# Patient Record
Sex: Female | Born: 1955 | Race: White | Hispanic: No | Smoking: Never smoker
Health system: Southern US, Community
[De-identification: ages and names within clinical notes are randomized; demographics above are authoritative.]

## PROBLEM LIST (undated history)

## (undated) DIAGNOSIS — Z9884 Bariatric surgery status: Secondary | ICD-10-CM

## (undated) DIAGNOSIS — Z8719 Personal history of other diseases of the digestive system: Secondary | ICD-10-CM

## (undated) DIAGNOSIS — N200 Calculus of kidney: Secondary | ICD-10-CM

## (undated) DIAGNOSIS — K219 Gastro-esophageal reflux disease without esophagitis: Secondary | ICD-10-CM

## (undated) DIAGNOSIS — M1990 Unspecified osteoarthritis, unspecified site: Secondary | ICD-10-CM

## (undated) DIAGNOSIS — E039 Hypothyroidism, unspecified: Secondary | ICD-10-CM

## (undated) DIAGNOSIS — Z87442 Personal history of urinary calculi: Secondary | ICD-10-CM

## (undated) DIAGNOSIS — J309 Allergic rhinitis, unspecified: Secondary | ICD-10-CM

## (undated) DIAGNOSIS — Z973 Presence of spectacles and contact lenses: Secondary | ICD-10-CM

## (undated) DIAGNOSIS — J45909 Unspecified asthma, uncomplicated: Secondary | ICD-10-CM

## (undated) DIAGNOSIS — J069 Acute upper respiratory infection, unspecified: Secondary | ICD-10-CM

## (undated) DIAGNOSIS — G43909 Migraine, unspecified, not intractable, without status migrainosus: Secondary | ICD-10-CM

## (undated) DIAGNOSIS — Z9889 Other specified postprocedural states: Secondary | ICD-10-CM

## (undated) HISTORY — PX: TONSILLECTOMY: SUR1361

## (undated) HISTORY — PX: LAPAROSCOPIC GASTRIC RESTRICTIVE DUODENAL PROCEDURE (DUODENAL SWITCH): SHX6667

## (undated) HISTORY — PX: DILATION AND CURETTAGE OF UTERUS: SHX78

## (undated) HISTORY — PX: ANKLE ARTHROSCOPY: SUR85

## (undated) HISTORY — PX: CARPAL TUNNEL RELEASE: SHX101

## (undated) HISTORY — PX: TOTAL HIP ARTHROPLASTY: SHX124

## (undated) HISTORY — DX: Acute upper respiratory infection, unspecified: J06.9

## (undated) HISTORY — PX: BREAST SURGERY: SHX581

---

## 1994-01-21 HISTORY — PX: LAPAROSCOPIC CHOLECYSTECTOMY: SUR755

## 1997-09-05 ENCOUNTER — Other Ambulatory Visit: Admission: RE | Admit: 1997-09-05 | Discharge: 1997-09-05 | Payer: Self-pay | Admitting: *Deleted

## 1997-12-16 ENCOUNTER — Ambulatory Visit (HOSPITAL_COMMUNITY): Admission: RE | Admit: 1997-12-16 | Discharge: 1997-12-16 | Payer: Self-pay | Admitting: *Deleted

## 1997-12-16 ENCOUNTER — Encounter: Payer: Self-pay | Admitting: *Deleted

## 1998-11-23 ENCOUNTER — Encounter: Payer: Self-pay | Admitting: *Deleted

## 1998-11-23 ENCOUNTER — Ambulatory Visit (HOSPITAL_COMMUNITY): Admission: RE | Admit: 1998-11-23 | Discharge: 1998-11-23 | Payer: Self-pay | Admitting: *Deleted

## 1998-11-28 ENCOUNTER — Other Ambulatory Visit: Admission: RE | Admit: 1998-11-28 | Discharge: 1998-11-28 | Payer: Self-pay | Admitting: *Deleted

## 1998-12-18 ENCOUNTER — Encounter (INDEPENDENT_AMBULATORY_CARE_PROVIDER_SITE_OTHER): Payer: Self-pay

## 1998-12-18 ENCOUNTER — Other Ambulatory Visit: Admission: RE | Admit: 1998-12-18 | Discharge: 1998-12-18 | Payer: Self-pay | Admitting: *Deleted

## 1999-12-19 ENCOUNTER — Inpatient Hospital Stay (HOSPITAL_COMMUNITY): Admission: EM | Admit: 1999-12-19 | Discharge: 1999-12-20 | Payer: Self-pay | Admitting: Emergency Medicine

## 1999-12-19 ENCOUNTER — Encounter: Payer: Self-pay | Admitting: Emergency Medicine

## 2000-01-17 ENCOUNTER — Other Ambulatory Visit: Admission: RE | Admit: 2000-01-17 | Discharge: 2000-01-17 | Payer: Self-pay | Admitting: *Deleted

## 2000-12-04 ENCOUNTER — Encounter: Admission: RE | Admit: 2000-12-04 | Discharge: 2000-12-04 | Payer: Self-pay | Admitting: *Deleted

## 2001-01-16 ENCOUNTER — Encounter: Payer: Self-pay | Admitting: *Deleted

## 2001-01-16 ENCOUNTER — Ambulatory Visit (HOSPITAL_COMMUNITY): Admission: RE | Admit: 2001-01-16 | Discharge: 2001-01-16 | Payer: Self-pay | Admitting: *Deleted

## 2001-01-20 ENCOUNTER — Other Ambulatory Visit: Admission: RE | Admit: 2001-01-20 | Discharge: 2001-01-20 | Payer: Self-pay | Admitting: *Deleted

## 2001-01-28 ENCOUNTER — Encounter: Payer: Self-pay | Admitting: Family Medicine

## 2001-01-28 ENCOUNTER — Ambulatory Visit (HOSPITAL_COMMUNITY): Admission: RE | Admit: 2001-01-28 | Discharge: 2001-01-28 | Payer: Self-pay | Admitting: Family Medicine

## 2001-11-24 ENCOUNTER — Encounter: Admission: RE | Admit: 2001-11-24 | Discharge: 2002-02-22 | Payer: Self-pay | Admitting: Family Medicine

## 2002-01-27 ENCOUNTER — Ambulatory Visit (HOSPITAL_BASED_OUTPATIENT_CLINIC_OR_DEPARTMENT_OTHER): Admission: RE | Admit: 2002-01-27 | Discharge: 2002-01-27 | Payer: Self-pay | Admitting: Orthopedic Surgery

## 2002-04-22 ENCOUNTER — Other Ambulatory Visit: Admission: RE | Admit: 2002-04-22 | Discharge: 2002-04-22 | Payer: Self-pay | Admitting: *Deleted

## 2002-05-03 ENCOUNTER — Encounter: Admission: RE | Admit: 2002-05-03 | Discharge: 2002-05-03 | Payer: Self-pay | Admitting: Allergy and Immunology

## 2002-06-30 ENCOUNTER — Encounter: Payer: Self-pay | Admitting: *Deleted

## 2002-06-30 ENCOUNTER — Ambulatory Visit (HOSPITAL_COMMUNITY): Admission: RE | Admit: 2002-06-30 | Discharge: 2002-06-30 | Payer: Self-pay | Admitting: *Deleted

## 2002-07-04 ENCOUNTER — Emergency Department (HOSPITAL_COMMUNITY): Admission: EM | Admit: 2002-07-04 | Discharge: 2002-07-04 | Payer: Self-pay | Admitting: Emergency Medicine

## 2003-08-23 ENCOUNTER — Ambulatory Visit (HOSPITAL_BASED_OUTPATIENT_CLINIC_OR_DEPARTMENT_OTHER): Admission: RE | Admit: 2003-08-23 | Discharge: 2003-08-23 | Payer: Self-pay | Admitting: Orthopedic Surgery

## 2004-04-15 ENCOUNTER — Ambulatory Visit (HOSPITAL_COMMUNITY): Admission: RE | Admit: 2004-04-15 | Discharge: 2004-04-15 | Payer: Self-pay | Admitting: Family Medicine

## 2004-07-11 ENCOUNTER — Ambulatory Visit (HOSPITAL_COMMUNITY): Admission: RE | Admit: 2004-07-11 | Discharge: 2004-07-11 | Payer: Self-pay | Admitting: *Deleted

## 2004-07-19 ENCOUNTER — Other Ambulatory Visit: Admission: RE | Admit: 2004-07-19 | Discharge: 2004-07-19 | Payer: Self-pay | Admitting: *Deleted

## 2005-05-31 ENCOUNTER — Encounter: Admission: RE | Admit: 2005-05-31 | Discharge: 2005-05-31 | Payer: Self-pay | Admitting: Orthopedic Surgery

## 2005-07-16 ENCOUNTER — Ambulatory Visit (HOSPITAL_COMMUNITY): Admission: RE | Admit: 2005-07-16 | Discharge: 2005-07-16 | Payer: Self-pay | Admitting: *Deleted

## 2005-08-29 ENCOUNTER — Other Ambulatory Visit: Admission: RE | Admit: 2005-08-29 | Discharge: 2005-08-29 | Payer: Self-pay | Admitting: *Deleted

## 2006-04-02 ENCOUNTER — Inpatient Hospital Stay (HOSPITAL_COMMUNITY): Admission: RE | Admit: 2006-04-02 | Discharge: 2006-04-07 | Payer: Self-pay | Admitting: Orthopedic Surgery

## 2006-07-22 ENCOUNTER — Ambulatory Visit (HOSPITAL_COMMUNITY): Admission: RE | Admit: 2006-07-22 | Discharge: 2006-07-22 | Payer: Self-pay | Admitting: *Deleted

## 2006-09-04 ENCOUNTER — Other Ambulatory Visit: Admission: RE | Admit: 2006-09-04 | Discharge: 2006-09-04 | Payer: Self-pay | Admitting: *Deleted

## 2007-08-07 ENCOUNTER — Ambulatory Visit (HOSPITAL_COMMUNITY): Admission: RE | Admit: 2007-08-07 | Discharge: 2007-08-07 | Payer: Self-pay | Admitting: Gynecology

## 2007-12-02 ENCOUNTER — Other Ambulatory Visit: Admission: RE | Admit: 2007-12-02 | Discharge: 2007-12-02 | Payer: Self-pay | Admitting: Gynecology

## 2008-08-10 ENCOUNTER — Encounter: Admission: RE | Admit: 2008-08-10 | Discharge: 2008-08-10 | Payer: Self-pay | Admitting: Gynecology

## 2009-07-18 ENCOUNTER — Encounter: Admission: RE | Admit: 2009-07-18 | Discharge: 2009-07-18 | Payer: Self-pay | Admitting: Family Medicine

## 2009-08-11 ENCOUNTER — Ambulatory Visit (HOSPITAL_COMMUNITY): Admission: RE | Admit: 2009-08-11 | Discharge: 2009-08-11 | Payer: Self-pay | Admitting: Gynecology

## 2010-02-11 ENCOUNTER — Encounter: Payer: Self-pay | Admitting: Gynecology

## 2010-02-12 ENCOUNTER — Encounter: Payer: Self-pay | Admitting: Gynecology

## 2010-06-08 NOTE — Op Note (Signed)
NAME:  Rachel [name], Rachel [name]                          ACCOUNT NO.:  [id number]   MEDICAL RECORD NO.:  [id number]                   PATIENT TYPE:  AMB   LOCATION:  DSC                                  FACILITY:  [facility]   PHYSICIAN:  Katy Fitch. Naaman Plummer., M.D.          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   OPERATION:  Release of right transverse carpal ligament.   SURGEON:  Katy Fitch. Sypher, M.D.   ASSISTANT:  None.   ANESTHESIA:  0.25% Marcaine and 2% lidocaine palmar block and median nerve  block supplemented by IV sedation.   SUPERVISING ANESTHESIOLOGIST:  Guadalupe Maple, M.D.   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  She was referred for  evaluation and management of hand pain and numbness.  Clinical examination  revealed signs of carpal tunnel syndrome and electrodiagnostic studies  completed by Dr. Johna Roles confirmed significant median neuropathy.   Due to a failed response with nonoperative measures, she was brought to the  operating room at this time for release of her right transverse carpal  ligament.   DESCRIPTION OF PROCEDURE:  Rachel [name] is brought to the operating room and  placed in the supine position upon the operating table.  Following light  sedation, the right arm is prepped with Betadine soap and solution and  sterilely draped.   Following exsanguination of the limb with an Esmarch bandage.  Arterial  tourniquet is inflated to 260 mmHg.  Marcaine 0.25% and 2% lidocaine were  infiltrated into the path of the intended incision.   When anesthesia was satisfactory, the procedure commenced with a short  incision in the line of the line of the ring finger and the palm.  Subcutaneous tissues are carefully divided on the palmar fascia.  This was  split longitudinally through the __________ branch of the median nerve.  These were followed back to transverse carpal ligament which was gently  isolated at the median nerve.  The ligament was then released along its  ulnar border directly off the hook of the hamate extending into the distal  forearm.   This widely opened the carpal canal.  No masses or other predicaments are  noted.   Bleeding points along the margin at the released ligament were  electrocauterized with bipolar current followed by repair of the skin with  intradermal 3-0 Prolene suture.   A compressive dressing applied with volar plaster splint maintaining the  wrist in 5 degrees of dorsiflexion.   For aftercare, Rachel [name] is given a prescription for Percocet 5 mg one or  two tablets p.o. q.4-6h.  p.r.n. pain 20 tablets without refill.   She will return to the office for follow-up in one week or sooner if any  problems.                                               Katy Fitch Naaman Plummer., M.D.    RVS/MEDQ  D:  08/23/2003  T:  08/23/2003  Job:  [id number]

## 2010-06-08 NOTE — Discharge Summary (Signed)
NAMETOMECA, HELM NO.:  [id number]   MEDICAL RECORD NO.:  [id number]          PATIENT TYPE:  INP   LOCATION:  1504                         FACILITY:  [facility]   PHYSICIAN:  Georges Lynch. Gioffre, M.D.DATE OF BIRTH:  1955/07/16   DATE OF ADMISSION:  04/02/2006  DATE OF DISCHARGE:  04/07/2006                               DISCHARGE SUMMARY   ADMISSION DIAGNOSIS:  1. End-stage osteoarthritis of the left hip with __________ loss of      function.  2. Morbid obesity.  3. History of asthma.  4. __________  5. History of reflux disease.  6. History of thyroid disease.   DISCHARGE DIAGNOSES:  1. Left total hip arthroplasty.  2. Postoperative hypokalemia, with p.o. supplements.  3. Postoperative blood loss anemia.  Asymptomatic, allowed to self      correct with p.o. supplements.  No transfusions.  4. Stable history of asthma.  5. Stable hiatal hernia.  6. Stable reflux disease.  7. Stable thyroid disease.  8. Morbid obesity.   HISTORY OF PRESENT ILLNESS:  The patient is a 55 year old female who has  severe end-stage osteoarthritis with near complete destruction of her  left hip joint.  The patient had significant amount of pain with  ambulation and range of motion.  She is unable to do activities of daily  living at this time due to the pain.  She would like to proceed with  total hip arthroplasty.   ALLERGIES:  PSEUDOEPHEDRINE, CODEINE causes nausea.  PERCOCET causing  itching.  She can barely tolerate VICODIN.   MEDICATIONS ON ADMISSION:  1. Nexium 40 mg a day.  2. Fexofenadine 80 mg a day.  3. Levothyroxine 100 mcg a day.  4. Diclofenac 75 mg a day.  5. Fluticasone nasal spray 50 mcg twice a day.  6. Omega 3 fish oil.  7. Super B complex.  8. Vitamin E 400 international units a day.  9. Black cohosh.  10.Calcium.   SURGICAL PROCEDURES:  On April 02, 2006 the patient was taken to the OR  by Dr. Worthy Rancher. Assisted by Dr. Lajoyce Corners and Oneida Alar,  PA-C.  Under  general anesthesia, the patient underwent a left DePuy total hip  arthroplasty.  Estimated blood loss was 600 mL.  There were no  complications.  The patient had the following components implanted:  A  size 4 standard offset femoral stem, a size 52 acetabular cup,  size 46  Uni femoral ASR implant, a size +5 tapered sleeve adapter.  The patient  tolerated procedure well and was transferred to the recovery room and  then to the orthopedic floor in good condition.   CONSULTATIONS:  The following routine consults requested:  Physical  therapy, case management, pharmacy.   HOSPITAL COURSE:  On April 02, 2006, the patient was admitted to [facility] under the care of  Dr. Worthy Rancher.  The patient was taken  to the OR where a left total hip arthroplasty was performed without any  complications.  The patient was transferred to recovery room and then to  orthopedic floor  in good condition on IV antibiotics, pain medicines,  DVT prophylaxis and total hip protocol.  The patient then incurred a  total of 4 days postoperative course on the orthopedic floor in which  the patient did develop some asymptomatic postoperative blood loss  anemia, but her vital signs remained stable.  The patient tolerated  physical therapy well and was allowed to self correct without any  transfusions with p.o. supplements.  The patient also developed some  slight postoperative hypokalemia.  This was corrected with p.o.  supplements also.  The patient's vital signs remained stable.  She had  no febrile events.  The patient's wound remained benign for any signs of  infection.  Her leg remained vascularly intact.  The patient had some  difficulty with her physical therapy due to her obesity and difficulty  getting around, but she did progress well to the point where on postop  day #4, she was felt to be orthopneically and medically stable, ready  for discharge home.  The patient was able to  transition from IV  medications well to p.o. medications, and her pain was well controlled  on [employer].  The patient was able to be transferred home with  outpatient home health physical therapy arrangements made.   The patient was discharged in good condition to home.   Routine chemistries on admission found sodium of 146, potassium 4.6,  glucose 97, BUN 15 creatinine 0.83.  Her potassium dropped to 3.3 on  March 16.  She had p.o. supplements and the following day she was 3.8.  CBC on admission found to be WBC 6.2, hemoglobin 13.7, hematocrit of  40.9, and her platelets 259.  On March 15, her  H&H was 9.2 and 27.6.  INR on that day was also 1.4.   EKG on admission was normal sinus rhythm at 67 beats per minute.  Chest  x-ray on admission found no active cardiopulmonary disease.   DISCHARGE INSTRUCTIONS:  1. Activity:  The patient is to maintain 50% weightbearing on left      lower extremity with the use of a walker.  2. Diet:  No restrictions.  3. Wound care:  The patient should change dressing daily.  4. Followup:  The patient needs a followup appointment Dr. Darrelyn Hillock 2      weeks from discharge.  The patient is to call [phone].  5. Home health physical therapy and RN is provided by [facility].   DISCHARGE MEDICATIONS:  1. Mepergan Fortis 1 tablet every 4-6 hours p.r.n. pain.  2. Coumadin 5 mg a day.  3. Robaxin 500 mg 1 tablet every 6 hours muscle spasms.  4. Fexofenadine 180 mg a day.  5. Nexium 40 mg a day.  6. Synthroid 100 mcg a day.  7. Flonase spray, once each nostril twice a day as needed.  8. Calcium with vitamin D once every other day.  9. Vitamin B complex.  10.Albuterol inhaler p.r.n.   The patient's condition upon discharge home is listed improved to good.      Jamelle Rushing, P.A.    ______________________________  Georges Lynch Darrelyn Hillock, M.D.    RWK/MEDQ  D:  05/07/2006  T:  05/07/2006  Job:  [id number]   cc:   Windy Fast A. Darrelyn Hillock, M.D.   Fax: [phone]

## 2010-06-08 NOTE — H&P (Signed)
Rachel [name], Rachel [name] NO.:  [id number]   MEDICAL RECORD NO.:  [id number]         PATIENT TYPE:  LINP   LOCATION:                               FACILITY:  [facility]   PHYSICIAN:  Georges Lynch. Gioffre, M.D.DATE OF BIRTH:  03/27/1955   DATE OF ADMISSION:  04/02/2006  DATE OF DISCHARGE:                              HISTORY & PHYSICAL   CHIEF COMPLAINT:  Painful loss range of motion left hip.   HISTORY OF PRESENT ILLNESS:  The patient is 55 year old female here  today for evaluation and preoperative evaluation for her severe left hip  pain.  It has been progressively worsening over the course of time.  She  has noted loss of range of motion.  She cannot put her socks on.  She  has pain with sitting.  She has pain with walking.  She would like to  proceed with a total hip arthroplasty.  X-rays reveal that she has got  end-stage osteoarthritis of the left hip, bone on bone, with deformity  and cystic and sclerotic changes.   ALLERGIES:  1. PSEUDOEPHEDRINE.  2. CODEINE CAUSES NAUSEA.  3. PERCOCET CAUSING ITCHING.  4. SHE CAN BARELY TOLERATE VICODIN.   CURRENT MEDICATIONS:  1. Nexium 40 mg daily.  2. Fexofenadine 180 mg daily.  3. Levothyroxine 100 mcg daily.  4. Diclofenac 75 mg daily.  5. Fluticasone 50 mcg spray twice daily.  6. Omega three fish oil.  7. Super B complex.  8. Vitamin E 400 international units.  9. Black cohosh.  10.Calcium.   PAST MEDICAL HISTORY:  1. Asthma, last severe attack was a year and half ago.  She has never      been hospitalized.  2. Hiatal hernia.  3. Reflux disease.  4. Morbid obesity.  5. Thyroid disease.  6. End-stage osteoarthritis left hip.   REVIEW OF SYSTEMS:  Positive for occasional asthma attack.  Her last  significant event was a year and half previous.  She does have problems  with reflux, but she is well controlled on Nexium.  She has had her  gallbladder removed for problems in the past.  She denies any other  urinary, cardiovascular, neurologic, hematologic, or endocrine issues.   PAST SURGICAL HISTORY:  1. Cholecystectomy.  2. Tonsillectomy.  3. Left ankle and bilateral carpal tunnel releases in the past without      any complications.   FAMILY MEDICAL HISTORY:  Mother is alive with diabetes and hypertension.  Father is alive with coronary artery disease.   SOCIAL HISTORY:  The patient is married.  She currently drives a school  bus.  She does not smoke or use alcohol.  She has 1 child.  She lives in  a [place] house, 6 steps to the main entrance.   PHYSICAL EXAM:  VITAL SIGNS:  Height is 5 feet 7 inches.  Weight is 284  pounds, blood pressure is 142/88, pulse of 84 and regular, respirations  12, patient is afebrile.  GENERAL:  This is a conscious, alert and appropriate 55 year old female.  She does have significant and lower obesity.  She  does walk with a  significant left-sided limp.  She has difficulty sitting on the exam  table and a chair due to discomfort.  HEENT:  Head was normocephalic.  Pupils equal, round and reactive.  Extraocular movements intact.  Oral buccal mucosa is pink and moist.  NECK:  Supple.  No palpable lymphadenopathy.  Thyroid region was  nontender.  She had excellent range of motion without any discomfort.  CHEST:  Lung sounds were clear and equal bilaterally.  No wheezes,  rales, rhonchi.  ABDOMEN:  Obese, soft, nontender.  Bowel sounds present.  EXTREMITIES:  Upper extremities were symmetrically sized and shape.  She  had excellent range of motion of her shoulders, elbows and wrists.  Motor strength was good at 5/5.  LOWER EXTREMITIES:  Right hip had full extension, flexion up to 100  degrees, 20 degrees internal-external rotation without any discomfort.  Left hip:  She was barely able to extend it fully, flexes it up to about  90 degrees with quite a bit of discomfort.  She has no internal  rotation; external rotation is 10 degrees due to mechanical  and  discomfort in the hip.  Both knees have extension.  She can flex them  back to 100 degrees limited by soft tissue.  She has no instability.  The calves are soft, nontender.  The ankles were symmetrical with good  dorsi plantar flexion.  PERIPHERAL VASCULAR:  Carotid pulses were 2+ with no bruits.  Radial  pulses were 2+.  Dorsalis pedis pulses were 1+.  She had no lower  extremity edema or venostasis changes.  NEURO:  The patient was conscious, alert and appropriate, easy  conversation.  Cranial nerves were grossly intact.  She had no gross  neurologic defects noted.  BREAST, RECTAL AND GU:  Deferred at this time.   IMPRESSION:  1. End-stage osteoarthritis left hip with severe pain, loss of motion.  2. Central obesity.  3. Asthma.  4. Hiatal hernia.  5. Reflux disease.  6. Thyroid disease.   PLAN:  The patient has been evaluated by her primary care physician, who  is Dr. Theresia Lo with [facility] practice.  The patient will undergo  all routine labs and tests prior to having a left total hip arthroplasty  by Dr. Darrelyn Hillock on March 12.  The patient has no other questions.  The  patient would like to proceed.      Jamelle Rushing, P.A.    ______________________________  Georges Lynch Darrelyn Hillock, M.D.    RWK/MEDQ  D:  03/17/2006  T:  03/17/2006  Job:  [id number]

## 2010-06-08 NOTE — Consult Note (Signed)
. [facility]  Patient:    Rachel [name], Rachel [name]                       MRN: [id number] Proc. Date: 12/20/99 Adm. Date:  [id number] Attending:  Lanell Persons                          Consultation Report  CHIEF COMPLAINT:  Chest pain.  HISTORY OF PRESENT ILLNESS:  This is a 55 year old morbidly obese white female with no cardiac history in the past who presents with complaints of substernal chest pain.  She noted the onset of chest pressure yesterday evening, November 28.  She thought that it was like indigestion and took a Tums and went to bed.  She had no problems sleeping during the night, but when she awoke, she developed sharp, shooting fleeting pains across her chest, which would last for seconds and resolve.  This occurred intermittently all day. Around three oclock she then developed some chest pressure which was constant for at least 5-6 hours.  She came to the emergency room for further evaluation.  On arrival EKG was within normal limits.  She was given a sublingual nitroglycerin with questionable mild improvement in her pain.  She had had an episode of chest pain seven years ago and underwent exercise treadmill testing which was negative and was told that she had a hiatal hernia.  She has also complained of some numbness in the back of her head and scalp on the left side.  Of note she fell down the stairs this past Sunday. She has no associated symptoms of nausea, vomiting, diaphoresis, shortness of breath, dizziness, or palpitations.  Cardiac risk factors include morbid obesity.  She does have a family history of coronary disease, but no history of MIs under age 69.  PAST MEDICAL HISTORY:  Significant for gastroesophageal reflux and hypothyroidism.  She is status post cholecystectomy and tonsillectomy.  ALLERGIES:  ASPIRIN.  She has a GI intolerance.  FAMILY HISTORY:  Positive for hypertension.  Her father had an MI at 2.  SOCIAL HISTORY:   She is a school bus driver.  She denies any tobacco or alcohol use.  MEDICATIONS:  Synthroid, Vioxx, Nexium, which she is now on for six months, and Zyrtec q.d.  REVIEW OF SYSTEMS:  As stated in the HPI, otherwise negative.  PHYSICAL EXAMINATION:  VITAL SIGNS:  Blood pressure is 110/60, heart rate 72.  GENERAL:  Generally this is a well-developed, well-nourished, morbidly obese white female in no acute distress.  HEENT:  Pupils are equal, round, reactive to light and accommodation. Extraocular muscles are intact bilaterally.  There is no scleral icterus.  NECK:  Supple without lymphadenopathy, no masses.  LUNGS:  Clear to auscultation bilaterally, no wheezes, rales, or rhonchi.  HEART:  Regular rate and rhythm, no murmurs, rubs, or gallops, normal S1, S2. Carotid obstruction, +2 bilateral bruits.  ABDOMEN:  Soft, nontender, nondistended with active bowel sounds, very obese. No palpable masses, although difficult exam.  No bruits.  EXTREMITIES:  No cyanosis, erythema, or edema.  Good distal pulses bilaterally.  NEUROLOGIC:  Alert and oriented x 3.  LABORATORY DATA:  CPKs 262 and 210 with MBs of 3.2 and 2.3.  Troponins are 0.01 x 2.  EKG shows normal sinus rhythm, no ST/T-wave abnormalities.  ASSESSMENT:  Atypical chest pain with  no significant cardiac risk factors except for family history after age 46 and  morbid obesity.  EKG is unremarkable.  She did fall down the steps earlier this week and she does have some chest wall tenderness to palpation, although her chest pain is somewhat different in quality.  She does have a history of a hiatal hernia with reflux. She had more than five hours of constant chest discomfort with negative enzymes and negative EKG.  Recommend proceeding with GI workup first.  If she continues to have chest pain with all other workup negative, then we will consider adenosine Cardiolite, although very unlikely for cardiac chest pain.  Will plan  on discharging her home.  I will be glad to see her back in the office as an outpatient if she needs further workup. DD:  12/20/99 TD:  12/20/99 Job: [id number]

## 2010-06-08 NOTE — Op Note (Signed)
NAMEENGLAND, GREB NO.:  [id number]   MEDICAL RECORD NO.:  [id number]          PATIENT TYPE:  INP   LOCATION:  1504                         FACILITY:  [facility]   PHYSICIAN:  Georges Lynch. Gioffre, M.D.DATE OF BIRTH:  03/13/1955   DATE OF PROCEDURE:  04/02/2006  DATE OF DISCHARGE:                               OPERATIVE REPORT   OPERATION:  Left total hip arthroplasty utilizing the DePuy system.  I  utilized a size 4 femoral component, it was a Summit femoral component.  The cup was an ASR 52 mm diameter and was a metal-on-metal. The C  tapered head was a +5. The stem was a standard stem.  The femoral stem  with a standard stem.   PROCEDURE:  Under general anesthesia, routine orthopedic prepping and  draping of the left hip was carried out.  The patient was on her right  side left side up.  Note, the patient was quite obese so we took a  painstaking amount of time to pad all areas to avoid any pressure sores.  A posterolateral approach to the hip was carried out after she had 2  grams of IV Ancef.  Bleeders were identified and cauterized.  Self-  retaining retractors were inserted.  Following that, I identified the  greater trochanter and an incision was made along the iliotibial band.  At this time, the self-retaining retractors were advanced.  I then made  an incision in the capsule and did a partial capsulectomy.  I then  dislocated the femoral head.  I utilized the appropriate femoral neck  cutting guide and cut the neck at the appropriate length.  Following  that, I then utilized my box chisel and made an initial opening in the  femoral neck.  I then utilized my appropriate reamers and reamed and  then rasped to a size 4 femoral component.  Following that, we then  reamed the acetabulum up the appropriate diameter for a 52 mm cup.  We  inserted our permanent ASR cup.  We had a nice snug fit of the cup.  Following that, we went through trials with various sizes of  C-tapered  head and finally selected a +5 C tapered head.  At that time, we reduced  the hip and had excellent function of the hip.  We measured our leg  length, once again, and had excellent leg length measurements.  We took  the hip through various ranges of motion and it had good stability.  I  thoroughly irrigated out the hip, reapproximated the soft tissue  structures in the usual fashion.  The skin was closed with metal  staples.  A sterile Neosporin dressing was applied.   SURGEON:  Georges Lynch. Darrelyn Hillock, M.D.   ASSISTANT:  Madlyn Frankel. Charlann Boxer, M.D.  Jamelle Rushing, P.A.-C.           ______________________________  Georges Lynch Darrelyn Hillock, M.D.     RAG/MEDQ  D:  04/03/2006  T:  04/04/2006  Job:  [id number]

## 2010-06-08 NOTE — Op Note (Signed)
NAME:  Rachel [name], DOUGHTY                          ACCOUNT NO.:  [id number]   MEDICAL RECORD NO.:  [id number]                   PATIENT TYPE:  AMB   LOCATION:  DSC                                  FACILITY:  [facility]   PHYSICIAN:  Feliberto Gottron. Turner Daniels, M.D.                DATE OF BIRTH:  05/18/1955   DATE OF PROCEDURE:  DATE OF DISCHARGE:                                 OPERATIVE REPORT   PREOPERATIVE DIAGNOSIS:  Left ankle pain with negative x-ray and MRI scans,  persistent.   POSTOPERATIVE DIAGNOSIS:  Left ankle partial thickness anterior talofibular  ligament tear and anteromedial fibrous bands.   PROCEDURE:  Left ankle arthroscopic removal of anteromedial fibrous band  that was slipping in and out of the joint as well as partial thickness  anterior talofibular ligament tear.   SURGEON:  Dr. Gean Birchwood.   FIRST ASSISTANT:  Berle Mull, P.A.-C.   ANESTHETIC:  General endotracheal.   ESTIMATED BLOOD LOSS:  Minimal.   FLUIDS REPLACED:  800 cc of crystalloid.   INDICATIONS FOR PROCEDURE:  55 year old woman who injured her left ankle,  has been treated conservative with anti-inflammatory medicines, physical  therapy.  X-rays were normal.  MRI scan was normal but has had persistent  pain.  Got good temporary relief from a cortisone injection and now desires  elective arthroscopic evaluation and treatment of her left ankle.   DESCRIPTION OF PROCEDURE:  Patient identified by arm band, taken to the  operating room at [facility]. Appropriate anesthetic monitors  were attached and general endotracheal anesthesia induced with the patient  in the supine position.  Tourniquet applied to the left calf but never used.  The left lower extremity was prepped and draped in the usual sterile fashion  from the toes to the tourniquet.  We began the procedure by using an  anterolateral approach and injecting the ankle joint with 20 cc of 0.5%  Marcaine and epinephrine solution.  Using a  #15 blade, standard anteromedial  and anterolateral peripatellar portals were then made allowing introduction  of the arthroscope to the anterolateral portal, and a 2.9 Gray-White sucker-  shaver to the anteromedial portal.  We immediately identified the  anteromedial fibrous band that was torn and slipping in and out of the  joint, photographed it and then removed it with a 2.9 Gray-White sucker-  shaver.  On the lateral side, we found ATFL partial thickness tear that was  likewise debrided.  We then evaluated the gutters and the posterior  compartment arthroscopically, and  found them to be in good condition along with the articular cartilage.  The  ankle was washed out with normal saline solution.  The arthroscopic  instruments removed, and dressed with Xeroform, 4 x 4 dressings, sponges,  Webril and an Ace wrap applied.  The patient was then awakened and taken to  the recovery room without difficulty,.  Feliberto Gottron. Turner Daniels, M.D.    Ovid Curd  D:  01/27/2002  T:  01/27/2002  Job:  [id number]

## 2010-10-02 ENCOUNTER — Emergency Department (HOSPITAL_COMMUNITY)
Admission: EM | Admit: 2010-10-02 | Discharge: 2010-10-03 | Disposition: A | Discharge status: Home / Self Care | Payer: BC Managed Care – PPO | Attending: Emergency Medicine | Admitting: Emergency Medicine

## 2010-10-02 DIAGNOSIS — E039 Hypothyroidism, unspecified: Secondary | ICD-10-CM | POA: Insufficient documentation

## 2010-10-02 DIAGNOSIS — E669 Obesity, unspecified: Secondary | ICD-10-CM | POA: Insufficient documentation

## 2010-10-02 DIAGNOSIS — R609 Edema, unspecified: Secondary | ICD-10-CM | POA: Insufficient documentation

## 2010-10-02 DIAGNOSIS — Z79899 Other long term (current) drug therapy: Secondary | ICD-10-CM | POA: Insufficient documentation

## 2010-10-02 DIAGNOSIS — R079 Chest pain, unspecified: Secondary | ICD-10-CM | POA: Insufficient documentation

## 2010-10-02 DIAGNOSIS — Z7982 Long term (current) use of aspirin: Secondary | ICD-10-CM | POA: Insufficient documentation

## 2010-10-02 DIAGNOSIS — M25519 Pain in unspecified shoulder: Secondary | ICD-10-CM | POA: Insufficient documentation

## 2010-10-03 ENCOUNTER — Emergency Department (HOSPITAL_COMMUNITY): Payer: BC Managed Care – PPO

## 2010-10-03 LAB — TROPONIN I: Troponin I: <0.3 ng/mL (ref <0.30)

## 2010-10-03 LAB — BASIC METABOLIC PANEL
BUN: 18 mg/dL (ref 6–23)
Calcium: 9.9 mg/dL (ref 8.4–10.5)
Creatinine, Ser: 0.7 mg/dL (ref 0.50–1.10)
GFR calc Af Amer: >60 mL/min (ref >60)
Glucose, Bld: 89 mg/dL (ref 70–99)

## 2010-10-03 LAB — CBC
MCH: 29.8 pg (ref 26.0–34.0)
MCHC: 34.2 g/dL (ref 30.0–36.0)
Platelets: 174 10*3/uL (ref 150–400)
RDW: 13.3 % (ref 11.5–15.5)
WBC: 7.9 10*3/uL (ref 4.0–10.5)

## 2010-10-03 LAB — POCT I-STAT TROPONIN I: Troponin i, poc: 0 ng/mL (ref 0.00–0.08)

## 2012-09-02 ENCOUNTER — Other Ambulatory Visit: Payer: Self-pay | Admitting: Gynecology

## 2012-09-02 DIAGNOSIS — R928 Other abnormal and inconclusive findings on diagnostic imaging of breast: Secondary | ICD-10-CM

## 2012-09-17 ENCOUNTER — Ambulatory Visit
Admission: RE | Admit: 2012-09-17 | Discharge: 2012-09-17 | Disposition: A | Discharge status: Home / Self Care | Payer: BC Managed Care – PPO | Source: Ambulatory Visit | Attending: Gynecology | Admitting: Gynecology

## 2012-09-17 DIAGNOSIS — R928 Other abnormal and inconclusive findings on diagnostic imaging of breast: Secondary | ICD-10-CM

## 2012-09-29 ENCOUNTER — Other Ambulatory Visit: Payer: Self-pay | Admitting: Occupational Medicine

## 2012-09-29 ENCOUNTER — Ambulatory Visit
Admission: RE | Admit: 2012-09-29 | Discharge: 2012-09-29 | Disposition: A | Discharge status: Home / Self Care | Payer: Self-pay | Source: Ambulatory Visit | Attending: Occupational Medicine | Admitting: Occupational Medicine

## 2012-09-29 DIAGNOSIS — T148XXA Other injury of unspecified body region, initial encounter: Secondary | ICD-10-CM

## 2012-09-29 DIAGNOSIS — W19XXXA Unspecified fall, initial encounter: Secondary | ICD-10-CM

## 2012-11-10 ENCOUNTER — Ambulatory Visit
Admission: RE | Admit: 2012-11-10 | Discharge: 2012-11-10 | Disposition: A | Discharge status: Home / Self Care | Payer: Worker's Compensation | Source: Ambulatory Visit | Attending: Orthopedic Surgery | Admitting: Orthopedic Surgery

## 2012-11-10 ENCOUNTER — Ambulatory Visit
Admission: RE | Admit: 2012-11-10 | Discharge: 2012-11-10 | Disposition: A | Discharge status: Home / Self Care | Payer: Self-pay | Source: Ambulatory Visit | Attending: Orthopedic Surgery | Admitting: Orthopedic Surgery

## 2012-11-10 ENCOUNTER — Other Ambulatory Visit: Payer: Self-pay | Admitting: Orthopedic Surgery

## 2012-11-10 DIAGNOSIS — R52 Pain, unspecified: Secondary | ICD-10-CM

## 2012-11-10 DIAGNOSIS — R200 Anesthesia of skin: Secondary | ICD-10-CM

## 2013-01-18 ENCOUNTER — Other Ambulatory Visit: Payer: Self-pay | Admitting: Gynecology

## 2013-01-18 DIAGNOSIS — N6320 Unspecified lump in the left breast, unspecified quadrant: Secondary | ICD-10-CM

## 2013-01-22 ENCOUNTER — Ambulatory Visit
Admission: RE | Admit: 2013-01-22 | Discharge: 2013-01-22 | Disposition: A | Discharge status: Home / Self Care | Payer: BC Managed Care – PPO | Source: Ambulatory Visit | Attending: Gynecology | Admitting: Gynecology

## 2013-01-22 DIAGNOSIS — N6320 Unspecified lump in the left breast, unspecified quadrant: Secondary | ICD-10-CM

## 2013-06-01 ENCOUNTER — Encounter (HOSPITAL_COMMUNITY): Payer: Self-pay | Admitting: Pharmacy Technician

## 2013-06-01 ENCOUNTER — Other Ambulatory Visit: Payer: Self-pay | Admitting: Surgical

## 2013-06-02 NOTE — Patient Instructions (Signed)
Rachel [name]  06/02/2013   Your procedure is scheduled on:  06/08/2013  1230pm-130pm  [facility] at    1030  AM.  Call this number if you have problems the morning of surgery: [phone]   Remember:   Do not eat food or drink liquids after midnight.   Take these medicines the morning of surgery with A SIP OF WATER:    Do not wear jewelry, make-up or nail polish.  Do not wear lotions, powders, or perfumes.  Do not shave 48 hours prior to surgery.   Do not bring valuables to the hospital.  Contacts, dentures or bridgework may not be worn into surgery.       Patients discharged the day of surgery will not be allowed to drive  home.  Name and phone number of your driver:      Please read over the following fact sheets that you were given: [facility] - Preparing [facility] Before surgery, you can play an important role.  Because skin is not sterile, your skin needs to be as free of germs as possible.  You can reduce the number of germs on your skin by washing with CHG (chlorahexidine gluconate) soap before surgery.  CHG is an antiseptic cleaner which kills germs and bonds with the skin to continue killing germs even after washing. Please DO NOT use if you have an allergy to CHG or antibacterial soaps.  If your skin becomes reddened/irritated stop using the CHG and inform your nurse when you arrive at Short Stay. Do not shave (including legs and underarms) for at least 48 hours prior to the first CHG shower.  You may shave your face. Please follow these instructions carefully:  1.  Shower with CHG Soap the night before surgery and the  morning of Surgery.  2.  If you choose to wash your hair, wash your hair first as usual with your  normal  shampoo.  3.  After you shampoo, rinse your hair and body thoroughly to remove the  shampoo.                           4.  Use CHG as you would any other liquid soap.  You can apply chg directly  to the skin and wash                 Gently with a scrungie or clean washcloth.  5.  Apply the CHG Soap to your body ONLY FROM THE NECK DOWN.   Do not use on open                           Wound or open sores. Avoid contact with eyes, ears mouth and genitals (private parts).                        Genitals (private parts) with your normal soap.             6.  Wash thoroughly, paying special attention to the area where your surgery  will be performed.  7.  Thoroughly rinse your body with warm water from the neck down.  8.  DO NOT shower/wash with your normal soap after using and rinsing off  the CHG Soap.                9.  Pat yourself  dry with a clean towel.            10.  Wear clean pajamas.            11.  Place clean sheets on your bed the night of your first shower and do not  sleep with pets. Day of Surgery : Do not apply any lotions/deodorants the morning of surgery.  Please wear clean clothes to the hospital/surgery center.  FAILURE TO FOLLOW THESE INSTRUCTIONS MAY RESULT IN THE CANCELLATION OF YOUR SURGERY PATIENT SIGNATURE_________________________________  NURSE SIGNATURE__________________________________  ________________________________________________________________________  coughing and deep breathing exercises, leg exercises

## 2013-06-02 NOTE — H&P (Signed)
Rachel [name] is an 58 y.o. female.   Chief Complaint: right knee pain HPI: Rachel [name] presented to the office with the chief complaint of right knee pain following an injury at work. 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 She went to the ED a couple of weeks later. MRI was ordered which showed a medial meniscus tear. She has failed conservative treatments including activity modification, cortisone injection, and PT. She continues to have pain, locking, and swelling of the right knee.    Past Surgical History Breast Mass; Local Excision. bilateral Breast Biopsy. bilateral Ankle Surgery. left Foot Surgery. left Dilation and Curettage of Uterus - Multiple Carpal Tunnel Repair. bilateral Cholecystectomy Carpal Tunnel Surgery - Both Breast Cancer Lump Removal - Both Total Hip Replacement - Left Arthroscopic Ankle Surgery - Left Total Hip Replacement. left Tonsillectomy Gallbladder Surgery. laporoscopic  Past Medical History Migraine Headache Hypothyroidism High blood pressure Unspecified Diagnosis Gastroesophageal Reflux Disease Chronic Pain Asthma   Social History Marital status. married Living situation. live with spouse Illicit drug use. no Current work status. working full time Children. 1 Alcohol use. Occasional alcohol use. current drinker; drinks wine; only occasionally per week Pain Contract. no Number of flights of stairs before winded. 1 Tobacco use. Never smoker. never smoker  Medication History CeleBREX (200MG  Capsule, 1 (one) Capsule Oral daily,  Keflex (500MG  Capsule, 1 (one) Capsule Oral four times daily, Active. EpiPen 2-Pak (0.3MG /0.3ML Soln Auto-inj, Injection) Active. Fluticasone Propionate (50MCG/ACT Suspension, Nasal) Active. ProAir HFA (108 (90 Base)MCG/ACT Aerosol Soln, Inhalation) Active. Requip ( Oral) Specific dose unknown - Active. Aspirin (81MG  Tablet,  1 (one) Oral) Active. Pramipexole Dihydrochloride (0.125MG  Tablet, Oral) Active. Vitamin D (1 (one) Oral) Specific dose unknown - Active. Allegra Allergy (180MG  Tablet, Oral) Active. Atenolol (100MG  Tablet, Oral) Active. Black Cohosh Root (540MG  Capsule, 1 Oral) Active. Levothyroxine Sodium ( Oral) Specific dose unknown - Active. Multiple Vitamins/Womens (1 Oral) Active. Omeprazole Magnesium (20.6 (20 Base)MG Capsule DR, Oral) Active.  Allergies:  Allergies  Allergen Reactions  . Guaifenesin & Derivatives Anaphylaxis  . Codeine Itching    Review of Systems  Constitutional: Negative.   HENT: Negative.   Eyes: Negative.   Respiratory: Negative.   Cardiovascular: Negative.   Gastrointestinal: Negative.   Genitourinary: Negative.   Musculoskeletal: Positive for back pain, falls, joint pain and myalgias. Negative for neck pain.       Right knee pain  Skin: Negative.   Neurological: Negative.   Endo/Heme/Allergies: Negative.   Psychiatric/Behavioral: Negative.    Vitals Weight: 330 lb Height: 67 in Body Surface Area: 2.66 m Body Mass Index: 51.68 kg/m BP: 130/86 (Sitting, Left Arm, Large)  HR: 88  Physical Exam  Constitutional: She is oriented to person, place, and time. She appears well-developed. No distress.  Morbidly obese  HENT:  Head: Normocephalic and atraumatic.  Right Ear: External ear normal.  Left Ear: External ear normal.  Nose: Nose normal.  Mouth/Throat: Oropharynx is clear and moist.  Eyes: Conjunctivae and EOM are normal.  Neck: Normal range of motion. Neck supple.  Cardiovascular: Normal rate, regular rhythm, normal heart sounds and intact distal pulses.   No murmur heard. Respiratory: Effort normal and breath sounds normal. No respiratory distress. She has no wheezes.  GI: Soft. Bowel sounds are normal. She exhibits no distension. There is no tenderness.  Musculoskeletal:       Right hip: Normal.  Left hip: Normal.       Right knee:  She exhibits decreased range of motion and swelling. She exhibits no effusion and no erythema. Tenderness found. Medial joint line and lateral joint line tenderness noted.       Left knee: Normal.       Right lower leg: She exhibits no tenderness and no swelling.       Left lower leg: She exhibits no tenderness and no swelling.  She does have pain with passive and active range of motion of the right knee. She is able to fully extend. Flexion back 115 degrees with discomfort. She is tender along the medial and lateral joint line. Mild soft tissue swelling. No effusion. No instability.  Neurological: She is alert and oriented to person, place, and time. She has normal strength and normal reflexes. No sensory deficit.  Skin: No rash noted. She is not diaphoretic. No erythema.  Psychiatric: She has a normal mood and affect. Her behavior is normal.     Assessment/Plan Right knee pain She has a medial meniscus tear in the right knee. She has failed conservative treatment including activity modification, cortisone injections, and PT. She needs a right knee arthroscopy with medial menisectomy. Risks and benefits of the procedure were discussed with the patient by Dr. Darrelyn HillockGioffre. This will be an outpatient procedure.   Rachel [name] 06/02/2013, 7:49 AM

## 2013-06-03 ENCOUNTER — Encounter (HOSPITAL_COMMUNITY)
Admission: RE | Admit: 2013-06-03 | Discharge: 2013-06-03 | Disposition: A | Discharge status: Home / Self Care | Payer: Worker's Compensation | Source: Ambulatory Visit | Attending: Orthopedic Surgery | Admitting: Orthopedic Surgery

## 2013-06-03 ENCOUNTER — Ambulatory Visit (HOSPITAL_COMMUNITY)
Admission: RE | Admit: 2013-06-03 | Discharge: 2013-06-03 | Disposition: A | Discharge status: Home / Self Care | Payer: Worker's Compensation | Source: Ambulatory Visit | Attending: Anesthesiology | Admitting: Anesthesiology

## 2013-06-03 ENCOUNTER — Encounter (HOSPITAL_COMMUNITY): Payer: Self-pay

## 2013-06-03 DIAGNOSIS — M47814 Spondylosis without myelopathy or radiculopathy, thoracic region: Secondary | ICD-10-CM | POA: Insufficient documentation

## 2013-06-03 DIAGNOSIS — Z01812 Encounter for preprocedural laboratory examination: Secondary | ICD-10-CM | POA: Diagnosis not present

## 2013-06-03 DIAGNOSIS — Z01818 Encounter for other preprocedural examination: Secondary | ICD-10-CM | POA: Diagnosis present

## 2013-06-03 DIAGNOSIS — Z01810 Encounter for preprocedural cardiovascular examination: Secondary | ICD-10-CM | POA: Insufficient documentation

## 2013-06-03 HISTORY — DX: Hypothyroidism, unspecified: E03.9

## 2013-06-03 HISTORY — DX: Gastro-esophageal reflux disease without esophagitis: K21.9

## 2013-06-03 LAB — BASIC METABOLIC PANEL
BUN: 17 mg/dL (ref 6–23)
CO2: 29 mEq/L (ref 19–32)
CREATININE: 0.73 mg/dL (ref 0.50–1.10)
Calcium: 10.1 mg/dL (ref 8.4–10.5)
Chloride: 105 mEq/L (ref 96–112)
GFR calc non Af Amer: >90 mL/min (ref >90)
Glucose, Bld: 121 mg/dL — ABNORMAL HIGH (ref 70–99)
POTASSIUM: 4.7 meq/L (ref 3.7–5.3)
Sodium: 144 mEq/L (ref 137–147)

## 2013-06-03 LAB — CBC
HEMATOCRIT: 43.7 % (ref 36.0–46.0)
Hemoglobin: 14.4 g/dL (ref 12.0–15.0)
MCH: 29.9 pg (ref 26.0–34.0)
MCHC: 33 g/dL (ref 30.0–36.0)
MCV: 90.9 fL (ref 78.0–100.0)
Platelets: 181 10*3/uL (ref 150–400)
RBC: 4.81 MIL/uL (ref 3.87–5.11)
RDW: 13.7 % (ref 11.5–15.5)
WBC: 5.7 10*3/uL (ref 4.0–10.5)

## 2013-06-07 NOTE — Progress Notes (Addendum)
Dr Council Mechanicenenny aware of ekg results on 06/04/2013.    Dr Council Mechanicenenny states anesthesia will evaluate patient day of surgery.

## 2013-06-08 ENCOUNTER — Ambulatory Visit (HOSPITAL_COMMUNITY)
Admission: RE | Admit: 2013-06-08 | Discharge: 2013-06-08 | Disposition: A | Discharge status: Home / Self Care | Payer: Worker's Compensation | Source: Ambulatory Visit | Attending: Orthopedic Surgery | Admitting: Orthopedic Surgery

## 2013-06-08 ENCOUNTER — Encounter (HOSPITAL_COMMUNITY)
Admission: RE | Disposition: A | Discharge status: Home / Self Care | Payer: Self-pay | Source: Ambulatory Visit | Attending: Orthopedic Surgery

## 2013-06-08 ENCOUNTER — Encounter (HOSPITAL_COMMUNITY): Payer: Self-pay

## 2013-06-08 ENCOUNTER — Ambulatory Visit (HOSPITAL_COMMUNITY): Payer: Worker's Compensation | Admitting: Anesthesiology

## 2013-06-08 ENCOUNTER — Encounter (HOSPITAL_COMMUNITY): Payer: Worker's Compensation | Admitting: Anesthesiology

## 2013-06-08 DIAGNOSIS — K219 Gastro-esophageal reflux disease without esophagitis: Secondary | ICD-10-CM | POA: Insufficient documentation

## 2013-06-08 DIAGNOSIS — M659 Unspecified synovitis and tenosynovitis, unspecified site: Secondary | ICD-10-CM | POA: Insufficient documentation

## 2013-06-08 DIAGNOSIS — J45909 Unspecified asthma, uncomplicated: Secondary | ICD-10-CM | POA: Insufficient documentation

## 2013-06-08 DIAGNOSIS — G43909 Migraine, unspecified, not intractable, without status migrainosus: Secondary | ICD-10-CM | POA: Insufficient documentation

## 2013-06-08 DIAGNOSIS — Z96649 Presence of unspecified artificial hip joint: Secondary | ICD-10-CM | POA: Insufficient documentation

## 2013-06-08 DIAGNOSIS — M1711 Unilateral primary osteoarthritis, right knee: Secondary | ICD-10-CM | POA: Diagnosis present

## 2013-06-08 DIAGNOSIS — S83289A Other tear of lateral meniscus, current injury, unspecified knee, initial encounter: Secondary | ICD-10-CM | POA: Diagnosis present

## 2013-06-08 DIAGNOSIS — M1710 Unilateral primary osteoarthritis, unspecified knee: Secondary | ICD-10-CM | POA: Insufficient documentation

## 2013-06-08 DIAGNOSIS — Z6841 Body Mass Index (BMI) 40.0 and over, adult: Secondary | ICD-10-CM | POA: Insufficient documentation

## 2013-06-08 DIAGNOSIS — I10 Essential (primary) hypertension: Secondary | ICD-10-CM | POA: Insufficient documentation

## 2013-06-08 DIAGNOSIS — E039 Hypothyroidism, unspecified: Secondary | ICD-10-CM | POA: Insufficient documentation

## 2013-06-08 HISTORY — PX: KNEE ARTHROSCOPY WITH MEDIAL MENISECTOMY: SHX5651

## 2013-06-08 SURGERY — ARTHROSCOPY, KNEE, WITH MEDIAL MENISCECTOMY
Anesthesia: General | Site: Knee | Laterality: Right

## 2013-06-08 MED ORDER — BUPIVACAINE-EPINEPHRINE (PF) 0.25% -1:200000 IJ SOLN
INTRAMUSCULAR | Status: AC
  Filled 2013-06-08: qty 30

## 2013-06-08 MED ORDER — HYDROMORPHONE HCL 2 MG PO TABS: 2.0000 mg | ORAL_TABLET | ORAL | Status: DC | PRN

## 2013-06-08 MED ORDER — PROPOFOL 10 MG/ML IV BOLUS
INTRAVENOUS | Status: AC
  Filled 2013-06-08: qty 20

## 2013-06-08 MED ORDER — DEXAMETHASONE SODIUM PHOSPHATE 10 MG/ML IJ SOLN
INTRAMUSCULAR | Status: DC | PRN
  Administered 2013-06-08: 10 mg via INTRAVENOUS

## 2013-06-08 MED ORDER — PROPOFOL 10 MG/ML IV EMUL
INTRAVENOUS | Status: DC | PRN
  Administered 2013-06-08: 200 mg via INTRAVENOUS

## 2013-06-08 MED ORDER — BACITRACIN ZINC 500 UNIT/GM EX OINT
TOPICAL_OINTMENT | CUTANEOUS | Status: AC
  Filled 2013-06-08: qty 28.35

## 2013-06-08 MED ORDER — FENTANYL CITRATE 0.05 MG/ML IJ SOLN
INTRAMUSCULAR | Status: DC | PRN
  Administered 2013-06-08 (×5): 25 ug via INTRAVENOUS
  Administered 2013-06-08: 50 ug via INTRAVENOUS
  Administered 2013-06-08: 25 ug via INTRAVENOUS

## 2013-06-08 MED ORDER — BACITRACIN ZINC 500 UNIT/GM EX OINT
TOPICAL_OINTMENT | CUTANEOUS | Status: DC | PRN
  Administered 2013-06-08: 1 via TOPICAL

## 2013-06-08 MED ORDER — LACTATED RINGERS IV SOLN
INTRAVENOUS | Status: DC
  Administered 2013-06-08: 1000 mL via INTRAVENOUS

## 2013-06-08 MED ORDER — SODIUM CHLORIDE 0.9 % IR SOLN
Status: DC | PRN
  Administered 2013-06-08 (×2): 3000 mL

## 2013-06-08 MED ORDER — DEXTROSE 5 % IV SOLN
3.0000 g | INTRAVENOUS | Status: DC
  Filled 2013-06-08: qty 3000

## 2013-06-08 MED ORDER — LACTATED RINGERS IV SOLN: INTRAVENOUS | Status: DC

## 2013-06-08 MED ORDER — ONDANSETRON HCL 4 MG/2ML IJ SOLN
INTRAMUSCULAR | Status: DC | PRN
  Administered 2013-06-08: 4 mg via INTRAVENOUS

## 2013-06-08 MED ORDER — HYDROMORPHONE HCL PF 1 MG/ML IJ SOLN: 0.2500 mg | INTRAMUSCULAR | Status: DC | PRN

## 2013-06-08 MED ORDER — FENTANYL CITRATE 0.05 MG/ML IJ SOLN
INTRAMUSCULAR | Status: AC
  Filled 2013-06-08: qty 2

## 2013-06-08 MED ORDER — MIDAZOLAM HCL 5 MG/5ML IJ SOLN
INTRAMUSCULAR | Status: DC | PRN
  Administered 2013-06-08: 2 mg via INTRAVENOUS

## 2013-06-08 MED ORDER — DEXAMETHASONE SODIUM PHOSPHATE 10 MG/ML IJ SOLN
INTRAMUSCULAR | Status: AC
  Filled 2013-06-08: qty 1

## 2013-06-08 MED ORDER — PROMETHAZINE HCL 25 MG/ML IJ SOLN
6.2500 mg | INTRAMUSCULAR | Status: DC | PRN
Start: 2013-06-08 — End: 2013-06-08

## 2013-06-08 MED ORDER — HYDROMORPHONE HCL 2 MG PO TABS
2.0000 mg | ORAL_TABLET | Freq: Once | ORAL | Status: AC
  Administered 2013-06-08: 2 mg via ORAL
  Filled 2013-06-08: qty 1

## 2013-06-08 MED ORDER — LACTATED RINGERS IV SOLN
INTRAVENOUS | Status: DC | PRN
  Administered 2013-06-08 (×2): via INTRAVENOUS

## 2013-06-08 MED ORDER — ONDANSETRON HCL 4 MG/2ML IJ SOLN
INTRAMUSCULAR | Status: AC
  Filled 2013-06-08: qty 2

## 2013-06-08 MED ORDER — MIDAZOLAM HCL 2 MG/2ML IJ SOLN
INTRAMUSCULAR | Status: AC
  Filled 2013-06-08: qty 2

## 2013-06-08 MED ORDER — BUPIVACAINE-EPINEPHRINE 0.25% -1:200000 IJ SOLN
INTRAMUSCULAR | Status: DC | PRN
  Administered 2013-06-08: 30 mL

## 2013-06-08 SURGICAL SUPPLY — 26 items
BANDAGE ELASTIC 4 VELCRO ST LF (GAUZE/BANDAGES/DRESSINGS) ×2 IMPLANT
BANDAGE ELASTIC 6 VELCRO ST LF (GAUZE/BANDAGES/DRESSINGS) ×2 IMPLANT
BLADE GREAT WHITE 4.2 (BLADE) ×2 IMPLANT
BNDG COHESIVE 6X5 TAN STRL LF (GAUZE/BANDAGES/DRESSINGS) ×2 IMPLANT
BNDG GAUZE ELAST 4 BULKY (GAUZE/BANDAGES/DRESSINGS) ×2 IMPLANT
DRAPE LG THREE QUARTER DISP (DRAPES) ×2 IMPLANT
DRSG ADAPTIC 3X8 NADH LF (GAUZE/BANDAGES/DRESSINGS) ×2 IMPLANT
DRSG PAD ABDOMINAL 8X10 ST (GAUZE/BANDAGES/DRESSINGS) ×4 IMPLANT
DURAPREP 26ML APPLICATOR (WOUND CARE) ×2 IMPLANT
GLOVE BIOGEL PI IND STRL 8 (GLOVE) ×1 IMPLANT
GLOVE BIOGEL PI INDICATOR 8 (GLOVE) ×1
GLOVE ECLIPSE 8.0 STRL XLNG CF (GLOVE) ×2 IMPLANT
GOWN STRL REUS W/TWL XL LVL3 (GOWN DISPOSABLE) ×2 IMPLANT
LEGGING LITHOTOMY PAIR STRL (DRAPES) ×2 IMPLANT
MANIFOLD NEPTUNE II (INSTRUMENTS) ×2 IMPLANT
PACK ARTHROSCOPY WL (CUSTOM PROCEDURE TRAY) ×2 IMPLANT
PACK ICE MAXI GEL EZY WRAP (MISCELLANEOUS) ×2 IMPLANT
PAD ABD 8X10 STRL (GAUZE/BANDAGES/DRESSINGS) ×2 IMPLANT
PAD MASON LEG HOLDER (PIN) ×2 IMPLANT
SET ARTHROSCOPY TUBING (MISCELLANEOUS) ×1
SET ARTHROSCOPY TUBING LN (MISCELLANEOUS) ×1 IMPLANT
SUT ETHILON 3 0 PS 1 (SUTURE) ×4 IMPLANT
TOWEL OR 17X26 10 PK STRL BLUE (TOWEL DISPOSABLE) ×2 IMPLANT
TUBING CONNECTING 10 (TUBING) IMPLANT
WAND 90 DEG TURBOVAC W/CORD (SURGICAL WAND) ×2 IMPLANT
WRAP KNEE MAXI GEL POST OP (GAUZE/BANDAGES/DRESSINGS) ×2 IMPLANT

## 2013-06-08 NOTE — Anesthesia Preprocedure Evaluation (Signed)
Anesthesia Evaluation  Patient identified by MRN, date of birth, ID band Patient awake    Reviewed: Allergy & Precautions, H&P , NPO status , Patient's Chart, lab work & pertinent test results  Airway Mallampati: III TM Distance: >3 FB Neck ROM: Full    Dental  (+) Chipped, Dental Advisory Given,    Pulmonary asthma ,  breath sounds clear to auscultation  Pulmonary exam normal       Cardiovascular hypertension, Pt. on medications and Pt. on home beta blockers Rhythm:Regular Rate:Normal     Neuro/Psych Anxiety Depression negative neurological ROS     GI/Hepatic Neg liver ROS, GERD-  ,  Endo/Other  Hypothyroidism Morbid obesity  Renal/GU negative Renal ROS  negative genitourinary   Musculoskeletal negative musculoskeletal ROS (+)   Abdominal   Peds  Hematology negative hematology ROS (+) anemia ,   Anesthesia Other Findings   Reproductive/Obstetrics                           Anesthesia Physical Anesthesia Plan  ASA: III  Anesthesia Plan: General   Post-op Pain Management:    Induction: Intravenous  Airway Management Planned: LMA  Additional Equipment:   Intra-op Plan:   Post-operative Plan: Extubation in OR  Informed Consent: I have reviewed the patients History and Physical, chart, labs and discussed the procedure including the risks, benefits and alternatives for the proposed anesthesia with the patient or authorized representative who has indicated his/her understanding and acceptance.   Dental advisory given  Plan Discussed with: CRNA  Anesthesia Plan Comments:         Anesthesia Quick Evaluation

## 2013-06-08 NOTE — Interval H&P Note (Signed)
History and Physical Interval Note:  06/08/2013 12:10 PM  Rachel [name]  has presented today for surgery, with the diagnosis of RIGHT KNEE MEDIAL MENISCUS TEAR  The various methods of treatment have been discussed with the patient and family. After consideration of risks, benefits and other options for treatment, the patient has consented to  Procedure(s): RIGHT KNEE ARTHROSCOPY WITH MEDIAL MENISECTOMY (Right) as a surgical intervention .  The patient's history has been reviewed, patient examined, no change in status, stable for surgery.  I have reviewed the patient's chart and labs.  Questions were answered to the patient's satisfaction.     Jacki Conesonald A Aerilynn Goin

## 2013-06-08 NOTE — Progress Notes (Signed)
Patients mother has her inhaler w her in the waiting room, if patient needs it

## 2013-06-08 NOTE — Transfer of Care (Signed)
Immediate Anesthesia Transfer of Care Note  Patient: Rachel [name]  Procedure(s) Performed: Procedure(s) (LRB): RIGHT KNEE ARTHROSCOPY WITH LATERAL MENISECTOMY with ABRASION CHRONDROPLASTY and SUPRAPATELLAR SYNOVECTOMY (Right)  Patient Location: PACU  Anesthesia Type: General  Level of Consciousness: sedated, patient cooperative and responds to stimulation  Airway & Oxygen Therapy: Patient Spontanous Breathing and Patient connected to face mask oxgen  Post-op Assessment: Report given to PACU RN and Post -op Vital signs reviewed and stable  Post vital signs: Reviewed and stable  Complications: No apparent anesthesia complications 2

## 2013-06-08 NOTE — Anesthesia Postprocedure Evaluation (Signed)
Anesthesia Post Note  Patient: Rachel [name]  Procedure(s) Performed: Procedure(s) (LRB): RIGHT KNEE ARTHROSCOPY WITH LATERAL MENISECTOMY with ABRASION CHRONDROPLASTY and SUPRAPATELLAR SYNOVECTOMY (Right)  Anesthesia type: General  Patient location: PACU  Post pain: Pain level controlled  Post assessment: Post-op Vital signs reviewed  Last Vitals:  Filed Vitals:   06/08/13 1604  BP: 143/69  Pulse: 69  Temp: 36.5 C  Resp: 16    Post vital signs: Reviewed  Level of consciousness: sedated  Complications: No apparent anesthesia complications

## 2013-06-08 NOTE — Brief Op Note (Signed)
06/08/2013  1:43 PM  PATIENT:  Leverne HumblesAngela G Rea  58 y.o. female  PRE-OPERATIVE DIAGNOSIS:  RIGHT KNEE MEDIAL MENISCUS TEAR  POST-OPERATIVE DIAGNOSIS:  RIGHT KNEE Lateral MENISCUS TEAR and Osteoarthritis  PROCEDURE:  Procedure(s): RIGHT KNEE ARTHROSCOPY WITH LATERAL MENISECTOMY with ABRASION CHRONDROPLASTY and SUPRAPATELLAR SYNOVECTOMY (Right).Abraision Chondroplasty of Medial Femoral Condyle.  SURGEON:  Surgeon(s) and Role:    * Drucilla SchmidtJames P Aplington, MD - Assisting    * Jacki Conesonald A Daymeon Fischman, MD - Primary  :   ASSISTANTS: Marlowe KaysJames Aplington MD   ANESTHESIA:   general  EBL:     BLOOD ADMINISTERED:none  DRAINS: none   LOCAL MEDICATIONS USED:  MARCAINE 30cc 5563f 0.25% with Epinephrine     SPECIMEN:  No Specimen  DISPOSITION OF SPECIMEN:  N/A  COUNTS:  YES  TOURNIQUET:  * No tourniquets in log *  DICTATION: .Other Dictation: Dictation Number [id number]  PLAN OF CARE: Discharge to home after PACU  PATIENT DISPOSITION:  stable in OR   Delay start of Pharmacological VTE agent (>24hrs) due to surgical blood loss or risk of bleeding: yes

## 2013-06-09 NOTE — Op Note (Signed)
NAMRinaldo [name]:  [name], Rachel                ACCOUNT NO.:  [id number]  MEDICAL RECORD NO.:  [id number]  LOCATION:  [facility]                         [facility]  PHYSICIAN:  Georges Lynchonald A. Zamira Hickam, M.D.DATE OF BIRTH:  1956/01/20  DATE OF PROCEDURE:  06/08/2013 DATE OF DISCHARGE:  06/08/2013                              OPERATIVE REPORT   SURGEON:  Windy Fastonald A. Darrelyn HillockGioffre, M.D.  ASSISTANT:  Marlowe KaysJames Aplington, M.D.  PREOPERATIVE DIAGNOSES: 1. Degenerative arthritis, right knee. 2. Rule out possibility of the lateral meniscus tear, right knee. 3. Rule out possibility of medial meniscus tear, right knee.  POSTOPERATIVE DIAGNOSES: 1. Degenerative arthritis, right knee. 2. There was a tear of the lateral meniscus, right knee. 3. There was no tear of the medial meniscus, right knee.  OPERATION: 1. Diagnostic arthroscopy, right knee. 2. Lateral meniscectomy, right knee. 3. Abrasion chondroplasty, medial femoral condyle, right knee. 4. Synovectomy, suprapatellar pouch, right knee.  PROCEDURE:  Under general anesthesia, the patient on a spinal frame, routine orthopedic prep and draping of the right lower extremity was carried out.  The patient had 3 g IV Ancef.  At this time, a small punctate incision made in the superior medial joint median up in the suprapatellar pouch.  Inflow cannula was inserted.  Knee was distended with saline.  Another small punctate incision made in the anterolateral joint.  The arthroscope was entered from lateral approach and complete diagnostic arthroscopy was carried out.  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  I utilized the [employer]Care and did a synovectomy.  I went down in the medial joint.  She had severe degenerative arthritic changes in the medial joint.  The medial meniscus really was intact but I had to do an abrasion chondroplasty of the medial femoral condyle and the tibial plateau.  The cruciates were intact.   I went over the lateral joint, she had peripheral tears of the lateral meniscus.  I introduced the ArthroCare and did a partial lateral meniscectomy.  I thoroughly irrigated out the knee, removed the fluid, closed all 3 punctate incisions with 3-0 nylon suture.  I injected 30 mL of 0.25% Marcaine with epinephrine in the knee joint.  1. Postop, she will be on aspirin 325 mg b.i.d. 2. Dilaudid 2 mg every 3 hours p.r.n. for pain. 3. She will be on a walker, full weightbearing as tolerated. 4. I will see her in the office in 10-12 days or prior to if there is     a problem.          ______________________________ Georges Lynchonald A. Darrelyn HillockGioffre, M.D.     RAG/MEDQ  D:  06/08/2013  T:  06/09/2013  Job:  [id number]

## 2013-06-10 ENCOUNTER — Encounter (HOSPITAL_COMMUNITY): Payer: Self-pay | Admitting: Orthopedic Surgery

## 2014-10-25 ENCOUNTER — Other Ambulatory Visit: Payer: Self-pay | Admitting: Surgical

## 2014-11-30 ENCOUNTER — Inpatient Hospital Stay: Admit: 2014-11-30 | Payer: BC Managed Care – PPO | Admitting: Orthopedic Surgery

## 2014-11-30 SURGERY — ARTHROPLASTY, KNEE, TOTAL
Anesthesia: General | Laterality: Right

## 2014-12-13 ENCOUNTER — Other Ambulatory Visit: Payer: Self-pay | Admitting: Surgical

## 2015-01-11 DIAGNOSIS — J3089 Other allergic rhinitis: Secondary | ICD-10-CM | POA: Diagnosis not present

## 2015-01-11 NOTE — Patient Instructions (Signed)
Rachel [name]  01/11/2015   Your procedure is scheduled on: 01/25/2015    [facility]  Entrance take [employer]  elevators to 3rd floor to  Short Stay Center at    [phone].  Call this number if you have problems the morning of surgery [phone]   Remember: ONLY 1 PERSON MAY GO WITH YOU TO SHORT STAY TO GET  READY MORNING OF YOUR SURGERY.  Do not eat food or drink liquids :After Midnight.     Take these medicines the morning of surgery with A SIP OF WATER:  Albuterol Inhaler if needed and bring, Atenolol ( Tenormin), allegra, Synthroid, Prilosec                                 You may not have any metal on your body including hair pins and              piercings  Do not wear jewelry, make-up, lotions, powders or perfumes, deodorant             Do not wear nail polish.  Do not shave  48 hours prior to surgery.               Do not bring valuables to the hospital. [place] IS NOT             RESPONSIBLE   FOR VALUABLES.  Contacts, dentures or bridgework may not be worn into surgery.  Leave suitcase in the car. After surgery it may be brought to your room.       Special Instructions: coughing and deep breathing exercises, leg exercises               Please read over the following fact sheets you were given: _____________________________________________________________________             [facility] - Preparing for Surgery Before surgery, you can play an important role.  Because skin is not sterile, your skin needs to be as free of germs as possible.  You can reduce the number of germs on your skin by washing with CHG (chlorahexidine gluconate) soap before surgery.  CHG is an antiseptic cleaner which kills germs and bonds with the skin to continue killing germs even after washing. Please DO NOT use if you have an allergy to CHG or antibacterial soaps.  If your skin becomes reddened/irritated stop using the CHG and inform your nurse when you  arrive at Short Stay. Do not shave (including legs and underarms) for at least 48 hours prior to the first CHG shower.  You may shave your face/neck. Please follow these instructions carefully:  1.  Shower with CHG Soap the night before surgery and the  morning of Surgery.  2.  If you choose to wash your hair, wash your hair first as usual with your  normal  shampoo.  3.  After you shampoo, rinse your hair and body thoroughly to remove the  shampoo.                           4.  Use CHG as you would any other liquid soap.  You can apply chg directly  to the skin and wash  Gently with a scrungie or clean washcloth.  5.  Apply the CHG Soap to your body ONLY FROM THE NECK DOWN.   Do not use on face/ open                           Wound or open sores. Avoid contact with eyes, ears mouth and genitals (private parts).                       Wash face,  Genitals (private parts) with your normal soap.             6.  Wash thoroughly, paying special attention to the area where your surgery  will be performed.  7.  Thoroughly rinse your body with warm water from the neck down.  8.  DO NOT shower/wash with your normal soap after using and rinsing off  the CHG Soap.                9.  Pat yourself dry with a clean towel.            10.  Wear clean pajamas.            11.  Place clean sheets on your bed the night of your first shower and do not  sleep with pets. Day of Surgery : Do not apply any lotions/deodorants the morning of surgery.  Please wear clean clothes to the hospital/surgery center.  FAILURE TO FOLLOW THESE INSTRUCTIONS MAY RESULT IN THE CANCELLATION OF YOUR SURGERY PATIENT SIGNATURE_________________________________  NURSE SIGNATURE__________________________________  ________________________________________________________________________  WHAT IS A BLOOD TRANSFUSION? Blood Transfusion Information  A transfusion is the replacement of blood or some of its parts. Blood  is made up of multiple cells which provide different functions.  Red blood cells carry oxygen and are used for blood loss replacement.  White blood cells fight against infection.  Platelets control bleeding.  Plasma helps clot blood.  Other blood products are available for specialized needs, such as hemophilia or other clotting disorders. BEFORE THE TRANSFUSION  Who gives blood for transfusions?   Healthy volunteers who are fully evaluated to make sure their blood is safe. This is blood bank blood. Transfusion therapy is the safest it has ever been in the practice of medicine. Before blood is taken from a donor, a complete history is taken to make sure that person has no history of diseases nor engages in risky social behavior (examples are intravenous drug use or sexual activity with multiple partners). The donor's travel history is screened to minimize risk of transmitting infections, such as malaria. The donated blood is tested for signs of infectious diseases, such as HIV and hepatitis. The blood is then tested to be sure it is compatible with you in order to minimize the chance of a transfusion reaction. If you or a relative donates blood, this is often done in anticipation of surgery and is not appropriate for emergency situations. It takes many days to process the donated blood. RISKS AND COMPLICATIONS Although transfusion therapy is very safe and saves many lives, the main dangers of transfusion include:  1. Getting an infectious disease. 2. Developing a transfusion reaction. This is an allergic reaction to something in the blood you were given. Every precaution is taken to prevent this. The decision to have a blood transfusion has been considered carefully by your caregiver before blood is given. Blood is not given unless the benefits outweigh  the risks. AFTER THE TRANSFUSION  Right after receiving a blood transfusion, you will usually feel much better and more energetic. This is  especially true if your red blood cells have gotten low (anemic). The transfusion raises the level of the red blood cells which carry oxygen, and this usually causes an energy increase.  The nurse administering the transfusion will monitor you carefully for complications. HOME CARE INSTRUCTIONS  No special instructions are needed after a transfusion. You may find your energy is better. Speak with your caregiver about any limitations on activity for underlying diseases you may have. SEEK MEDICAL CARE IF:   Your condition is not improving after your transfusion.  You develop redness or irritation at the intravenous (IV) site. SEEK IMMEDIATE MEDICAL CARE IF:  Any of the following symptoms occur over the next 12 hours:  Shaking chills.  You have a temperature by mouth above 102 F (38.9 C), not controlled by medicine.  Chest, back, or muscle pain.  People around you feel you are not acting correctly or are confused.  Shortness of breath or difficulty breathing.  Dizziness and fainting.  You get a rash or develop hives.  You have a decrease in urine output.  Your urine turns a dark color or changes to pink, red, or brown. Any of the following symptoms occur over the next 10 days:  You have a temperature by mouth above 102 F (38.9 C), not controlled by medicine.  Shortness of breath.  Weakness after normal activity.  The white part of the eye turns yellow (jaundice).  You have a decrease in the amount of urine or are urinating less often.  Your urine turns a dark color or changes to pink, red, or brown. Document Released: 01/05/2000 Document Revised: 04/01/2011 Document Reviewed: 08/24/2007 ExitCare Patient Information 2014 [place].  _______________________________________________________________________  Incentive Spirometer  An incentive spirometer is a tool that can help keep your lungs clear and active. This tool measures how well you are filling your lungs  with each breath. Taking long deep breaths may help reverse or decrease the chance of developing breathing (pulmonary) problems (especially infection) following:  A long period of time when you are unable to move or be active. BEFORE THE PROCEDURE   If the spirometer includes an indicator to show your best effort, your nurse or respiratory therapist will set it to a desired goal.  If possible, sit up straight or lean slightly forward. Try not to slouch.  Hold the incentive spirometer in an upright position. INSTRUCTIONS FOR USE  3. Sit on the edge of your bed if possible, or sit up as far as you can in bed or on a chair. 4. Hold the incentive spirometer in an upright position. 5. Breathe out normally. 6. Place the mouthpiece in your mouth and seal your lips tightly around it. 7. Breathe in slowly and as deeply as possible, raising the piston or the ball toward the top of the column. 8. Hold your breath for 3-5 seconds or for as long as possible. Allow the piston or ball to fall to the bottom of the column. 9. Remove the mouthpiece from your mouth and breathe out normally. 10. Rest for a few seconds and repeat Steps 1 through 7 at least 10 times every 1-2 hours when you are awake. Take your time and take a few normal breaths between deep breaths. 11. The spirometer may include an indicator to show your best effort. Use the indicator as a goal to work  toward during each repetition. 12. After each set of 10 deep breaths, practice coughing to be sure your lungs are clear. If you have an incision (the cut made at the time of surgery), support your incision when coughing by placing a pillow or rolled up towels firmly against it. Once you are able to get out of bed, walk around indoors and cough well. You may stop using the incentive spirometer when instructed by your caregiver.  RISKS AND COMPLICATIONS  Take your time so you do not get dizzy or light-headed.  If you are in pain, you may need to  take or ask for pain medication before doing incentive spirometry. It is harder to take a deep breath if you are having pain. AFTER USE  Rest and breathe slowly and easily.  It can be helpful to keep track of a log of your progress. Your caregiver can provide you with a simple table to help with this. If you are using the spirometer at home, follow these instructions: [place] IF:   You are having difficultly using the spirometer.  You have trouble using the spirometer as often as instructed.  Your pain medication is not giving enough relief while using the spirometer.  You develop fever of 100.5 F (38.1 C) or higher. SEEK IMMEDIATE MEDICAL CARE IF:   You cough up bloody sputum that had not been present before.  You develop fever of 102 F (38.9 C) or greater.  You develop worsening pain at or near the incision site. MAKE SURE YOU:   Understand these instructions.  Will watch your condition.  Will get help right away if you are not doing well or get worse. Document Released: 05/20/2006 Document Revised: 04/01/2011 Document Reviewed: 07/21/2006 [facility] Patient Information 2014 [place], [place].   ________________________________________________________________________

## 2015-01-12 ENCOUNTER — Ambulatory Visit (HOSPITAL_COMMUNITY)
Admission: RE | Admit: 2015-01-12 | Discharge: 2015-01-12 | Disposition: A | Discharge status: Home / Self Care | Payer: BC Managed Care – PPO | Source: Ambulatory Visit | Attending: Surgical | Admitting: Surgical

## 2015-01-12 ENCOUNTER — Encounter (HOSPITAL_COMMUNITY)
Admission: RE | Admit: 2015-01-12 | Discharge: 2015-01-12 | Disposition: A | Discharge status: Home / Self Care | Payer: BC Managed Care – PPO | Source: Ambulatory Visit | Attending: Orthopedic Surgery | Admitting: Orthopedic Surgery

## 2015-01-12 ENCOUNTER — Encounter (HOSPITAL_COMMUNITY): Payer: Self-pay

## 2015-01-12 DIAGNOSIS — J45909 Unspecified asthma, uncomplicated: Secondary | ICD-10-CM | POA: Insufficient documentation

## 2015-01-12 DIAGNOSIS — Z01818 Encounter for other preprocedural examination: Secondary | ICD-10-CM

## 2015-01-12 DIAGNOSIS — M5134 Other intervertebral disc degeneration, thoracic region: Secondary | ICD-10-CM | POA: Diagnosis not present

## 2015-01-12 DIAGNOSIS — I10 Essential (primary) hypertension: Secondary | ICD-10-CM | POA: Insufficient documentation

## 2015-01-12 DIAGNOSIS — I517 Cardiomegaly: Secondary | ICD-10-CM | POA: Diagnosis not present

## 2015-01-12 LAB — CBC WITH DIFFERENTIAL/PLATELET
Basophils Absolute: 0 10*3/uL (ref 0.0–0.1)
Basophils Relative: 0 %
Eosinophils Absolute: 0.2 10*3/uL (ref 0.0–0.7)
Eosinophils Relative: 2 %
HCT: 41.6 % (ref 36.0–46.0)
Hemoglobin: 13.6 g/dL (ref 12.0–15.0)
Lymphocytes Relative: 33 %
Lymphs Abs: 2.7 10*3/uL (ref 0.7–4.0)
MCH: 29.3 pg (ref 26.0–34.0)
MCHC: 32.7 g/dL (ref 30.0–36.0)
MCV: 89.7 fL (ref 78.0–100.0)
Monocytes Absolute: 0.9 10*3/uL (ref 0.1–1.0)
Monocytes Relative: 11 %
Neutro Abs: 4.3 10*3/uL (ref 1.7–7.7)
Neutrophils Relative %: 54 %
Platelets: 182 10*3/uL (ref 150–400)
RBC: 4.64 MIL/uL (ref 3.87–5.11)
RDW: 13.1 % (ref 11.5–15.5)
WBC: 8 10*3/uL (ref 4.0–10.5)

## 2015-01-12 LAB — URINE MICROSCOPIC-ADD ON

## 2015-01-12 LAB — URINALYSIS, ROUTINE W REFLEX MICROSCOPIC
Bilirubin Urine: NEGATIVE
Glucose, UA: NEGATIVE mg/dL
Hgb urine dipstick: NEGATIVE
Ketones, ur: NEGATIVE mg/dL
Nitrite: NEGATIVE
Protein, ur: NEGATIVE mg/dL
Specific Gravity, Urine: 1.025 (ref 1.005–1.030)
pH: 6 (ref 5.0–8.0)

## 2015-01-12 LAB — PROTIME-INR
INR: 1.04 (ref 0.00–1.49)
Prothrombin Time: 13.8 seconds (ref 11.6–15.2)

## 2015-01-12 LAB — COMPREHENSIVE METABOLIC PANEL
ALT: 31 U/L (ref 14–54)
AST: 33 U/L (ref 15–41)
Albumin: 3.9 g/dL (ref 3.5–5.0)
Alkaline Phosphatase: 57 U/L (ref 38–126)
Anion gap: 9 (ref 5–15)
BUN: 19 mg/dL (ref 6–20)
CO2: 28 mmol/L (ref 22–32)
Calcium: 9.5 mg/dL (ref 8.9–10.3)
Chloride: 106 mmol/L (ref 101–111)
Creatinine, Ser: 0.75 mg/dL (ref 0.44–1.00)
GFR calc Af Amer: >60 mL/min (ref >60)
GFR calc non Af Amer: >60 mL/min (ref >60)
Glucose, Bld: 97 mg/dL (ref 65–99)
Potassium: 4.2 mmol/L (ref 3.5–5.1)
Sodium: 143 mmol/L (ref 135–145)
Total Bilirubin: 0.9 mg/dL (ref 0.3–1.2)
Total Protein: 7 g/dL (ref 6.5–8.1)

## 2015-01-12 LAB — SURGICAL PCR SCREEN
MRSA, PCR: NEGATIVE
STAPHYLOCOCCUS AUREUS: NEGATIVE

## 2015-01-12 LAB — APTT: aPTT: 28 seconds (ref 24–37)

## 2015-01-12 NOTE — Progress Notes (Signed)
U/A and micro results  Done 01/12/15  faxed via EPIC to Dr Darrelyn HillockGioffre.

## 2015-01-17 NOTE — H&P (Signed)
TOTAL KNEE ADMISSION H&P  Patient is being admitted for right total knee arthroplasty.  Subjective:  Chief Complaint:right knee pain.  HPI: Rachel [name], 59 y.o. female, has a history of pain and functional disability in the right knee due to trauma and arthritis and has failed non-surgical conservative treatments for greater than 12 weeks to includeNSAID's and/or analgesics, corticosteriod injections, flexibility and strengthening excercises, use of assistive devices and activity modification.  Onset of symptoms was gradual, starting 2 years ago with gradually worsening course since that time. The patient noted prior procedures on the knee to include  arthroscopy and menisectomy on the right knee(s).  Patient currently rates pain in the right knee(s) at 8 out of 10 with activity. Patient has night pain, worsening of pain with activity and weight bearing, pain that interferes with activities of daily living, pain with passive range of motion, crepitus and joint swelling.  Patient has evidence of periarticular osteophytes and joint space narrowing by imaging studies. There is no active infection.  Patient Active Problem List   Diagnosis Date Noted  . Osteoarthritis of right knee 06/08/2013  . Lateral meniscus tear, current 06/08/2013   Past Medical History  Diagnosis Date  . Hypertension   . Asthma   . Pneumonia     hx of 2010   . Hypothyroidism   . GERD (gastroesophageal reflux disease)   . Arthritis   . Anemia   . Headache     occasional     Past Surgical History  Procedure Laterality Date  . Carpal tunnel release      bilateral   . Joint replacement      left hip   . Cholecystectomy    . Tonsillectomy    . Dilation and curettage of uterus      several   . Breast surgery      lumpectomy x 2 - benign   . Ankle surgery      left   . Knee arthroscopy with medial menisectomy Right 06/08/2013    Procedure: RIGHT KNEE ARTHROSCOPY WITH LATERAL MENISECTOMY with ABRASION  CHRONDROPLASTY and SUPRAPATELLAR SYNOVECTOMY;  Surgeon: Jacki Conesonald A Gioffre, MD;  Location: [facility];  Service: Orthopedics;  Laterality: Right;      Current outpatient prescriptions:  .  acetaminophen (TYLENOL) 500 MG tablet, Take 1,000 mg by mouth every 6 (six) hours as needed for mild pain., Disp: , Rfl:  .  albuterol (PROVENTIL HFA;VENTOLIN HFA) 108 (90 BASE) MCG/ACT inhaler, Inhale 1 puff into the lungs every 6 (six) hours as needed for wheezing or shortness of breath., Disp: , Rfl:  .  aspirin EC 81 MG tablet, Take 81 mg by mouth daily., Disp: , Rfl:  .  atenolol (TENORMIN) 100 MG tablet, Take 100 mg by mouth every morning., Disp: , Rfl:  .  Cholecalciferol (VITAMIN D) 2000 UNITS tablet, Take 2,000 Units by mouth daily., Disp: , Rfl:  .  ferrous sulfate 325 (65 FE) MG tablet, Take 325 mg by mouth daily with breakfast., Disp: , Rfl:  .  fexofenadine (ALLEGRA) 180 MG tablet, Take 180 mg by mouth daily., Disp: , Rfl:  .  fluticasone (FLONASE) 50 MCG/ACT nasal spray, Place 2 sprays into both nostrils at bedtime. , Disp: , Rfl:  .  levothyroxine (SYNTHROID, LEVOTHROID) 137 MCG tablet, Take 137 mcg by mouth daily before breakfast., Disp: , Rfl:  .  meloxicam (MOBIC) 7.5 MG tablet, Take 7.5 mg by mouth 2 (two) times daily., Disp: , Rfl:  .  methocarbamol (ROBAXIN) 500 MG tablet, Take 500 mg by mouth every 8 (eight) hours as needed for muscle spasms. , Disp: , Rfl:  .  Multiple Vitamin (MULTIVITAMIN WITH MINERALS) TABS tablet, Take 1 tablet by mouth daily., Disp: , Rfl:  .  NON FORMULARY, every 30 (thirty) days. Allergy shot To have on 01/19/2015, Disp: , Rfl:  .  omeprazole (PRILOSEC) 20 MG capsule, Take 20 mg by mouth daily., Disp: , Rfl:  .  rOPINIRole (REQUIP) 0.25 MG tablet, Take 0.5 mg by mouth at bedtime., Disp: , Rfl:  .  traMADol (ULTRAM) 50 MG tablet, Take 50 mg by mouth every 6 (six) hours as needed (pain)., Disp: , Rfl:   Allergies  Allergen Reactions  . Codeine Itching  . Guaifenesin &  Derivatives Anaphylaxis    Social History  Substance Use Topics  . Smoking status: Never Smoker   . Smokeless tobacco: Never Used  . Alcohol Use: Yes     Comment: rare      Review of Systems  Constitutional: Negative.   HENT: Negative for congestion, ear discharge, ear pain, hearing loss, nosebleeds, sore throat and tinnitus.   Eyes: Negative.   Respiratory: Positive for shortness of breath. Negative for cough, hemoptysis, sputum production, wheezing and stridor.        SOB with exertion  Cardiovascular: Negative.   Gastrointestinal: Positive for heartburn. Negative for nausea, vomiting, abdominal pain, diarrhea, constipation, blood in stool and melena.  Genitourinary: Negative.   Musculoskeletal: Positive for myalgias, back pain and joint pain. Negative for falls and neck pain.       Right knee pain  Skin: Negative.   Neurological: Positive for headaches. Negative for dizziness, tingling, tremors, sensory change, speech change, focal weakness, seizures and loss of consciousness.  Endo/Heme/Allergies: Negative.   Psychiatric/Behavioral: Negative.     Objective:  Physical Exam  Constitutional: She is oriented to person, place, and time. She appears well-developed. No distress.  Morbidly obese  HENT:  Head: Normocephalic and atraumatic.  Right Ear: External ear normal.  Left Ear: External ear normal.  Nose: Nose normal.  Mouth/Throat: Oropharynx is clear and moist.  Eyes: Conjunctivae and EOM are normal.  Neck: Normal range of motion. Neck supple.  Cardiovascular: Normal rate, regular rhythm, normal heart sounds and intact distal pulses.   No murmur heard. Respiratory: Effort normal and breath sounds normal. No respiratory distress. She has no wheezes.  GI: Soft. Bowel sounds are normal. She exhibits no distension. There is no tenderness.  Musculoskeletal:       Right hip: Normal.       Left hip: Normal.       Right knee: She exhibits decreased range of motion and  swelling. She exhibits no effusion and no erythema. Tenderness found. Medial joint line and lateral joint line tenderness noted.       Left knee: Normal.  Neurological: She is alert and oriented to person, place, and time. She has normal strength and normal reflexes. No sensory deficit.  Skin: No rash noted. She is not diaphoretic. No erythema.  Psychiatric: Her behavior is normal.    Vitals  Weight: 330 lb Height: 66in Body Surface Area: 2.47 m Body Mass Index: 53.26 kg/m  Pulse: 76 (Regular)  BP: 145/88 (Sitting, Left Arm, Standard)  Imaging Review Plain radiographs demonstrate severe degenerative joint disease of the right knee(s). The overall alignment ismild varus. The bone quality appears to be good for age and reported activity level.  Assessment/Plan:  End stage primary  osteoarthritis, right knee   The patient history, physical examination, clinical judgment of the provider and imaging studies are consistent with end stage degenerative joint disease of the right knee(s) and total knee arthroplasty is deemed medically necessary. The treatment options including medical management, injection therapy arthroscopy and arthroplasty were discussed at length. The risks and benefits of total knee arthroplasty were presented and reviewed. The risks due to aseptic loosening, infection, stiffness, patella tracking problems, thromboembolic complications and other imponderables were discussed. The patient acknowledged the explanation, agreed to proceed with the plan and consent was signed. Patient is being admitted for inpatient treatment for surgery, pain control, PT, OT, prophylactic antibiotics, VTE prophylaxis, progressive ambulation and ADL's and discharge planning. The patient is planning to be discharged home with home health services    PCP: Dr. Sigmund Hazel    Dimitri Ped, PA-C

## 2015-01-19 ENCOUNTER — Ambulatory Visit (INDEPENDENT_AMBULATORY_CARE_PROVIDER_SITE_OTHER): Payer: BC Managed Care – PPO

## 2015-01-19 DIAGNOSIS — J309 Allergic rhinitis, unspecified: Secondary | ICD-10-CM

## 2015-01-19 NOTE — Progress Notes (Signed)
Immunotherapy   Patient Details  Name: Rachel [name] MRN: [id number] Date of Birth: 10/04/1955  01/19/2015  Rachel HumblesAngela G Salvetti here to pick up Red 1:100(molds) Following schedule: C  Frequency:1 time per week, @ .50 Q 4 weeks Epi-Pen:Epi-Pen Avaiable  Consent signed and patient instructions given. No problems   Damita Gainey 01/19/2015, 11:40 AM

## 2015-01-24 MED ORDER — DEXTROSE 5 % IV SOLN
3.0000 g | INTRAVENOUS | Status: AC
  Administered 2015-01-25: 3 g via INTRAVENOUS
  Filled 2015-01-24: qty 3000

## 2015-01-25 ENCOUNTER — Encounter (HOSPITAL_COMMUNITY)
Admission: RE | Disposition: A | Discharge status: Home Health | Payer: Self-pay | Source: Ambulatory Visit | Attending: Orthopedic Surgery

## 2015-01-25 ENCOUNTER — Inpatient Hospital Stay (HOSPITAL_COMMUNITY)
Admission: RE | Admit: 2015-01-25 | Discharge: 2015-01-27 | DRG: 470 | Disposition: A | Discharge status: Home Health | Payer: BC Managed Care – PPO | Source: Ambulatory Visit | Attending: Orthopedic Surgery | Admitting: Orthopedic Surgery

## 2015-01-25 ENCOUNTER — Inpatient Hospital Stay (HOSPITAL_COMMUNITY): Payer: BC Managed Care – PPO | Admitting: Anesthesiology

## 2015-01-25 ENCOUNTER — Encounter (HOSPITAL_COMMUNITY): Payer: Self-pay | Admitting: *Deleted

## 2015-01-25 DIAGNOSIS — Z6841 Body Mass Index (BMI) 40.0 and over, adult: Secondary | ICD-10-CM

## 2015-01-25 DIAGNOSIS — Z96659 Presence of unspecified artificial knee joint: Secondary | ICD-10-CM

## 2015-01-25 DIAGNOSIS — K219 Gastro-esophageal reflux disease without esophagitis: Secondary | ICD-10-CM | POA: Diagnosis present

## 2015-01-25 DIAGNOSIS — Z96642 Presence of left artificial hip joint: Secondary | ICD-10-CM | POA: Diagnosis present

## 2015-01-25 DIAGNOSIS — E039 Hypothyroidism, unspecified: Secondary | ICD-10-CM | POA: Diagnosis present

## 2015-01-25 DIAGNOSIS — Z888 Allergy status to other drugs, medicaments and biological substances status: Secondary | ICD-10-CM

## 2015-01-25 DIAGNOSIS — Z885 Allergy status to narcotic agent status: Secondary | ICD-10-CM

## 2015-01-25 DIAGNOSIS — M1711 Unilateral primary osteoarthritis, right knee: Principal | ICD-10-CM | POA: Diagnosis present

## 2015-01-25 DIAGNOSIS — I10 Essential (primary) hypertension: Secondary | ICD-10-CM | POA: Diagnosis present

## 2015-01-25 DIAGNOSIS — J45909 Unspecified asthma, uncomplicated: Secondary | ICD-10-CM | POA: Diagnosis present

## 2015-01-25 HISTORY — PX: TOTAL KNEE ARTHROPLASTY: SHX125

## 2015-01-25 LAB — TYPE AND SCREEN
ABO/RH(D): AB NEG
Antibody Screen: NEGATIVE

## 2015-01-25 SURGERY — ARTHROPLASTY, KNEE, TOTAL
Anesthesia: General | Laterality: Right

## 2015-01-25 MED ORDER — ACETAMINOPHEN 650 MG RE SUPP
650.0000 mg | Freq: Four times a day (QID) | RECTAL | Status: DC | PRN
Start: 2015-01-25 — End: 2015-01-27

## 2015-01-25 MED ORDER — ONDANSETRON HCL 4 MG/2ML IJ SOLN: 4.0000 mg | Freq: Four times a day (QID) | INTRAMUSCULAR | Status: DC | PRN

## 2015-01-25 MED ORDER — ROCURONIUM BROMIDE 100 MG/10ML IV SOLN
INTRAVENOUS | Status: AC
  Filled 2015-01-25: qty 1

## 2015-01-25 MED ORDER — HYDROMORPHONE HCL 1 MG/ML IJ SOLN
INTRAMUSCULAR | Status: DC | PRN
  Administered 2015-01-25 (×5): .4 mg via INTRAVENOUS

## 2015-01-25 MED ORDER — FENTANYL CITRATE (PF) 250 MCG/5ML IJ SOLN
INTRAMUSCULAR | Status: AC
  Filled 2015-01-25: qty 5

## 2015-01-25 MED ORDER — FLEET ENEMA 7-19 GM/118ML RE ENEM: 1.0000 | ENEMA | Freq: Once | RECTAL | Status: DC | PRN

## 2015-01-25 MED ORDER — BUPIVACAINE LIPOSOME 1.3 % IJ SUSP
INTRAMUSCULAR | Status: DC | PRN
  Administered 2015-01-25: 20 mL

## 2015-01-25 MED ORDER — BUPIVACAINE HCL (PF) 0.25 % IJ SOLN
INTRAMUSCULAR | Status: AC
  Filled 2015-01-25: qty 30

## 2015-01-25 MED ORDER — TRAMADOL HCL 50 MG PO TABS
50.0000 mg | ORAL_TABLET | Freq: Four times a day (QID) | ORAL | Status: DC | PRN
Start: 2015-01-25 — End: 2015-01-27
  Administered 2015-01-25 – 2015-01-27 (×5): 50 mg via ORAL
  Filled 2015-01-25 (×5): qty 1

## 2015-01-25 MED ORDER — SODIUM CHLORIDE 0.9 % IR SOLN
Status: AC
  Filled 2015-01-25: qty 1

## 2015-01-25 MED ORDER — MIDAZOLAM HCL 2 MG/2ML IJ SOLN
INTRAMUSCULAR | Status: AC
  Filled 2015-01-25: qty 2

## 2015-01-25 MED ORDER — FERROUS SULFATE 325 (65 FE) MG PO TABS
325.0000 mg | ORAL_TABLET | Freq: Three times a day (TID) | ORAL | Status: DC
  Administered 2015-01-25 – 2015-01-27 (×5): 325 mg via ORAL
  Filled 2015-01-25 (×8): qty 1

## 2015-01-25 MED ORDER — PROPOFOL 10 MG/ML IV BOLUS
INTRAVENOUS | Status: DC | PRN
  Administered 2015-01-25: 150 mg via INTRAVENOUS

## 2015-01-25 MED ORDER — LIDOCAINE HCL (CARDIAC) 20 MG/ML IV SOLN
INTRAVENOUS | Status: AC
  Filled 2015-01-25: qty 5

## 2015-01-25 MED ORDER — CEFAZOLIN SODIUM 1-5 GM-% IV SOLN
1.0000 g | Freq: Four times a day (QID) | INTRAVENOUS | Status: AC
  Administered 2015-01-25 (×2): 1 g via INTRAVENOUS
  Filled 2015-01-25 (×2): qty 50

## 2015-01-25 MED ORDER — PHENOL 1.4 % MT LIQD
1.0000 | OROMUCOSAL | Status: DC | PRN
Start: 2015-01-25 — End: 2015-01-27

## 2015-01-25 MED ORDER — METHOCARBAMOL 500 MG PO TABS
500.0000 mg | ORAL_TABLET | Freq: Four times a day (QID) | ORAL | Status: DC | PRN
  Administered 2015-01-25 – 2015-01-27 (×6): 500 mg via ORAL
  Filled 2015-01-25 (×6): qty 1

## 2015-01-25 MED ORDER — HYDROMORPHONE HCL 2 MG PO TABS
4.0000 mg | ORAL_TABLET | ORAL | Status: DC | PRN
  Administered 2015-01-25 – 2015-01-27 (×11): 4 mg via ORAL
  Filled 2015-01-25 (×11): qty 2

## 2015-01-25 MED ORDER — LACTATED RINGERS IV SOLN
INTRAVENOUS | Status: DC
  Administered 2015-01-25 (×3): via INTRAVENOUS

## 2015-01-25 MED ORDER — ATENOLOL 100 MG PO TABS
100.0000 mg | ORAL_TABLET | Freq: Every morning | ORAL | Status: DC
  Administered 2015-01-26 – 2015-01-27 (×2): 100 mg via ORAL
  Filled 2015-01-25 (×2): qty 1

## 2015-01-25 MED ORDER — ACETAMINOPHEN 10 MG/ML IV SOLN
1000.0000 mg | Freq: Once | INTRAVENOUS | Status: AC
  Administered 2015-01-25: 1000 mg via INTRAVENOUS

## 2015-01-25 MED ORDER — ONDANSETRON HCL 4 MG/2ML IJ SOLN
INTRAMUSCULAR | Status: DC | PRN
  Administered 2015-01-25: 4 mg via INTRAVENOUS

## 2015-01-25 MED ORDER — ONDANSETRON HCL 4 MG/2ML IJ SOLN
INTRAMUSCULAR | Status: AC
  Filled 2015-01-25: qty 2

## 2015-01-25 MED ORDER — CELECOXIB 200 MG PO CAPS
200.0000 mg | ORAL_CAPSULE | Freq: Two times a day (BID) | ORAL | Status: DC
  Administered 2015-01-25 – 2015-01-27 (×4): 200 mg via ORAL
  Filled 2015-01-25 (×6): qty 1

## 2015-01-25 MED ORDER — SODIUM CHLORIDE 0.9 % IV SOLN
1000.0000 mg | INTRAVENOUS | Status: AC
  Administered 2015-01-25: 1000 mg via INTRAVENOUS
  Filled 2015-01-25: qty 10

## 2015-01-25 MED ORDER — ALBUTEROL SULFATE (2.5 MG/3ML) 0.083% IN NEBU: 2.5000 mg | INHALATION_SOLUTION | Freq: Four times a day (QID) | RESPIRATORY_TRACT | Status: DC | PRN

## 2015-01-25 MED ORDER — BUPIVACAINE LIPOSOME 1.3 % IJ SUSP
20.0000 mL | Freq: Once | INTRAMUSCULAR | Status: DC
  Filled 2015-01-25: qty 20

## 2015-01-25 MED ORDER — METHOCARBAMOL 1000 MG/10ML IJ SOLN
500.0000 mg | Freq: Four times a day (QID) | INTRAVENOUS | Status: DC | PRN
  Administered 2015-01-25: 500 mg via INTRAVENOUS
  Filled 2015-01-25 (×2): qty 5

## 2015-01-25 MED ORDER — LEVOTHYROXINE SODIUM 137 MCG PO TABS
137.0000 ug | ORAL_TABLET | Freq: Every day | ORAL | Status: DC
  Administered 2015-01-26 – 2015-01-27 (×2): 137 ug via ORAL
  Filled 2015-01-25 (×3): qty 1

## 2015-01-25 MED ORDER — LACTATED RINGERS IV SOLN: INTRAVENOUS | Status: DC

## 2015-01-25 MED ORDER — POLYMYXIN B SULFATE 500000 UNITS IJ SOLR
INTRAMUSCULAR | Status: DC | PRN
  Administered 2015-01-25: 500 mL

## 2015-01-25 MED ORDER — ROPINIROLE HCL 0.5 MG PO TABS
0.5000 mg | ORAL_TABLET | Freq: Every day | ORAL | Status: DC
  Administered 2015-01-25 – 2015-01-26 (×2): 0.5 mg via ORAL
  Filled 2015-01-25 (×3): qty 1

## 2015-01-25 MED ORDER — BISACODYL 5 MG PO TBEC
5.0000 mg | DELAYED_RELEASE_TABLET | Freq: Every day | ORAL | Status: DC | PRN
  Administered 2015-01-26: 5 mg via ORAL
  Filled 2015-01-25: qty 1

## 2015-01-25 MED ORDER — ONDANSETRON HCL 4 MG PO TABS: 4.0000 mg | ORAL_TABLET | Freq: Four times a day (QID) | ORAL | Status: DC | PRN

## 2015-01-25 MED ORDER — THROMBIN 5000 UNITS EX SOLR
OROMUCOSAL | Status: DC | PRN
  Administered 2015-01-25: 10:00:00 via TOPICAL

## 2015-01-25 MED ORDER — HYDROMORPHONE HCL 2 MG/ML IJ SOLN
INTRAMUSCULAR | Status: AC
  Filled 2015-01-25: qty 1

## 2015-01-25 MED ORDER — CHLORHEXIDINE GLUCONATE 4 % EX LIQD: 60.0000 mL | Freq: Once | CUTANEOUS | Status: DC

## 2015-01-25 MED ORDER — THROMBIN 5000 UNITS EX SOLR
CUTANEOUS | Status: AC
  Filled 2015-01-25: qty 10000

## 2015-01-25 MED ORDER — RIVAROXABAN 10 MG PO TABS
10.0000 mg | ORAL_TABLET | Freq: Every day | ORAL | Status: DC
  Administered 2015-01-26 – 2015-01-27 (×2): 10 mg via ORAL
  Filled 2015-01-25 (×3): qty 1

## 2015-01-25 MED ORDER — FLUTICASONE PROPIONATE 50 MCG/ACT NA SUSP
2.0000 | Freq: Every day | NASAL | Status: DC
  Filled 2015-01-25: qty 16

## 2015-01-25 MED ORDER — ACETAMINOPHEN 325 MG PO TABS
650.0000 mg | ORAL_TABLET | Freq: Four times a day (QID) | ORAL | Status: DC | PRN
Start: 2015-01-25 — End: 2015-01-27

## 2015-01-25 MED ORDER — ALUM & MAG HYDROXIDE-SIMETH 200-200-20 MG/5ML PO SUSP: 30.0000 mL | ORAL | Status: DC | PRN

## 2015-01-25 MED ORDER — POLYETHYLENE GLYCOL 3350 17 G PO PACK: 17.0000 g | PACK | Freq: Every day | ORAL | Status: DC | PRN

## 2015-01-25 MED ORDER — DEXAMETHASONE SODIUM PHOSPHATE 10 MG/ML IJ SOLN
INTRAMUSCULAR | Status: DC | PRN
  Administered 2015-01-25: 10 mg via INTRAVENOUS

## 2015-01-25 MED ORDER — ALBUTEROL SULFATE HFA 108 (90 BASE) MCG/ACT IN AERS: 1.0000 | INHALATION_SPRAY | Freq: Four times a day (QID) | RESPIRATORY_TRACT | Status: DC | PRN

## 2015-01-25 MED ORDER — PROPOFOL 10 MG/ML IV BOLUS
INTRAVENOUS | Status: AC
  Filled 2015-01-25: qty 20

## 2015-01-25 MED ORDER — HYDROMORPHONE HCL 1 MG/ML IJ SOLN
INTRAMUSCULAR | Status: AC
  Filled 2015-01-25: qty 1

## 2015-01-25 MED ORDER — BUPIVACAINE HCL (PF) 0.25 % IJ SOLN
INTRAMUSCULAR | Status: DC | PRN
  Administered 2015-01-25: 20 mL

## 2015-01-25 MED ORDER — ACETAMINOPHEN 10 MG/ML IV SOLN
INTRAVENOUS | Status: AC
  Filled 2015-01-25: qty 100

## 2015-01-25 MED ORDER — SUCCINYLCHOLINE CHLORIDE 20 MG/ML IJ SOLN
INTRAMUSCULAR | Status: DC | PRN
  Administered 2015-01-25: 100 mg via INTRAVENOUS

## 2015-01-25 MED ORDER — HYDROMORPHONE HCL 1 MG/ML IJ SOLN
0.2500 mg | INTRAMUSCULAR | Status: DC | PRN
  Administered 2015-01-25 (×2): 0.5 mg via INTRAVENOUS

## 2015-01-25 MED ORDER — MIDAZOLAM HCL 5 MG/5ML IJ SOLN
INTRAMUSCULAR | Status: DC | PRN
  Administered 2015-01-25: 2 mg via INTRAVENOUS

## 2015-01-25 MED ORDER — FENTANYL CITRATE (PF) 250 MCG/5ML IJ SOLN
INTRAMUSCULAR | Status: DC | PRN
  Administered 2015-01-25: 50 ug via INTRAVENOUS
  Administered 2015-01-25: 100 ug via INTRAVENOUS
  Administered 2015-01-25 (×2): 50 ug via INTRAVENOUS

## 2015-01-25 MED ORDER — MENTHOL 3 MG MT LOZG: 1.0000 | LOZENGE | OROMUCOSAL | Status: DC | PRN

## 2015-01-25 MED ORDER — SODIUM CHLORIDE 0.9 % IJ SOLN
INTRAMUSCULAR | Status: DC | PRN
  Administered 2015-01-25: 20 mL

## 2015-01-25 MED ORDER — LACTATED RINGERS IV SOLN
INTRAVENOUS | Status: DC
  Administered 2015-01-25 – 2015-01-26 (×2): via INTRAVENOUS

## 2015-01-25 MED ORDER — HYDROMORPHONE HCL 1 MG/ML IJ SOLN: 1.0000 mg | INTRAMUSCULAR | Status: DC | PRN

## 2015-01-25 MED ORDER — DEXAMETHASONE SODIUM PHOSPHATE 10 MG/ML IJ SOLN
INTRAMUSCULAR | Status: AC
  Filled 2015-01-25: qty 1

## 2015-01-25 MED ORDER — LIDOCAINE HCL (CARDIAC) 20 MG/ML IV SOLN
INTRAVENOUS | Status: DC | PRN
  Administered 2015-01-25: 100 mg via INTRATRACHEAL

## 2015-01-25 MED ORDER — SODIUM CHLORIDE 0.9 % IJ SOLN
INTRAMUSCULAR | Status: AC
  Filled 2015-01-25: qty 20

## 2015-01-25 SURGICAL SUPPLY — 63 items
ANCHOR PEEK ZIP 5.5 NDL NO2 (Orthopedic Implant) ×2 IMPLANT
BAG DECANTER FOR FLEXI CONT (MISCELLANEOUS) IMPLANT
BAG ZIPLOCK 12X15 (MISCELLANEOUS) IMPLANT
BANDAGE ACE 4X5 VEL STRL LF (GAUZE/BANDAGES/DRESSINGS) ×2 IMPLANT
BANDAGE ACE 6X5 VEL STRL LF (GAUZE/BANDAGES/DRESSINGS) ×2 IMPLANT
BLADE SAG 18X100X1.27 (BLADE) ×2 IMPLANT
BLADE SAW SGTL 11.0X1.19X90.0M (BLADE) ×2 IMPLANT
BNDG GAUZE ELAST 4 BULKY (GAUZE/BANDAGES/DRESSINGS) ×2 IMPLANT
BONE CEMENT GENTAMICIN (Cement) ×4 IMPLANT
CAP KNEE TOTAL 3 SIGMA ×2 IMPLANT
CEMENT BONE GENTAMICIN 40 (Cement) ×2 IMPLANT
CLOTH BEACON ORANGE TIMEOUT ST (SAFETY) ×2 IMPLANT
CUFF TOURN SGL QUICK 34 (TOURNIQUET CUFF)
CUFF TOURN SGL QUICK 44 (TOURNIQUET CUFF) ×2 IMPLANT
CUFF TRNQT CYL 34X4X40X1 (TOURNIQUET CUFF) IMPLANT
DECANTER SPIKE VIAL GLASS SM (MISCELLANEOUS) ×2 IMPLANT
DRAPE INCISE IOBAN 66X45 STRL (DRAPES) ×2 IMPLANT
DRAPE U-SHAPE 47X51 STRL (DRAPES) ×4 IMPLANT
DRSG EMULSION OIL 3X16 NADH (GAUZE/BANDAGES/DRESSINGS) ×2 IMPLANT
DRSG PAD ABDOMINAL 8X10 ST (GAUZE/BANDAGES/DRESSINGS) ×2 IMPLANT
DURAPREP 26ML APPLICATOR (WOUND CARE) ×2 IMPLANT
ELECT REM PT RETURN 9FT ADLT (ELECTROSURGICAL) ×2
ELECTRODE REM PT RTRN 9FT ADLT (ELECTROSURGICAL) ×1 IMPLANT
EVACUATOR 1/8 PVC DRAIN (DRAIN) ×2 IMPLANT
GAUZE SPONGE 4X4 12PLY STRL (GAUZE/BANDAGES/DRESSINGS) ×2 IMPLANT
GLOVE BIOGEL PI IND STRL 6.5 (GLOVE) ×1 IMPLANT
GLOVE BIOGEL PI IND STRL 8 (GLOVE) ×1 IMPLANT
GLOVE BIOGEL PI INDICATOR 6.5 (GLOVE) ×1
GLOVE BIOGEL PI INDICATOR 8 (GLOVE) ×1
GLOVE ECLIPSE 8.0 STRL XLNG CF (GLOVE) ×4 IMPLANT
GLOVE SURG SS PI 6.5 STRL IVOR (GLOVE) ×2 IMPLANT
GOWN STRL REUS W/TWL LRG LVL3 (GOWN DISPOSABLE) ×2 IMPLANT
GOWN STRL REUS W/TWL XL LVL3 (GOWN DISPOSABLE) ×2 IMPLANT
HANDPIECE INTERPULSE COAX TIP (DISPOSABLE) ×1
IMMOBILIZER KNEE 20 (SOFTGOODS) ×2
IMMOBILIZER KNEE 20 THIGH 36 (SOFTGOODS) ×1 IMPLANT
LIQUID BAND (GAUZE/BANDAGES/DRESSINGS) ×2 IMPLANT
MANIFOLD NEPTUNE II (INSTRUMENTS) ×2 IMPLANT
NEEDLE HYPO 22GX1.5 SAFETY (NEEDLE) ×2 IMPLANT
NS IRRIG 1000ML POUR BTL (IV SOLUTION) IMPLANT
PACK TOTAL KNEE CUSTOM (KITS) ×2 IMPLANT
PENCIL SMOKE EVAC W/HOLSTER (ELECTROSURGICAL) ×2 IMPLANT
POSITIONER SURGICAL ARM (MISCELLANEOUS) ×2 IMPLANT
SET HNDPC FAN SPRY TIP SCT (DISPOSABLE) ×1 IMPLANT
SET PAD KNEE POSITIONER (MISCELLANEOUS) ×2 IMPLANT
SPONGE LAP 18X18 X RAY DECT (DISPOSABLE) IMPLANT
SPONGE SURGIFOAM ABS GEL 100 (HEMOSTASIS) ×2 IMPLANT
STRIP CLOSURE SKIN 1/2X4 (GAUZE/BANDAGES/DRESSINGS) ×2 IMPLANT
SUCTION FRAZIER 12FR DISP (SUCTIONS) ×2 IMPLANT
SUT BONE WAX W31G (SUTURE) ×2 IMPLANT
SUT MNCRL AB 4-0 PS2 18 (SUTURE) ×2 IMPLANT
SUT VIC AB 1 CT1 27 (SUTURE) ×2
SUT VIC AB 1 CT1 27XBRD ANTBC (SUTURE) ×2 IMPLANT
SUT VIC AB 2-0 CT1 27 (SUTURE) ×3
SUT VIC AB 2-0 CT1 TAPERPNT 27 (SUTURE) ×3 IMPLANT
SUT VLOC 180 0 24IN GS25 (SUTURE) ×2 IMPLANT
TOWER CARTRIDGE SMART MIX (DISPOSABLE) ×2 IMPLANT
TRAY FOLEY W/METER SILVER 14FR (SET/KITS/TRAYS/PACK) ×2 IMPLANT
TRAY FOLEY W/METER SILVER 16FR (SET/KITS/TRAYS/PACK) ×2 IMPLANT
TRAY REVISION SZ 2.5 (Knees) ×2 IMPLANT
WATER STERILE IRR 1500ML POUR (IV SOLUTION) ×2 IMPLANT
WRAP KNEE MAXI GEL POST OP (GAUZE/BANDAGES/DRESSINGS) ×2 IMPLANT
YANKAUER SUCT BULB TIP 10FT TU (MISCELLANEOUS) ×2 IMPLANT

## 2015-01-25 NOTE — Anesthesia Postprocedure Evaluation (Signed)
Anesthesia Post Note  Patient: Rachel [name]  Procedure(s) Performed: Procedure(s) (LRB): TOTAL KNEE ARTHROPLASTY (Right)  Patient location during evaluation: PACU Anesthesia Type: General Level of consciousness: awake and alert Pain management: pain level controlled Vital Signs Assessment: post-procedure vital signs reviewed and stable Respiratory status: spontaneous breathing, nonlabored ventilation, respiratory function stable and patient connected to nasal cannula oxygen Cardiovascular status: blood pressure returned to baseline and stable Postop Assessment: no signs of nausea or vomiting Anesthetic complications: no    Last Vitals:  Filed Vitals:   01/25/15 1211 01/25/15 1318  BP: 114/67 128/72  Pulse: 65 57  Temp: 36.6 C 36.3 C  Resp: 14 16    Last Pain:  Filed Vitals:   01/25/15 1318  PainSc: 6                  Early Ord L

## 2015-01-25 NOTE — Op Note (Signed)
NAMRinaldo Ratel:  Bassette, Raynisha                ACCOUNT NO.:  [id number]  MEDICAL RECORD NO.:  [id number]  LOCATION:  [facility]                         [facility]  PHYSICIAN:  Georges Lynchonald A. Dorsie Burich, M.D.DATE OF BIRTH:  [birth year]  DATE OF PROCEDURE:  01/25/2015 DATE OF DISCHARGE:                              OPERATIVE REPORT   SURGEON:  Georges Lynchonald A. Darrelyn HillockGioffre, M.D.  OPERATIVE ASSISTANT:  Dimitri PedAmber Constable, PA  PREOPERATIVE DIAGNOSES: 1. Severe primary osteoarthritis with bone-on-bone and flexion     contracture, right knee. 2. Morbid obesity.  POSTOPERATIVE DIAGNOSES: 1. Severe primary osteoarthritis with bone-on-bone and flexion     contracture, right knee. 2. Morbid obesity.  OPERATION:  Right total knee arthroplasty utilizing the DePuy system, all 3 components were cemented, gentamicin was used in the cement.  Also I used one anchor to reinforce her patellar tendon.  Note, the sizes of the prosthesis, we utilized a right femoral component posterior cruciate sacrificing type, the size is 2.5.  The tibial tray was a 2.5.  We utilized the revision highlight revision tray, because of her weight. The insert was a size 17 and a 2.5, 17 mm thickness size 2.5, size 35 patella.  DESCRIPTION OF PROCEDURE:  Under general anesthesia, routine orthopedic prep and draping of the right lower extremity was carried out.  The appropriate time-out was carried out.  I also marked the appropriate right leg in the holding area.  At this time, the leg was exsanguinated with Esmarch.  Tourniquet was elevated to 350 mmHg.  With the knee flexed, an incision was made over the anterior aspect of the right knee. Note, prior to surgery, she had 3 g of IV Ancef.  She had tranexamic acid and also a gram of Tylenol IV.  Two flaps were created.  I then carried out a median parapatellar incision.  I inserted self-retaining retractors, I reflected the patella laterally.  I then went and did a synovectomy at that time.  Following  that initial drill hole was made in the intercondylar notch.  The guide rod was inserted.  I then removed and thoroughly irrigated out the canal.  We then removed 11 mm thickness off the distal femur in the usual fashion.  We then measured the femur to be a size 2.5.  We carried out anterior-posterior and chamfering cuts for a size 2.5 femoral component.  Next, attention was directed to the tibial tray and at this point, the tray measured to be a size 2.5.  We made initial drill hole in the usual fashion and prepared the tibia for a revision stem.  Following that, we then did this to intramedullary guide through an intramedullary rod.  We removed 6 mm thickness off the affected medial side.  Following that, we then inserted our lamina spreaders, removed the posterior spurs in the femoral condyles.  We then inserted our spacer blocks and felt a 17 mm thickness was fine mainly because of her body weight as well.  At this time, we then continued to prepare the tibia in the usual fashion.  Our keel cut was made.  We then cut our notch, cut out of the distal femur.  Trial components were  inserted.  We then did a resurfacing procedure on the patella for a size 5 patella, 3 drill holes were made in the articular surface of the patella.  All 3 trial components were removed.  We thoroughly water picked out the knee and cemented all 3 components in simultaneously. Once the cement was hardened, we removed all loose pieces of cement, thoroughly water picked out the knee, to make sure no other loose pieces of cement.  We went through trials again and finally selected a 17 mm thickness permanent insert rotating platform size 2.5.  Prior to inserting that we inserted some thrombin-soaked Gelfoam posteriorly and we also injected the soft surrounding soft tissue with 20 mL of 0.25% plain Marcaine.  The permanent rotating platform was inserted, knee was reduced.  We had excellent function, good stability.   Hemovac drain was inserted and we then closed the wound layers in usual fashion.  I did use Stryker anchor to reinforce the patella.  The sterile dressings then were applied.          ______________________________ Georges Lynch Darrelyn Hillock, M.D.     RAG/MEDQ  D:  01/25/2015  T:  01/25/2015  Job:  [id number]

## 2015-01-25 NOTE — Evaluation (Signed)
Physical Therapy Evaluation Patient Details Name: Rachel [name] MRN: [id number] DOB: 06/02/1955 Today's Date: 01/25/2015   History of Present Illness  Pt s/p R TKR with hx of L THR s/p ~ 9 yrs  Clinical Impression  Pt s/p R TKR presents with decreased R LE strength/ROM, post op pain, and obesity limiting functional mobility.  Pt should progress to dc home with family assist and HHPT follow up.    Follow Up Recommendations Home health PT    Equipment Recommendations  None recommended by PT    Recommendations for Other Services OT consult     Precautions / Restrictions Precautions Precautions: Knee;Fall Required Braces or Orthoses: Knee Immobilizer - Right Knee Immobilizer - Right: Discontinue once straight leg raise with < 10 degree lag Restrictions Weight Bearing Restrictions: No Other Position/Activity Restrictions: WBAT      Mobility  Bed Mobility Overal bed mobility: Needs Assistance;+2 for physical assistance;+ 2 for safety/equipment Bed Mobility: Supine to Sit;Sit to Supine     Supine to sit: Min assist;Mod assist;+2 for physical assistance;+2 for safety/equipment Sit to supine: Mod assist;+2 for physical assistance;+2 for safety/equipment   General bed mobility comments: cues for sequence and use of L LE to self assist  Transfers Overall transfer level: Needs assistance Equipment used: Rolling walker (2 wheeled) Transfers: Sit to/from Stand Sit to Stand: +2 physical assistance;+2 safety/equipment;From elevated surface;Min assist;Mod assist         General transfer comment: cues for LE management and use of UEs to self assist  Ambulation/Gait Ambulation/Gait assistance: Min assist;Mod assist;+2 physical assistance;+2 safety/equipment Ambulation Distance (Feet): 7 Feet Assistive device: Rolling walker (2 wheeled) Gait Pattern/deviations: Step-to pattern;Decreased step length - right;Decreased step length - left;Shuffle;Antalgic;Trunk flexed Gait velocity:  decr   General Gait Details: cues for sequence, posture, position from RW.  Ltd by fatigue and bed pulled to pt  Information systems managertairs            Wheelchair Mobility    Modified Rankin (Stroke Patients Only)       Balance                                             Pertinent Vitals/Pain Pain Assessment: 0-10 Pain Score: 7  Pain Location: R knee Pain Descriptors / Indicators: Aching;Sore Pain Intervention(s): Limited activity within patient's tolerance;Monitored during session;Premedicated before session;Ice applied    Home Living Family/patient expects to be discharged to:: Private residence Living Arrangements: Spouse/significant other Available Help at Discharge: Family Type of Home: Mobile home Home Access: Ramped entrance     Home Layout: One level Home Equipment: Toilet riser;Cane - single point;Walker - 2 wheels      Prior Function Level of Independence: Independent               Hand Dominance        Extremity/Trunk Assessment   Upper Extremity Assessment: Overall WFL for tasks assessed           Lower Extremity Assessment: RLE deficits/detail      Cervical / Trunk Assessment: Normal  Communication   Communication: No difficulties  Cognition Arousal/Alertness: Awake/alert Behavior During Therapy: WFL for tasks assessed/performed Overall Cognitive Status: Within Functional Limits for tasks assessed                      General Comments      Exercises  Assessment/Plan    PT Assessment Patient needs continued PT services  PT Diagnosis Difficulty walking   PT Problem List Decreased strength;Decreased range of motion;Decreased activity tolerance;Decreased mobility;Decreased knowledge of use of DME;Obesity;Pain  PT Treatment Interventions DME instruction;Gait training;Functional mobility training;Therapeutic activities;Therapeutic exercise;Patient/family education   PT Goals (Current goals can be found in the  Care Plan section) Acute Rehab PT Goals Patient Stated Goal: Resume previous lifestyle with decreased pain PT Goal Formulation: With patient Time For Goal Achievement: 01/30/15 Potential to Achieve Goals: Good    Frequency 7X/week   Barriers to discharge        Co-evaluation               End of Session Equipment Utilized During Treatment: Gait belt;Right knee immobilizer Activity Tolerance: Patient tolerated treatment well;Patient limited by fatigue;Patient limited by pain Patient left: in bed;with call bell/phone within reach Nurse Communication: Mobility status         Time: 1610-9604 PT Time Calculation (min) (ACUTE ONLY): 28 min   Charges:   PT Evaluation $PT Eval Low Complexity: 1 Procedure PT Treatments $Gait Training: 8-22 mins   PT G Codes:        Sparsh Callens 02/18/2015, 5:57 PM

## 2015-01-25 NOTE — Brief Op Note (Signed)
01/25/2015  10:39 AM  PATIENT:  Rachel [name]  60 y.o. female  PRE-OPERATIVE DIAGNOSIS: Primary  OA RIGHT KNEE and MORBID OBESITY  POST-OPERATIVE DIAGNOSIS:  Same as Pre-Op  PROCEDURE:  Procedure(s): TOTAL KNEE ARTHROPLASTY (Right)  SURGEON:  Surgeon(s) and Role:    * Ranee Gosselinonald Naria Abbey, MD - Primary  PHYSICIAN ASSISTANT:Amber Lockbourneonstable PA   ASSISTANTS: Dimitri PedAmber Constable PA   ANESTHESIA:   general  EBL:  Total I/O In: 1000 [I.V.:1000] Out: 250 [Urine:150; Blood:100]  BLOOD ADMINISTERED:none  DRAINS: (One) Hemovact drain(s) in the Right Knee with  Suction Open   LOCAL MEDICATIONS USED:  MARCAINE 20cc of 0.25% Plain and 20cc of Exparel mixed with 20 cc of Normal Saline.    SPECIMEN:  No Specimen  DISPOSITION OF SPECIMEN:  N/A  COUNTS:  YES  TOURNIQUET:   Total Tourniquet Time Documented: Thigh (Right) - 94 minutes Total: Thigh (Right) - 94 minutes   DICTATION: .Other Dictation: Dictation Number [id number]  PLAN OF CARE: Admit to inpatient   PATIENT DISPOSITION:  Stable in OR   Delay start of Pharmacological VTE agent (>24hrs) due to surgical blood loss or risk of bleeding: yes

## 2015-01-25 NOTE — Anesthesia Procedure Notes (Signed)
Procedure Name: Intubation Date/Time: 01/25/2015 8:34 AM Performed by: Delphia GratesHANDLER, Rachel [name] Pre-anesthesia Checklist: Patient identified, Emergency Drugs available, Suction available and Patient being monitored Patient Re-evaluated:Patient Re-evaluated prior to inductionOxygen Delivery Method: Circle system utilized Preoxygenation: Pre-oxygenation with 100% oxygen Intubation Type: IV induction Laryngoscope Size: Mac and 4 Grade View: Grade II Tube type: Oral Tube size: 7.5 mm Number of attempts: 1 Airway Equipment and Method: Stylet Placement Confirmation: ETT inserted through vocal cords under direct vision,  breath sounds checked- equal and bilateral and positive ETCO2 Secured at: 21 cm Tube secured with: Tape Dental Injury: Teeth and Oropharynx as per pre-operative assessment  Comments: Jagged upper teeth

## 2015-01-25 NOTE — Care Management Note (Addendum)
Case Management Note  Patient Details  Name: Rachel [name] MRN: [id number] Date of Birth: May 31, 1955  Subjective/Objective: 60 y.o. F admitted 01/25/2015 for R TKA. Underwent Arthroscopy earlier in 2016 and has DME: Dan HumphreysWalker, Toilet seat, and [employer] Service[employer]Cane.  Will await PT eval for disposition. Lives in private residence with Spouse.                   Action/Plan: Anticipate Discharge with [facility] (Tim aware) when ready.    Expected Discharge Date:                  Expected Discharge Plan:  Home w Home Health Services  In-House Referral:     Discharge planning Services  CM Consult  Post Acute Care Choice:    Choice offered to:     DME Arranged:   (Has Walker, Engineer, civil (consulting)Toilet Seat, Medical laboratory scientific officerCane) DME Agency:     HH Arranged:    HH Agency:     Status of Service:  In process, will continue to follow  Medicare Important Message Given:    Date Medicare IM Given:    Medicare IM give by:    Date Additional Medicare IM Given:    Additional Medicare Important Message give by:     If discussed at Long Length of Stay Meetings, dates discussed:    Additional Comments:  Yvone NeuCrutchfield, Ayiden Milliman M, RN 01/25/2015, 2:29 PM

## 2015-01-25 NOTE — Anesthesia Preprocedure Evaluation (Addendum)
Anesthesia Evaluation  Patient identified by MRN, date of birth, ID band Patient awake    Reviewed: Allergy & Precautions, H&P , NPO status , Patient's Chart, lab work & pertinent test results, reviewed documented beta blocker date and time   Airway Mallampati: II  TM Distance: >3 FB Neck ROM: full    Dental no notable dental hx. (+) Dental Advisory Given, Teeth Intact   Pulmonary neg pulmonary ROS, asthma ,    Pulmonary exam normal breath sounds clear to auscultation       Cardiovascular Exercise Tolerance: Good hypertension, Pt. on home beta blockers negative cardio ROS Normal cardiovascular exam Rhythm:regular Rate:Normal     Neuro/Psych negative neurological ROS  negative psych ROS   GI/Hepatic negative GI ROS, Neg liver ROS,   Endo/Other  negative endocrine ROSHypothyroidism Morbid obesity  Renal/GU negative Renal ROS  negative genitourinary   Musculoskeletal   Abdominal (+) + obese,   Peds  Hematology negative hematology ROS (+)   Anesthesia Other Findings   Reproductive/Obstetrics negative OB ROS                            Anesthesia Physical Anesthesia Plan  ASA: IV  Anesthesia Plan: General   Post-op Pain Management:    Induction: Intravenous  Airway Management Planned: Oral ETT  Additional Equipment:   Intra-op Plan:   Post-operative Plan: Extubation in OR  Informed Consent: I have reviewed the patients History and Physical, chart, labs and discussed the procedure including the risks, benefits and alternatives for the proposed anesthesia with the patient or authorized representative who has indicated his/her understanding and acceptance.   Dental Advisory Given  Plan Discussed with: CRNA and Surgeon  Anesthesia Plan Comments:        Anesthesia Quick Evaluation

## 2015-01-25 NOTE — Transfer of Care (Signed)
Immediate Anesthesia Transfer of Care Note  Patient: Rachel [name]  Procedure(s) Performed: Procedure(s): TOTAL KNEE ARTHROPLASTY (Right)  Patient Location: PACU  Anesthesia Type:General  Level of Consciousness: awake and patient cooperative  Airway & Oxygen Therapy: Patient Spontanous Breathing and Patient connected to face mask oxygen  Post-op Assessment: Report given to RN and Post -op Vital signs reviewed and stable  Post vital signs: Reviewed and stable  Last Vitals:  Filed Vitals:   01/25/15 0630  BP: 136/70  Pulse: 68  Temp: 36.5 C  Resp: 18    Complications: No apparent anesthesia complications

## 2015-01-25 NOTE — Interval H&P Note (Signed)
History and Physical Interval Note:  01/25/2015 7:59 AM  Rachel [name]  has presented today for surgery, with the diagnosis of OA RIGHT KNEE  The various methods of treatment have been discussed with the patient and family. After consideration of risks, benefits and other options for treatment, the patient has consented to  Procedure(s): TOTAL KNEE ARTHROPLASTY (Right) as a surgical intervention .  The patient's history has been reviewed, patient examined, no change in status, stable for surgery.  I have reviewed the patient's chart and labs.  Questions were answered to the patient's satisfaction.     Graham Doukas A

## 2015-01-26 LAB — CBC
HCT: 38.7 % (ref 36.0–46.0)
Hemoglobin: 12.4 g/dL (ref 12.0–15.0)
MCH: 29 pg (ref 26.0–34.0)
MCHC: 32 g/dL (ref 30.0–36.0)
MCV: 90.4 fL (ref 78.0–100.0)
PLATELETS: 179 10*3/uL (ref 150–400)
RBC: 4.28 MIL/uL (ref 3.87–5.11)
RDW: 13.1 % (ref 11.5–15.5)
WBC: 14.1 10*3/uL — AB (ref 4.0–10.5)

## 2015-01-26 LAB — BASIC METABOLIC PANEL
ANION GAP: 8 (ref 5–15)
BUN: 16 mg/dL (ref 6–20)
CO2: 30 mmol/L (ref 22–32)
CREATININE: 0.81 mg/dL (ref 0.44–1.00)
Calcium: 9.3 mg/dL (ref 8.9–10.3)
Chloride: 103 mmol/L (ref 101–111)
GFR calc non Af Amer: >60 mL/min (ref >60)
GLUCOSE: 139 mg/dL — AB (ref 65–99)
Potassium: 4.2 mmol/L (ref 3.5–5.1)
Sodium: 141 mmol/L (ref 135–145)

## 2015-01-26 MED ORDER — HYDROMORPHONE HCL 2 MG PO TABS: 2.0000 mg | ORAL_TABLET | ORAL | Status: DC | PRN

## 2015-01-26 MED ORDER — PANTOPRAZOLE SODIUM 40 MG PO TBEC
40.0000 mg | DELAYED_RELEASE_TABLET | Freq: Every day | ORAL | Status: DC
  Administered 2015-01-26 – 2015-01-27 (×2): 40 mg via ORAL
  Filled 2015-01-26 (×2): qty 1

## 2015-01-26 MED ORDER — METHOCARBAMOL 500 MG PO TABS: 500.0000 mg | ORAL_TABLET | Freq: Three times a day (TID) | ORAL | Status: DC | PRN

## 2015-01-26 NOTE — Progress Notes (Signed)
Physical Therapy Treatment Patient Details Name: NANETTA WIEGMAN MRN: [id number] DOB: [birth year] Today's Date: 01/26/2015    History of Present Illness Pt s/p R TKR with hx of L THR s/p ~ 9 yrs    PT Comments    Steady progress with mobility.  Follow Up Recommendations  Home health PT     Equipment Recommendations  None recommended by PT    Recommendations for Other Services OT consult     Precautions / Restrictions Precautions Precautions: Knee;Fall Required Braces or Orthoses: Knee Immobilizer - Right Knee Immobilizer - Right: Discontinue once straight leg raise with < 10 degree lag Restrictions Weight Bearing Restrictions: No Other Position/Activity Restrictions: WBAT    Mobility  Bed Mobility Overal bed mobility: Needs Assistance Bed Mobility: Sit to Supine       Sit to supine: Min assist;Mod assist   General bed mobility comments: cues for sequence and use of L LE to self assist  Transfers Overall transfer level: Needs assistance Equipment used: Rolling walker (2 wheeled) Transfers: Sit to/from Stand Sit to Stand: +2 safety/equipment;From elevated surface;Min assist         General transfer comment: cues for LE management and use of UEs to self assist.  Pt from chair, to/from [facility] and to bedside  Ambulation/Gait Ambulation/Gait assistance: Min assist Ambulation Distance (Feet): 17 Feet (twice) Assistive device: Rolling walker (2 wheeled) Gait Pattern/deviations: Step-to pattern;Decreased step length - right;Decreased step length - left;Shuffle;Trunk flexed;Antalgic Gait velocity: decr   General Gait Details: cues for sequence, posture, position from RW.    Stairs            Wheelchair Mobility    Modified Rankin (Stroke Patients Only)       Balance                                    Cognition Arousal/Alertness: Awake/alert Behavior During Therapy: WFL for tasks assessed/performed Overall Cognitive Status: Within  Functional Limits for tasks assessed                      Exercises Total Joint Exercises Ankle Circles/Pumps: AROM;15 reps;Supine;Both Quad Sets: AROM;Both;10 reps;Supine Heel Slides: AAROM;15 reps;Supine;Right Straight Leg Raises: AAROM;10 reps;Supine;Right    General Comments        Pertinent Vitals/Pain Pain Assessment: 0-10 Pain Score: 3  Pain Location: R knee Pain Descriptors / Indicators: Aching;Sore Pain Intervention(s): Limited activity within patient's tolerance;Monitored during session;Premedicated before session;Ice applied    Home Living                      Prior Function            PT Goals (current goals can now be found in the care plan section) Acute Rehab PT Goals Patient Stated Goal: Resume previous lifestyle with decreased pain PT Goal Formulation: With patient Time For Goal Achievement: 01/30/15 Potential to Achieve Goals: Good Progress towards PT goals: Progressing toward goals    Frequency  7X/week    PT Plan Current plan remains appropriate    Co-evaluation             End of Session Equipment Utilized During Treatment: Gait belt;Right knee immobilizer Activity Tolerance: Patient tolerated treatment well Patient left: in bed;with call bell/phone within reach     Time: 1330-1410 PT Time Calculation (min) (ACUTE ONLY): 40 min  Charges:  $Gait Training: 8-22 mins $  Therapeutic Exercise: 8-22 mins $Therapeutic Activity: 8-22 mins                    G Codes:      Cedarius Kersh 01/26/2015, 4:58 PM

## 2015-01-26 NOTE — Discharge Instructions (Addendum)
INSTRUCTIONS AFTER JOINT REPLACEMENT  ° °o Remove items at home which could result in a fall. This includes throw rugs or furniture in walking pathways °o ICE to the affected joint every three hours while awake for 30 minutes at a time, for at least the first 3-5 days, and then as needed for pain and swelling.  Continue to use ice for pain and swelling. You may notice swelling that will progress down to the foot and ankle.  This is normal after surgery.  Elevate your leg when you are not up walking on it.   °o Continue to use the breathing machine you got in the hospital (incentive spirometer) which will help keep your temperature down.  It is common for your temperature to cycle up and down following surgery, especially at night when you are not up moving around and exerting yourself.  The breathing machine keeps your lungs expanded and your temperature down. ° ° °DIET:  As you were doing prior to hospitalization, we recommend a well-balanced diet. ° °DRESSING / WOUND CARE / SHOWERING ° °You may change your dressing 3-5 days after surgery.  Then change the dressing every day with sterile gauze.  Please use good hand washing techniques before changing the dressing.  Do not use any lotions or creams on the incision until instructed by your surgeon. ° °ACTIVITY ° °o Increase activity slowly as tolerated, but follow the weight bearing instructions below.   °o No driving for 6 weeks or until further direction given by your physician.  You cannot drive while taking narcotics.  °o No lifting or carrying greater than 10 lbs. until further directed by your surgeon. °o Avoid periods of inactivity such as sitting longer than an hour when not asleep. This helps prevent blood clots.  °o You may return to work once you are authorized by your doctor.  ° ° ° °WEIGHT BEARING  ° °Weight bearing as tolerated with assist device (walker, cane, etc) as directed, use it as long as suggested by your surgeon or therapist, typically at  least 4-6 weeks. ° ° °EXERCISES ° °Results after joint replacement surgery are often greatly improved when you follow the exercise, range of motion and muscle strengthening exercises prescribed by your doctor. Safety measures are also important to protect the joint from further injury. Any time any of these exercises cause you to have increased pain or swelling, decrease what you are doing until you are comfortable again and then slowly increase them. If you have problems or questions, call your caregiver or physical therapist for advice.  ° °Rehabilitation is important following a joint replacement. After just a few days of immobilization, the muscles of the leg can become weakened and shrink (atrophy).  These exercises are designed to build up the tone and strength of the thigh and leg muscles and to improve motion. Often times heat used for twenty to thirty minutes before working out will loosen up your tissues and help with improving the range of motion but do not use heat for the first two weeks following surgery (sometimes heat can increase post-operative swelling).  ° °These exercises can be done on a training (exercise) mat, on the floor, on a table or on a bed. Use whatever works the best and is most comfortable for you.    Use music or television while you are exercising so that the exercises are a pleasant break in your day. This will make your life better with the exercises acting as a break   in your routine that you can look forward to.   Perform all exercises about fifteen times, three times per day or as directed.  You should exercise both the operative leg and the other leg as well. ° °Exercises include: °  °• Quad Sets - Tighten up the muscle on the front of the thigh (Quad) and hold for 5-10 seconds.   °• Straight Leg Raises - With your knee straight (if you were given a brace, keep it on), lift the leg to 60 degrees, hold for 3 seconds, and slowly lower the leg.  Perform this exercise against  resistance later as your leg gets stronger.  °• Leg Slides: Lying on your back, slowly slide your foot toward your buttocks, bending your knee up off the floor (only go as far as is comfortable). Then slowly slide your foot back down until your leg is flat on the floor again.  °• Angel Wings: Lying on your back spread your legs to the side as far apart as you can without causing discomfort.  °• Hamstring Strength:  Lying on your back, push your heel against the floor with your leg straight by tightening up the muscles of your buttocks.  Repeat, but this time bend your knee to a comfortable angle, and push your heel against the floor.  You may put a pillow under the heel to make it more comfortable if necessary.  ° °A rehabilitation program following joint replacement surgery can speed recovery and prevent re-injury in the future due to weakened muscles. Contact your doctor or a physical therapist for more information on knee rehabilitation.  ° ° °CONSTIPATION ° °Constipation is defined medically as fewer than three stools per week and severe constipation as less than one stool per week.  Even if you have a regular bowel pattern at home, your normal regimen is likely to be disrupted due to multiple reasons following surgery.  Combination of anesthesia, postoperative narcotics, change in appetite and fluid intake all can affect your bowels.  ° °YOU MUST use at least one of the following options; they are listed in order of increasing strength to get the job done.  They are all available over the counter, and you may need to use some, POSSIBLY even all of these options:   ° °Drink plenty of fluids (prune juice may be helpful) and high fiber foods °Colace 100 mg by mouth twice a day  °Senokot for constipation as directed and as needed Dulcolax (bisacodyl), take with full glass of water  °Miralax (polyethylene glycol) once or twice a day as needed. ° °If you have tried all these things and are unable to have a bowel  movement in the first 3-4 days after surgery call either your surgeon or your primary doctor.   ° °If you experience loose stools or diarrhea, hold the medications until you stool forms back up.  If your symptoms do not get better within 1 week or if they get worse, check with your doctor.  If you experience "the worst abdominal pain ever" or develop nausea or vomiting, please contact the office immediately for further recommendations for treatment. ° ° °ITCHING:  If you experience itching with your medications, try taking only a single pain pill, or even half a pain pill at a time.  You can also use Benadryl over the counter for itching or also to help with sleep.  ° °TED HOSE STOCKINGS:  Use stockings on both legs until for at least 2 weeks or as   directed by physician office. They may be removed at night for sleeping.  MEDICATIONS:  See your medication summary on the After Visit Summary that nursing will review with you.  You may have some home medications which will be placed on hold until you complete the course of blood thinner medication.  It is important for you to complete the blood thinner medication as prescribed. Take aspirin 325mg  twice daily to prevent blood clots.   PRECAUTIONS:  If you experience chest pain or shortness of breath - call 911 immediately for transfer to the hospital emergency department.   If you develop a fever greater that 101 F, purulent drainage from wound, increased redness or drainage from wound, foul odor from the wound/dressing, or calf pain - CONTACT YOUR SURGEON.                                                   FOLLOW-UP APPOINTMENTS:  If you do not already have a post-op appointment, please call the office for an appointment to be seen by your surgeon.  Guidelines for how soon to be seen are listed in your After Visit Summary, but are typically between 1-4 weeks after surgery.    MAKE SURE YOU:   Understand these instructions.   Get help right away if  you are not doing well or get worse.    Thank you for letting us be a part of your medical care team.  It is a privilege we respect greatly.  We hope these instructions will help you stay on track for a fast and full recovery!

## 2015-01-26 NOTE — Progress Notes (Signed)
Subjective: 1 Day Post-Op Procedure(s) (LRB): TOTAL KNEE ARTHROPLASTY (Right) Patient reports pain as 3 on 0-10 scale.Hemovac DCd. She is up ambulating.   Objective: Vital signs in last 24 hours: Temp:  [97.4 F (36.3 C)-98.6 F (37 C)] 98.3 F (36.8 C) (01/05 0600) Pulse Rate:  [57-76] 60 (01/05 0600) Resp:  [8-16] 16 (01/05 0600) BP: (97-143)/(44-84) 107/44 mmHg (01/05 0600) SpO2:  [94 %-100 %] 95 % (01/05 0600)  Intake/Output from previous day: 01/04 0701 - 01/05 0700 In: 4398.3 [P.O.:960; I.V.:3333.3; IV Piggyback:105] Out: 3055 [Urine:2875; Drains:80; Blood:100] Intake/Output this shift:     Recent Labs  01/26/15 0440  HGB 12.4    Recent Labs  01/26/15 0440  WBC 14.1*  RBC 4.28  HCT 38.7  PLT 179    Recent Labs  01/26/15 0440  NA 141  K 4.2  CL 103  CO2 30  BUN 16  CREATININE 0.81  GLUCOSE 139*  CALCIUM 9.3   No results for input(s): LABPT, INR in the last 72 hours.  Dorsiflexion/Plantar flexion intact No cellulitis present  Assessment/Plan: 1 Day Post-Op Procedure(s) (LRB): TOTAL KNEE ARTHROPLASTY (Right) Up with therapy  Landra Howze A 01/26/2015, 7:18 AM

## 2015-01-26 NOTE — Progress Notes (Signed)
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    Follow Up Recommendations  Home health PT     Equipment Recommendations  None recommended by PT    Recommendations for Other Services OT consult     Precautions / Restrictions Precautions Precautions: Knee;Fall Required Braces or Orthoses: Knee Immobilizer - Right Knee Immobilizer - Right: Discontinue once straight leg raise with < 10 degree lag Restrictions Weight Bearing Restrictions: No Other Position/Activity Restrictions: WBAT    Mobility  Bed Mobility Overal bed mobility: Needs Assistance;+2 for physical assistance;+ 2 for safety/equipment Bed Mobility: Supine to Sit     Supine to sit: Mod assist;+2 for safety/equipment     General bed mobility comments: cues for sequence and use of L LE to self assist  Transfers Overall transfer level: Needs assistance Equipment used: Rolling walker (2 wheeled) Transfers: Sit to/from Stand Sit to Stand: +2 safety/equipment;From elevated surface;Min assist         General transfer comment: cues for LE management and use of UEs to self assist  Ambulation/Gait Ambulation/Gait assistance: Min assist;Mod assist;+2 safety/equipment;+2 physical assistance Ambulation Distance (Feet): 17 Feet Assistive device: Rolling walker (2 wheeled) Gait Pattern/deviations: Step-to pattern;Decreased step length - right;Decreased step length - left;Shuffle;Trunk flexed Gait velocity: decr   General Gait Details: cues for sequence, posture, position from RW.  Ltd by fatigue and bed pulled to pt   Information systems managertairs            Wheelchair Mobility    Modified Rankin (Stroke Patients Only)       Balance                                     Cognition Arousal/Alertness: Awake/alert Behavior During Therapy: WFL for tasks assessed/performed Overall Cognitive Status: Within Functional Limits for tasks assessed                      Exercises Total Joint Exercises Ankle Circles/Pumps: AROM;15 reps;Supine;Both Quad Sets: AROM;Both;10 reps;Supine Heel Slides: AAROM;15 reps;Supine;Right Straight Leg Raises: AAROM;10 reps;Supine;Right Goniometric ROM: AAROM R knee -10- 35    General Comments        Pertinent Vitals/Pain Pain Assessment: 0-10 Pain Score: 3  Pain Location: R knee Pain Descriptors / Indicators: Aching;Sore Pain Intervention(s): Limited activity within patient's tolerance;Monitored during session;Premedicated before session;Ice applied    Home Living Family/patient expects to be discharged to:: Private residence Living Arrangements: Spouse/significant other Available Help at Discharge: Family Type of Home: Mobile home Home Access: Ramped entrance   Home Layout: One level Home Equipment: Toilet riser;Cane - single point;Walker - 2 wheels      Prior Function Level of Independence: Independent          PT Goals (current goals can now be found in the care plan section) Acute Rehab PT Goals Patient Stated Goal: Resume previous lifestyle with decreased pain PT Goal Formulation: With patient Time For Goal Achievement: 01/30/15 Potential to Achieve Goals: Good Progress towards PT goals: Progressing toward goals    Frequency  7X/week    PT Plan Current plan remains appropriate    Co-evaluation PT/OT/SLP Co-Evaluation/Treatment: Yes Reason for Co-Treatment: For  patient/therapist safety PT goals addressed during session: Mobility/safety with mobility OT goals addressed during session: ADL's and self-care     End of Session Equipment Utilized During Treatment: Gait belt;Right knee immobilizer Activity Tolerance: Patient tolerated treatment well;Patient limited by fatigue;Patient limited by  pain Patient left: with call bell/phone within reach;in chair;with family/visitor present     Time: 1478-2956 PT Time Calculation (min) (ACUTE ONLY): 38 min  Charges:  $Gait Training: 8-22 mins $Therapeutic Exercise: 8-22 mins                    G Codes:      Rachel [name] 02-07-15, 12:58 PM

## 2015-01-26 NOTE — Evaluation (Signed)
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 Pt will benefit from skilled OT to increase their safety and independence with ADL and functional mobility for ADL to facilitate discharge to venue listed below.        Follow Up Recommendations  No OT follow up    Equipment Recommendations  None recommended by OT       Precautions / Restrictions Precautions Precautions: Knee;Fall Required Braces or Orthoses: Knee Immobilizer - Right Knee Immobilizer - Right: Discontinue once straight leg raise with < 10 degree lag Restrictions Weight Bearing Restrictions: No Other Position/Activity Restrictions: WBAT      Mobility Bed Mobility Overal bed mobility: Needs Assistance;+2 for physical assistance;+ 2 for safety/equipment Bed Mobility: Supine to Sit     Supine to sit: Mod assist;+2 for safety/equipment     General bed mobility comments: cues for sequence and use of L LE to self assist  Transfers Overall transfer level: Needs assistance Equipment used: Rolling walker (2 wheeled) Transfers: Sit to/from Stand Sit to Stand: +2 safety/equipment;From elevated surface;Min assist         General transfer comment: cues for LE management and use of UEs to self assist         ADL Overall ADL's : Needs assistance/impaired     Grooming: Set up;Sitting       Lower Body Bathing: Maximal assistance;Sit to/from stand;Cueing for safety;Cueing for sequencing;Cueing for compensatory techniques       Lower Body Dressing: Sit to/from stand;Cueing for sequencing;Cueing for safety;Cueing for compensatory techniques;Maximal assistance   Toilet Transfer: RW;+2 for safety/equipment;Cueing for safety;Cueing for sequencing;Minimal  assistance;Moderate assistance Toilet Transfer Details (indicate cue type and reason): bed to chair           General ADL Comments: husband will A as needed               Pertinent Vitals/Pain Pain Score: 3  Pain Location: r knee Pain Descriptors / Indicators: Aching Pain Intervention(s): Monitored during session        Extremity/Trunk Assessment Upper Extremity Assessment Upper Extremity Assessment: Overall WFL for tasks assessed       Cervical / Trunk Assessment Cervical / Trunk Assessment: Normal   Communication Communication Communication: No difficulties   Cognition Arousal/Alertness: Awake/alert Behavior During Therapy: WFL for tasks assessed/performed Overall Cognitive Status: Within Functional Limits for tasks assessed                                Home Living Family/patient expects to be discharged to:: Private residence Living Arrangements: Spouse/significant other Available Help at Discharge: Family Type of Home: Mobile home Home Access: Ramped entrance     Home Layout: One level     Bathroom Shower/Tub: Producer, television/film/video: Standard Bathroom Accessibility: Yes   Home Equipment: Toilet riser;Cane - single point;Walker - 2 wheels          Prior Functioning/Environment Level of Independence: Independent             OT Diagnosis: Generalized weakness   OT Problem List: Decreased strength;Pain   OT Treatment/Interventions: Self-care/ADL training;DME and/or AE instruction;Visual/perceptual remediation/compensation    OT Goals(Current  goals can be found in the care plan section) Acute Rehab OT Goals Patient Stated Goal: Resume previous lifestyle with decreased pain  OT Frequency: Min 2X/week           Co-evaluation PT/OT/SLP Co-Evaluation/Treatment: Yes Reason for Co-Treatment: For patient/therapist safety   OT goals addressed during session: ADL's and self-care      End of Session Equipment  Utilized During Treatment: Rolling walker Nurse Communication: Mobility status  Activity Tolerance: Patient tolerated treatment well Patient left: in chair;with call bell/phone within reach;with family/visitor present   Time: [id number] OT Time Calculation (min): 19 min Charges:  OT General Charges $OT Visit: 1 Procedure OT Evaluation $OT Eval Low Complexity: 1 Procedure G-Codes:    Einar CrowEDDING, Travis Purk D 01/26/2015, 10:24 AM

## 2015-01-27 LAB — BASIC METABOLIC PANEL
ANION GAP: 6 (ref 5–15)
BUN: 23 mg/dL — ABNORMAL HIGH (ref 6–20)
CHLORIDE: 104 mmol/L (ref 101–111)
CO2: 31 mmol/L (ref 22–32)
Calcium: 9.5 mg/dL (ref 8.9–10.3)
Creatinine, Ser: 0.74 mg/dL (ref 0.44–1.00)
GFR calc Af Amer: >60 mL/min (ref >60)
GLUCOSE: 104 mg/dL — AB (ref 65–99)
POTASSIUM: 4.1 mmol/L (ref 3.5–5.1)
Sodium: 141 mmol/L (ref 135–145)

## 2015-01-27 LAB — CBC
HCT: 37.4 % (ref 36.0–46.0)
HEMOGLOBIN: 11.8 g/dL — AB (ref 12.0–15.0)
MCH: 29.6 pg (ref 26.0–34.0)
MCHC: 31.6 g/dL (ref 30.0–36.0)
MCV: 94 fL (ref 78.0–100.0)
PLATELETS: 169 10*3/uL (ref 150–400)
RBC: 3.98 MIL/uL (ref 3.87–5.11)
RDW: 13.5 % (ref 11.5–15.5)
WBC: 10.3 10*3/uL (ref 4.0–10.5)

## 2015-01-27 NOTE — Progress Notes (Signed)
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  Reviewed with pt:  Therex, don/doff KI, and step.  Follow Up Recommendations  Home health PT     Equipment Recommendations  None recommended by PT    Recommendations for Other Services OT consult     Precautions / Restrictions Precautions Precautions: Knee;Fall Required Braces or Orthoses: Knee Immobilizer - Right Knee Immobilizer - Right: Discontinue once straight leg raise with < 10 degree lag Restrictions Weight Bearing Restrictions: No Other Position/Activity Restrictions: WBAT    Mobility  Bed Mobility Overal bed mobility: Needs Assistance Bed Mobility: Supine to Sit     Supine to sit: Min assist     General bed mobility comments: pt in chair  Transfers Overall transfer level: Needs assistance Equipment used: Rolling walker (2 wheeled) Transfers: Sit to/from Stand Sit to Stand: Min assist         General transfer comment: VC for safety, UE placement  Ambulation/Gait Ambulation/Gait assistance: Min guard Ambulation Distance (Feet): 38 Feet (and 15) Assistive device: Rolling walker (2 wheeled) Gait Pattern/deviations: Step-to pattern;Decreased step length - right;Decreased step length - left;Shuffle;Trunk flexed Gait velocity: decr   General Gait Details: cues for sequence, posture, position from RW.    Stairs Stairs: Yes Stairs assistance: Min assist Stair Management: No rails;Step to pattern;Backwards;With walker Number of Stairs: 1 General stair comments: single step bkwd to use stool into high bed; cues for sequence  Wheelchair Mobility    Modified Rankin (Stroke Patients Only)       Balance                                    Cognition Arousal/Alertness: Awake/alert Behavior  During Therapy: WFL for tasks assessed/performed Overall Cognitive Status: Within Functional Limits for tasks assessed                      Exercises Total Joint Exercises Ankle Circles/Pumps: AROM;15 reps;Supine;Both Quad Sets: AROM;Both;Supine;15 reps Heel Slides: AAROM;Supine;Right;10 reps Straight Leg Raises: AAROM;Supine;Right;20 reps Goniometric ROM: AAROM R knee -5 - 35    General Comments        Pertinent Vitals/Pain Pain Assessment: 0-10 Pain Score: 5  Pain Location: r knee Pain Descriptors / Indicators: Sore Pain Intervention(s): Monitored during session;Repositioned;Ice applied    Home Living                      Prior Function            PT Goals (current goals can now be found in the care plan section) Acute Rehab PT Goals Patient Stated Goal: Resume previous lifestyle with decreased pain PT Goal Formulation: With patient Time For Goal Achievement: 01/30/15 Potential to Achieve Goals: Good Progress towards PT goals: Progressing toward goals    Frequency  7X/week    PT Plan Current plan remains appropriate    Co-evaluation             End of Session Equipment Utilized During Treatment: Right knee immobilizer Activity Tolerance: Patient tolerated treatment well Patient left: in chair;with call bell/phone within reach     Time: 0810-0852 PT Time Calculation (min) (ACUTE ONLY): 42 min  Charges:  $Gait Training: 8-22 mins $Therapeutic Exercise: 8-22 mins $Therapeutic Activity: 8-22 mins                    G Codes:      Rachel [name] 02/19/15, 11:17 AM

## 2015-01-27 NOTE — Progress Notes (Signed)
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   OT comments  Pt feels ready for DC and states husband will A with ADL activity , but pt is aware of technique for LB dressing and safety with toileting           Precautions / Restrictions Precautions Precautions: Knee;Fall Required Braces or Orthoses: Knee Immobilizer - Right Knee Immobilizer - Right: Discontinue once straight leg raise with < 10 degree lag Restrictions Weight Bearing Restrictions: No Other Position/Activity Restrictions: WBAT       Mobility Bed Mobility               General bed mobility comments: pt in chair  Transfers Overall transfer level: Needs assistance Equipment used: Rolling walker (2 wheeled) Transfers: Sit to/from Stand Sit to Stand: Min assist         General transfer comment: VC for safety, UE placement    Balance                                   ADL Overall ADL's : Needs assistance/impaired     Grooming: Supervision/safety;Standing       Lower Body Bathing: Moderate assistance;Sit to/from stand;Cueing for safety;Cueing for sequencing Lower Body Bathing Details (indicate cue type and reason): pt states husband will A     Lower Body Dressing: Sit to/from stand;Cueing for sequencing;Cueing for safety;Cueing for compensatory techniques       Toileting- Clothing Manipulation and Hygiene: Minimal assistance;Sit to/from stand;Cueing for safety Toileting - Clothing Manipulation Details (indicate cue type and reason): educated to keep 1 hand on walker while pulling up pants for safety       General ADL Comments: husband will A as needed                Cognition   Behavior During Therapy: WFL for tasks assessed/performed Overall Cognitive Status: Within Functional Limits for tasks assessed                                     Pertinent Vitals/ Pain       Pain Score: 5  Pain Location: r knee Pain Descriptors / Indicators: Sore Pain Intervention(s): Monitored during session;Repositioned;Ice applied     Prior Functioning/Environment                 Progress Toward Goals  OT Goals(current goals can now be found in the care plan section)      progressing  Plan         End of Session  pt in chair with call bell with in reach                 Time: 1003-1026 OT Time Calculation (min): 23 min  Charges: OT Treatments $Self Care/Home Management : 23-37 mins  Estel Tonelli, Metro KungLorraine D 01/27/2015, 10:32 AM

## 2015-01-27 NOTE — Progress Notes (Signed)
Subjective: 2 Days Post-Op Procedure(s) (LRB): TOTAL KNEE ARTHROPLASTY (Right) Patient reports pain as 2 on 0-10 scale.Doing very well. No post-Op problems. Will DC on Aspirin.    Objective: Vital signs in last 24 hours: Temp:  [98.3 F (36.8 C)-98.6 F (37 C)] 98.6 F (37 C) (01/06 0507) Pulse Rate:  [62-70] 70 (01/06 0507) Resp:  [16] 16 (01/06 0507) BP: (102-124)/(51-59) 102/51 mmHg (01/06 0507) SpO2:  [93 %-97 %] 93 % (01/06 0507)  Intake/Output from previous day: 01/05 0701 - 01/06 0700 In: 1529.3 [P.O.:720; I.V.:809.3] Out: 2325 [Urine:2325] Intake/Output this shift:     Recent Labs  01/26/15 0440 01/27/15 0515  HGB 12.4 11.8*    Recent Labs  01/26/15 0440 01/27/15 0515  WBC 14.1* 10.3  RBC 4.28 3.98  HCT 38.7 37.4  PLT 179 169    Recent Labs  01/26/15 0440 01/27/15 0515  NA 141 141  K 4.2 4.1  CL 103 104  CO2 30 31  BUN 16 23*  CREATININE 0.81 0.74  GLUCOSE 139* 104*  CALCIUM 9.3 9.5   No results for input(s): LABPT, INR in the last 72 hours.  Dorsiflexion/Plantar flexion intact No cellulitis present Compartment soft  Assessment/Plan: 2 Days Post-Op Procedure(s) (LRB): TOTAL KNEE ARTHROPLASTY (Right) Up with therapy Discharge home with home health  Rachel [name] A 01/27/2015, 7:16 AM

## 2015-01-29 NOTE — Discharge Summary (Signed)
Physician Discharge Summary   Patient ID: Rachel [name] MRN: [id number] DOB/AGE: 58-Sep-1957 58 y.o.  Admit date: 01/25/2015 Discharge date: 01/27/2015  Primary Diagnosis: Primary osteoarthritis right knee   Admission Diagnoses:  Past Medical History  Diagnosis Date  . Hypertension   . Asthma   . Pneumonia     hx of 2010   . Hypothyroidism   . GERD (gastroesophageal reflux disease)   . Arthritis   . Anemia   . Headache     occasional    Discharge Diagnoses:   Active Problems:   History of total knee arthroplasty  Estimated body mass index is 51.68 kg/(m^2) as calculated from the following:   Height as of this encounter: 5' 6.5" (1.689 m).   Weight as of this encounter: 147.419 kg (325 lb).  Procedure:  Procedure(s) (LRB): TOTAL KNEE ARTHROPLASTY (Right)   Consults: None  HPI: Rachel [name], 60 y.o. female, has a history of pain and functional disability in the right knee due to trauma and arthritis and has failed non-surgical conservative treatments for greater than 12 weeks to includeNSAID's and/or analgesics, corticosteriod injections, flexibility and strengthening excercises, use of assistive devices and activity modification. Onset of symptoms was gradual, starting 2 years ago with gradually worsening course since that time. The patient noted prior procedures on the knee to include arthroscopy and menisectomy on the right knee(s). Patient currently rates pain in the right knee(s) at 8 out of 10 with activity. Patient has night pain, worsening of pain with activity and weight bearing, pain that interferes with activities of daily living, pain with passive range of motion, crepitus and joint swelling. Patient has evidence of periarticular osteophytes and joint space narrowing by imaging studies. There is no active infection.  Laboratory Data: Admission on 01/25/2015, Discharged on 01/27/2015  Component Date Value Ref Range Status  . ABO/RH(D) 01/25/2015 AB NEG   Final    . Antibody Screen 01/25/2015 NEG   Final  . Sample Expiration 01/25/2015 01/28/2015   Final  . WBC 01/26/2015 14.1* 4.0 - 10.5 K/uL Final  . RBC 01/26/2015 4.28  3.87 - 5.11 MIL/uL Final  . Hemoglobin 01/26/2015 12.4  12.0 - 15.0 g/dL Final  . HCT 01/26/2015 38.7  36.0 - 46.0 % Final  . MCV 01/26/2015 90.4  78.0 - 100.0 fL Final  . MCH 01/26/2015 29.0  26.0 - 34.0 pg Final  . MCHC 01/26/2015 32.0  30.0 - 36.0 g/dL Final  . RDW 01/26/2015 13.1  11.5 - 15.5 % Final  . Platelets 01/26/2015 179  150 - 400 K/uL Final  . Sodium 01/26/2015 141  135 - 145 mmol/L Final  . Potassium 01/26/2015 4.2  3.5 - 5.1 mmol/L Final  . Chloride 01/26/2015 103  101 - 111 mmol/L Final  . CO2 01/26/2015 30  22 - 32 mmol/L Final  . Glucose, Bld 01/26/2015 139* 65 - 99 mg/dL Final  . BUN 01/26/2015 16  6 - 20 mg/dL Final  . Creatinine, Ser 01/26/2015 0.81  0.44 - 1.00 mg/dL Final  . Calcium 01/26/2015 9.3  8.9 - 10.3 mg/dL Final  . GFR calc non Af Amer 01/26/2015 >60  >60 mL/min Final  . GFR calc Af Amer 01/26/2015 >60  >60 mL/min Final   Comment: (NOTE) The eGFR has been calculated using the CKD EPI equation. This calculation has not been validated in all clinical situations. eGFR's persistently <60 mL/min signify possible Chronic Kidney Disease.   . Anion gap 01/26/2015 8  5 -  15 Final  . WBC 01/27/2015 10.3  4.0 - 10.5 K/uL Final  . RBC 01/27/2015 3.98  3.87 - 5.11 MIL/uL Final  . Hemoglobin 01/27/2015 11.8* 12.0 - 15.0 g/dL Final  . HCT 01/27/2015 37.4  36.0 - 46.0 % Final  . MCV 01/27/2015 94.0  78.0 - 100.0 fL Final  . MCH 01/27/2015 29.6  26.0 - 34.0 pg Final  . MCHC 01/27/2015 31.6  30.0 - 36.0 g/dL Final  . RDW 01/27/2015 13.5  11.5 - 15.5 % Final  . Platelets 01/27/2015 169  150 - 400 K/uL Final  . Sodium 01/27/2015 141  135 - 145 mmol/L Final  . Potassium 01/27/2015 4.1  3.5 - 5.1 mmol/L Final  . Chloride 01/27/2015 104  101 - 111 mmol/L Final  . CO2 01/27/2015 31  22 - 32 mmol/L Final  .  Glucose, Bld 01/27/2015 104* 65 - 99 mg/dL Final  . BUN 01/27/2015 23* 6 - 20 mg/dL Final  . Creatinine, Ser 01/27/2015 0.74  0.44 - 1.00 mg/dL Final  . Calcium 01/27/2015 9.5  8.9 - 10.3 mg/dL Final  . GFR calc non Af Amer 01/27/2015 >60  >60 mL/min Final  . GFR calc Af Amer 01/27/2015 >60  >60 mL/min Final   Comment: (NOTE) The eGFR has been calculated using the CKD EPI equation. This calculation has not been validated in all clinical situations. eGFR's persistently <60 mL/min signify possible Chronic Kidney Disease.   Georgiann Hahn gap 01/27/2015 6  5 - 15 Final  Hospital Outpatient Visit on 01/12/2015  Component Date Value Ref Range Status  . aPTT 01/12/2015 28  24 - 37 seconds Final  . WBC 01/12/2015 8.0  4.0 - 10.5 K/uL Final  . RBC 01/12/2015 4.64  3.87 - 5.11 MIL/uL Final  . Hemoglobin 01/12/2015 13.6  12.0 - 15.0 g/dL Final  . HCT 01/12/2015 41.6  36.0 - 46.0 % Final  . MCV 01/12/2015 89.7  78.0 - 100.0 fL Final  . MCH 01/12/2015 29.3  26.0 - 34.0 pg Final  . MCHC 01/12/2015 32.7  30.0 - 36.0 g/dL Final  . RDW 01/12/2015 13.1  11.5 - 15.5 % Final  . Platelets 01/12/2015 182  150 - 400 K/uL Final  . Neutrophils Relative % 01/12/2015 54   Final  . Neutro Abs 01/12/2015 4.3  1.7 - 7.7 K/uL Final  . Lymphocytes Relative 01/12/2015 33   Final  . Lymphs Abs 01/12/2015 2.7  0.7 - 4.0 K/uL Final  . Monocytes Relative 01/12/2015 11   Final  . Monocytes Absolute 01/12/2015 0.9  0.1 - 1.0 K/uL Final  . Eosinophils Relative 01/12/2015 2   Final  . Eosinophils Absolute 01/12/2015 0.2  0.0 - 0.7 K/uL Final  . Basophils Relative 01/12/2015 0   Final  . Basophils Absolute 01/12/2015 0.0  0.0 - 0.1 K/uL Final  . Sodium 01/12/2015 143  135 - 145 mmol/L Final  . Potassium 01/12/2015 4.2  3.5 - 5.1 mmol/L Final  . Chloride 01/12/2015 106  101 - 111 mmol/L Final  . CO2 01/12/2015 28  22 - 32 mmol/L Final  . Glucose, Bld 01/12/2015 97  65 - 99 mg/dL Final  . BUN 01/12/2015 19  6 - 20 mg/dL Final   . Creatinine, Ser 01/12/2015 0.75  0.44 - 1.00 mg/dL Final  . Calcium 01/12/2015 9.5  8.9 - 10.3 mg/dL Final  . Total Protein 01/12/2015 7.0  6.5 - 8.1 g/dL Final  . Albumin 01/12/2015 3.9  3.5 - 5.0  g/dL Final  . AST 01/12/2015 33  15 - 41 U/L Final  . ALT 01/12/2015 31  14 - 54 U/L Final  . Alkaline Phosphatase 01/12/2015 57  38 - 126 U/L Final  . Total Bilirubin 01/12/2015 0.9  0.3 - 1.2 mg/dL Final  . GFR calc non Af Amer 01/12/2015 >60  >60 mL/min Final  . GFR calc Af Amer 01/12/2015 >60  >60 mL/min Final   Comment: (NOTE) The eGFR has been calculated using the CKD EPI equation. This calculation has not been validated in all clinical situations. eGFR's persistently <60 mL/min signify possible Chronic Kidney Disease.   . Anion gap 01/12/2015 9  5 - 15 Final  . Prothrombin Time 01/12/2015 13.8  11.6 - 15.2 seconds Final  . INR 01/12/2015 1.04  0.00 - 1.49 Final  . Color, Urine 01/12/2015 YELLOW  YELLOW Final  . APPearance 01/12/2015 CLOUDY* CLEAR Final  . Specific Gravity, Urine 01/12/2015 1.025  1.005 - 1.030 Final  . pH 01/12/2015 6.0  5.0 - 8.0 Final  . Glucose, UA 01/12/2015 NEGATIVE  NEGATIVE mg/dL Final  . Hgb urine dipstick 01/12/2015 NEGATIVE  NEGATIVE Final  . Bilirubin Urine 01/12/2015 NEGATIVE  NEGATIVE Final  . Ketones, ur 01/12/2015 NEGATIVE  NEGATIVE mg/dL Final  . Protein, ur 01/12/2015 NEGATIVE  NEGATIVE mg/dL Final  . Nitrite 01/12/2015 NEGATIVE  NEGATIVE Final  . Leukocytes, UA 01/12/2015 SMALL* NEGATIVE Final  . MRSA, PCR 01/12/2015 NEGATIVE  NEGATIVE Final  . Staphylococcus aureus 01/12/2015 NEGATIVE  NEGATIVE Final   Comment:        The Xpert SA Assay (FDA approved for NASAL specimens in patients over 59 years of age), is one component of a comprehensive surveillance program.  Test performance has been validated by [facility] for patients greater than or equal to 18 year old. It is not intended to diagnose infection nor to guide or monitor  treatment.   . Squamous Epithelial / LPF 01/12/2015 6-30* NONE SEEN Final  . WBC, UA 01/12/2015 6-30  0 - 5 WBC/hpf Final  . RBC / HPF 01/12/2015 0-5  0 - 5 RBC/hpf Final  . Bacteria, UA 01/12/2015 FEW* NONE SEEN Final  . Urine-Other 01/12/2015 MUCOUS PRESENT   Final     X-Rays:Dg Chest 2 View  01/12/2015  CLINICAL DATA:  Preoperative examination prior to knee surgery; history of asthma and hypertension; nonsmoker. EXAM: CHEST  2 VIEW COMPARISON:  PA and lateral chest x-ray of Jun 03, 2013 FINDINGS: The lungs are adequately inflated. There is no focal infiltrate. There is no pleural effusion. The cardiac silhouette is enlarged but stable. The pulmonary vascularity is not engorged. The mediastinum is normal in width. There is mild stable tortuosity of the descending thoracic aorta. 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 IMPRESSION: Stable a cardiomegaly without evidence of CHF. There is no acute cardiopulmonary abnormality. Electronically Signed   By: David  Martinique M.D.   On: 01/12/2015 11:36    EKG: Orders placed or performed during the hospital encounter of 01/12/15  . EKG  . EKG     Hospital Course: Rachel [name] is a 59 y.o. who was admitted to [facility]. They were brought to the operating room on 01/25/2015 and underwent Procedure(s): TOTAL KNEE ARTHROPLASTY.  Patient tolerated the procedure well and was later transferred to the recovery room and then to the orthopaedic floor for postoperative care.  They were given PO and IV analgesics for pain control following their  surgery.  They were given 24 hours of postoperative antibiotics of  Anti-infectives    Start     Dose/Rate Route Frequency Ordered Stop   01/25/15 1500  ceFAZolin (ANCEF) IVPB 1 g/50 mL premix     1 g 100 mL/hr over 30 Minutes Intravenous Every 6 hours 01/25/15 1220 01/25/15 2136   01/25/15 0957  polymyxin B 500,000 Units, bacitracin 50,000 Units in sodium chloride  irrigation 0.9 % 500 mL irrigation  Status:  Discontinued       As needed 01/25/15 0958 01/25/15 1058   01/25/15 0600  ceFAZolin (ANCEF) 3 g in dextrose 5 % 50 mL IVPB     3 g 160 mL/hr over 30 Minutes Intravenous On call to O.R. 01/24/15 1344 01/25/15 0843     and started on DVT prophylaxis in the form of Xarelto.   PT and OT were ordered for total joint protocol.  Discharge planning consulted to help with postop disposition and equipment needs.  Patient had a good night on the evening of surgery.  They started to get up OOB with therapy on day one. Hemovac drain was pulled without difficulty.  Continued to work with therapy into day two.  Dressing was changed on day two and the incision was clean and dry.  Incision was healing well.  Patient was seen in rounds and was ready to go home. Upon discharge, patient was transitioned off of Xarelto and would take aspirin for DVT prophylaxis at home.    Diet: Cardiac diet Activity:WBAT Follow-up:in 2 weeks Disposition - Home Discharged Condition: stable   Discharge Instructions    Call MD / Call 911    Complete by:  As directed   If you experience chest pain or shortness of breath, CALL 911 and be transported to the hospital emergency room.  If you develope a fever above 101 F, pus (white drainage) or increased drainage or redness at the wound, or calf pain, call your surgeon's office.     Constipation Prevention    Complete by:  As directed   Drink plenty of fluids.  Prune juice may be helpful.  You may use a stool softener, such as Colace (over the counter) 100 mg twice a day.  Use MiraLax (over the counter) for constipation as needed.     Diet - low sodium heart healthy    Complete by:  As directed      Discharge instructions    Complete by:  As directed   INSTRUCTIONS AFTER JOINT REPLACEMENT   Remove items at home which could result in a fall. This includes throw rugs or furniture in walking pathways ICE to the affected joint every three  hours while awake for 30 minutes at a time, for at least the first 3-5 days, and then as needed for pain and swelling.  Continue to use ice for pain and swelling. You may notice swelling that will progress down to the foot and ankle.  This is normal after surgery.  Elevate your leg when you are not up walking on it.   Continue to use the breathing machine you got in the hospital (incentive spirometer) which will help keep your temperature down.  It is common for your temperature to cycle up and down following surgery, especially at night when you are not up moving around and exerting yourself.  The breathing machine keeps your lungs expanded and your temperature down.   DIET:  As you were doing prior to hospitalization, we recommend a well-balanced  diet.  DRESSING / WOUND CARE / SHOWERING  You may change your dressing 3-5 days after surgery.  Then change the dressing every day with sterile gauze.  Please use good hand washing techniques before changing the dressing.  Do not use any lotions or creams on the incision until instructed by your surgeon.  ACTIVITY  Increase activity slowly as tolerated, but follow the weight bearing instructions below.   No driving for 6 weeks or until further direction given by your physician.  You cannot drive while taking narcotics.  No lifting or carrying greater than 10 lbs. until further directed by your surgeon. Avoid periods of inactivity such as sitting longer than an hour when not asleep. This helps prevent blood clots.  You may return to work once you are authorized by your doctor.     WEIGHT BEARING   Weight bearing as tolerated with assist device (walker, cane, etc) as directed, use it as long as suggested by your surgeon or therapist, typically at least 4-6 weeks.   EXERCISES  Results after joint replacement surgery are often greatly improved when you follow the exercise, range of motion and muscle strengthening exercises prescribed by your doctor.  Safety measures are also important to protect the joint from further injury. Any time any of these exercises cause you to have increased pain or swelling, decrease what you are doing until you are comfortable again and then slowly increase them. If you have problems or questions, call your caregiver or physical therapist for advice.   Rehabilitation is important following a joint replacement. After just a few days of immobilization, the muscles of the leg can become weakened and shrink (atrophy).  These exercises are designed to build up the tone and strength of the thigh and leg muscles and to improve motion. Often times heat used for twenty to thirty minutes before working out will loosen up your tissues and help with improving the range of motion but do not use heat for the first two weeks following surgery (sometimes heat can increase post-operative swelling).   These exercises can be done on a training (exercise) mat, on the floor, on a table or on a bed. Use whatever works the best and is most comfortable for you.    Use music or television while you are exercising so that the exercises are a pleasant break in your day. This will make your life better with the exercises acting as a break in your routine that you can look forward to.   Perform all exercises about fifteen times, three times per day or as directed.  You should exercise both the operative leg and the other leg as well.  Exercises include:   Quad Sets - Tighten up the muscle on the front of the thigh (Quad) and hold for 5-10 seconds.   Straight Leg Raises - With your knee straight (if you were given a brace, keep it on), lift the leg to 60 degrees, hold for 3 seconds, and slowly lower the leg.  Perform this exercise against resistance later as your leg gets stronger.  Leg Slides: Lying on your back, slowly slide your foot toward your buttocks, bending your knee up off the floor (only go as far as is comfortable). Then slowly slide your  foot back down until your leg is flat on the floor again.  Angel Wings: Lying on your back spread your legs to the side as far apart as you can without causing discomfort.  Hamstring Strength:  Lying  on your back, push your heel against the floor with your leg straight by tightening up the muscles of your buttocks.  Repeat, but this time bend your knee to a comfortable angle, and push your heel against the floor.  You may put a pillow under the heel to make it more comfortable if necessary.   A rehabilitation program following joint replacement surgery can speed recovery and prevent re-injury in the future due to weakened muscles. Contact your doctor or a physical therapist for more information on knee rehabilitation.    CONSTIPATION  Constipation is defined medically as fewer than three stools per week and severe constipation as less than one stool per week.  Even if you have a regular bowel pattern at home, your normal regimen is likely to be disrupted due to multiple reasons following surgery.  Combination of anesthesia, postoperative narcotics, change in appetite and fluid intake all can affect your bowels.   YOU MUST use at least one of the following options; they are listed in order of increasing strength to get the job done.  They are all available over the counter, and you may need to use some, POSSIBLY even all of these options:    Drink plenty of fluids (prune juice may be helpful) and high fiber foods Colace 100 mg by mouth twice a day  Senokot for constipation as directed and as needed Dulcolax (bisacodyl), take with full glass of water  Miralax (polyethylene glycol) once or twice a day as needed.  If you have tried all these things and are unable to have a bowel movement in the first 3-4 days after surgery call either your surgeon or your primary doctor.    If you experience loose stools or diarrhea, hold the medications until you stool forms back up.  If your symptoms do not get  better within 1 week or if they get worse, check with your doctor.  If you experience "the worst abdominal pain ever" or develop nausea or vomiting, please contact the office immediately for further recommendations for treatment.   ITCHING:  If you experience itching with your medications, try taking only a single pain pill, or even half a pain pill at a time.  You can also use Benadryl over the counter for itching or also to help with sleep.   TED HOSE STOCKINGS:  Use stockings on both legs until for at least 2 weeks or as directed by physician office. They may be removed at night for sleeping.  MEDICATIONS:  See your medication summary on the "After Visit Summary" that nursing will review with you.  You may have some home medications which will be placed on hold until you complete the course of blood thinner medication.  It is important for you to complete the blood thinner medication as prescribed. Take aspirin 326m twice daily to prevent blood clots.   PRECAUTIONS:  If you experience chest pain or shortness of breath - call 911 immediately for transfer to the hospital emergency department.   If you develop a fever greater that 101 F, purulent drainage from wound, increased redness or drainage from wound, foul odor from the wound/dressing, or calf pain - CONTACT YOUR SURGEON.                                                   FOLLOW-UP APPOINTMENTS:  If you do not already have a post-op appointment, please call the office for an appointment to be seen by your surgeon.  Guidelines for how soon to be seen are listed in your "After Visit Summary", but are typically between 1-4 weeks after surgery.    MAKE SURE YOU:  Understand these instructions.  Get help right away if you are not doing well or get worse.    Thank you for letting us be a part of your medical care team.  It is a privilege we respect greatly.  We hope these instructions will help you stay on track for a fast and full recovery!       Increase activity slowly as tolerated    Complete by:  As directed             Medication List    STOP taking these medications        aspirin EC 81 MG tablet      TAKE these medications        acetaminophen 500 MG tablet  Commonly known as:  TYLENOL  Take 1,000 mg by mouth every 6 (six) hours as needed for mild pain.     albuterol 108 (90 Base) MCG/ACT inhaler  Commonly known as:  PROVENTIL HFA;VENTOLIN HFA  Inhale 1 puff into the lungs every 6 (six) hours as needed for wheezing or shortness of breath.     atenolol 100 MG tablet  Commonly known as:  TENORMIN  Take 100 mg by mouth every morning.     ferrous sulfate 325 (65 FE) MG tablet  Take 325 mg by mouth daily with breakfast.     fexofenadine 180 MG tablet  Commonly known as:  ALLEGRA  Take 180 mg by mouth daily.     fluticasone 50 MCG/ACT nasal spray  Commonly known as:  FLONASE  Place 2 sprays into both nostrils at bedtime.     HYDROmorphone 2 MG tablet  Commonly known as:  DILAUDID  Take 1-2 tablets (2-4 mg total) by mouth every 4 (four) hours as needed for moderate pain.     levothyroxine 137 MCG tablet  Commonly known as:  SYNTHROID, LEVOTHROID  Take 137 mcg by mouth daily before breakfast.     meloxicam 7.5 MG tablet  Commonly known as:  MOBIC  Take 7.5 mg by mouth 2 (two) times daily.     methocarbamol 500 MG tablet  Commonly known as:  ROBAXIN  Take 1 tablet (500 mg total) by mouth every 8 (eight) hours as needed for muscle spasms.     multivitamin with minerals Tabs tablet  Take 1 tablet by mouth daily.     NON FORMULARY  every 30 (thirty) days. Allergy shot To have on 01/19/2015     omeprazole 20 MG capsule  Commonly known as:  PRILOSEC  Take 20 mg by mouth daily.     rOPINIRole 0.25 MG tablet  Commonly known as:  REQUIP  Take 0.5 mg by mouth at bedtime.     traMADol 50 MG tablet  Commonly known as:  ULTRAM  Take 50 mg by mouth every 6 (six) hours as needed (pain).      Vitamin D 2000 units tablet  Take 2,000 Units by mouth daily.           Follow-up Information    Follow up with GIOFFRE,RONALD A, MD. Schedule an appointment as soon as possible for a visit in 2 weeks.   Specialty:  Orthopedic Surgery  Contact information:   [address] [place] [address] [phone]       Follow up with [facility].   Why:  HHPT has been ordered by your Physician. A representative will call to schedule your initial visit.   Contact information:   [place] [address] [phone]       Signed: Ardeen Jourdain, PA-C Orthopaedic Surgery 01/29/2015, 5:45 PM

## 2015-08-07 ENCOUNTER — Ambulatory Visit
Admission: RE | Admit: 2015-08-07 | Discharge: 2015-08-07 | Disposition: A | Discharge status: Home / Self Care | Payer: Disability Insurance | Source: Ambulatory Visit | Attending: Pediatrics | Admitting: Pediatrics

## 2015-08-07 ENCOUNTER — Other Ambulatory Visit: Payer: Self-pay | Admitting: Pediatrics

## 2015-08-07 DIAGNOSIS — M549 Dorsalgia, unspecified: Secondary | ICD-10-CM

## 2015-08-07 DIAGNOSIS — M545 Low back pain: Secondary | ICD-10-CM | POA: Insufficient documentation

## 2015-08-07 DIAGNOSIS — M5135 Other intervertebral disc degeneration, thoracolumbar region: Secondary | ICD-10-CM | POA: Insufficient documentation

## 2015-08-07 DIAGNOSIS — M25559 Pain in unspecified hip: Secondary | ICD-10-CM

## 2015-08-07 DIAGNOSIS — Z96642 Presence of left artificial hip joint: Secondary | ICD-10-CM | POA: Insufficient documentation

## 2015-08-07 DIAGNOSIS — N200 Calculus of kidney: Secondary | ICD-10-CM | POA: Diagnosis not present

## 2015-08-07 DIAGNOSIS — M5136 Other intervertebral disc degeneration, lumbar region: Secondary | ICD-10-CM | POA: Diagnosis not present

## 2015-08-07 DIAGNOSIS — M25552 Pain in left hip: Secondary | ICD-10-CM | POA: Insufficient documentation

## 2015-09-27 ENCOUNTER — Encounter: Payer: Self-pay | Admitting: *Deleted

## 2015-09-29 DIAGNOSIS — J3089 Other allergic rhinitis: Secondary | ICD-10-CM | POA: Diagnosis not present

## 2015-09-29 NOTE — Progress Notes (Signed)
Immunotherapy   Patient Details  Name: Rachel [name] MRN: [id number] Date of Birth: [birth year]  09/29/2015   Maintenance vial made for Mold. Exp 09/26/16.   Vella RedheadHeather Clark 09/29/2015, 9:24 AM

## 2015-10-05 ENCOUNTER — Ambulatory Visit (INDEPENDENT_AMBULATORY_CARE_PROVIDER_SITE_OTHER): Payer: BC Managed Care – PPO | Admitting: *Deleted

## 2015-10-05 DIAGNOSIS — J309 Allergic rhinitis, unspecified: Secondary | ICD-10-CM | POA: Diagnosis not present

## 2015-10-05 NOTE — Progress Notes (Signed)
Immunotherapy   Patient Details  Name: Rachel [name] MRN: [id number] Date of Birth: 1955-03-08  10/05/2015  Rachel [name] here to pick up  Red vial 1:100 <Molds> Following schedule: C  Frequency:1 time per week at 0.50 every 4 weeks.  Epi-Pen:Epi-Pen Available  Consent signed and patient instructions given. Patient receives injections from her job. No problems after 30 minutes in office.    Vella RedheadHeather Clark 10/05/2015, 6:59 PM

## 2016-02-15 NOTE — Addendum Note (Signed)
Addended by: Berna BueWHITAKER, Syed Zukas L on: 02/15/2016 10:40 AM   Modules accepted: Orders

## 2016-07-09 NOTE — Progress Notes (Signed)
VIALS EXP 07-12-17  HC/JM 

## 2016-07-12 DIAGNOSIS — J3089 Other allergic rhinitis: Secondary | ICD-10-CM | POA: Diagnosis not present

## 2016-07-22 ENCOUNTER — Telehealth: Payer: Self-pay

## 2016-07-22 NOTE — Telephone Encounter (Signed)
Rachel HumblesAngela G Ayon here to pick up  Red vial 1:100 <Molds> Following schedule: C  Frequency:1 time per week at 0.50 every 4 weeks.  Epi-Pen:Epi-Pen Available  Consent signed and patient instructions given. Patient receives injections from her job. Pt received paperwork and took vial with her.   No injection given in office today.

## 2016-08-18 DIAGNOSIS — G4733 Obstructive sleep apnea (adult) (pediatric): Secondary | ICD-10-CM | POA: Insufficient documentation

## 2016-09-21 HISTORY — PX: ESOPHAGOGASTRODUODENOSCOPY: SHX1529

## 2016-10-21 DIAGNOSIS — Z9884 Bariatric surgery status: Secondary | ICD-10-CM

## 2016-10-21 HISTORY — DX: Bariatric surgery status: Z98.84

## 2016-11-29 ENCOUNTER — Other Ambulatory Visit: Payer: Self-pay | Admitting: Urology

## 2017-01-10 ENCOUNTER — Other Ambulatory Visit: Payer: Self-pay

## 2017-01-10 ENCOUNTER — Encounter (HOSPITAL_BASED_OUTPATIENT_CLINIC_OR_DEPARTMENT_OTHER): Payer: Self-pay | Admitting: *Deleted

## 2017-01-10 NOTE — Progress Notes (Signed)
SPOKE W/ PT VIA PHONE FOR PRE-OP INTERVIEW.  NPO AFTER MN.  ARRIVE AT 0930.  NEEDS HG.  WILL TAKE PROTONIX AND SYNTHROID AM DOS W/ SIPS OF WATER.

## 2017-01-20 ENCOUNTER — Ambulatory Visit (HOSPITAL_BASED_OUTPATIENT_CLINIC_OR_DEPARTMENT_OTHER): Payer: BC Managed Care – PPO | Admitting: Anesthesiology

## 2017-01-20 ENCOUNTER — Encounter (HOSPITAL_BASED_OUTPATIENT_CLINIC_OR_DEPARTMENT_OTHER): Payer: Self-pay | Admitting: Anesthesiology

## 2017-01-20 ENCOUNTER — Encounter (HOSPITAL_BASED_OUTPATIENT_CLINIC_OR_DEPARTMENT_OTHER)
Admission: RE | Disposition: A | Discharge status: Home / Self Care | Payer: Self-pay | Source: Ambulatory Visit | Attending: Urology

## 2017-01-20 ENCOUNTER — Ambulatory Visit (HOSPITAL_BASED_OUTPATIENT_CLINIC_OR_DEPARTMENT_OTHER)
Admission: RE | Admit: 2017-01-20 | Discharge: 2017-01-20 | Disposition: A | Discharge status: Home / Self Care | Payer: BC Managed Care – PPO | Source: Ambulatory Visit | Attending: Urology | Admitting: Urology

## 2017-01-20 DIAGNOSIS — E039 Hypothyroidism, unspecified: Secondary | ICD-10-CM | POA: Diagnosis not present

## 2017-01-20 DIAGNOSIS — Z9884 Bariatric surgery status: Secondary | ICD-10-CM | POA: Insufficient documentation

## 2017-01-20 DIAGNOSIS — Z885 Allergy status to narcotic agent status: Secondary | ICD-10-CM | POA: Diagnosis not present

## 2017-01-20 DIAGNOSIS — J45909 Unspecified asthma, uncomplicated: Secondary | ICD-10-CM | POA: Insufficient documentation

## 2017-01-20 DIAGNOSIS — M1990 Unspecified osteoarthritis, unspecified site: Secondary | ICD-10-CM | POA: Diagnosis not present

## 2017-01-20 DIAGNOSIS — Z91030 Bee allergy status: Secondary | ICD-10-CM | POA: Insufficient documentation

## 2017-01-20 DIAGNOSIS — Z96642 Presence of left artificial hip joint: Secondary | ICD-10-CM | POA: Diagnosis not present

## 2017-01-20 DIAGNOSIS — Z888 Allergy status to other drugs, medicaments and biological substances status: Secondary | ICD-10-CM | POA: Insufficient documentation

## 2017-01-20 DIAGNOSIS — K219 Gastro-esophageal reflux disease without esophagitis: Secondary | ICD-10-CM | POA: Insufficient documentation

## 2017-01-20 DIAGNOSIS — Z96651 Presence of right artificial knee joint: Secondary | ICD-10-CM | POA: Diagnosis not present

## 2017-01-20 DIAGNOSIS — N200 Calculus of kidney: Secondary | ICD-10-CM | POA: Insufficient documentation

## 2017-01-20 HISTORY — DX: Bariatric surgery status: Z98.84

## 2017-01-20 HISTORY — DX: Personal history of urinary calculi: Z87.442

## 2017-01-20 HISTORY — PX: HOLMIUM LASER APPLICATION: SHX5852

## 2017-01-20 HISTORY — DX: Allergic rhinitis, unspecified: J30.9

## 2017-01-20 HISTORY — DX: Migraine, unspecified, not intractable, without status migrainosus: G43.909

## 2017-01-20 HISTORY — DX: Presence of spectacles and contact lenses: Z97.3

## 2017-01-20 HISTORY — DX: Calculus of kidney: N20.0

## 2017-01-20 HISTORY — DX: Unspecified osteoarthritis, unspecified site: M19.90

## 2017-01-20 HISTORY — PX: CYSTOSCOPY WITH RETROGRADE PYELOGRAM, URETEROSCOPY AND STENT PLACEMENT: SHX5789

## 2017-01-20 HISTORY — DX: Personal history of other diseases of the digestive system: Z87.19

## 2017-01-20 HISTORY — DX: Unspecified asthma, uncomplicated: J45.909

## 2017-01-20 LAB — POCT I-STAT, CHEM 8
BUN: 13 mg/dL (ref 6–20)
CALCIUM ION: 1.28 mmol/L (ref 1.15–1.40)
CHLORIDE: 102 mmol/L (ref 101–111)
Creatinine, Ser: 0.6 mg/dL (ref 0.44–1.00)
Glucose, Bld: 88 mg/dL (ref 65–99)
HCT: 45 % (ref 36.0–46.0)
Hemoglobin: 15.3 g/dL — ABNORMAL HIGH (ref 12.0–15.0)
Potassium: 3.7 mmol/L (ref 3.5–5.1)
SODIUM: 145 mmol/L (ref 135–145)
TCO2: 31 mmol/L (ref 22–32)

## 2017-01-20 SURGERY — CYSTOURETEROSCOPY, WITH RETROGRADE PYELOGRAM AND STENT INSERTION
Anesthesia: General | Laterality: Bilateral

## 2017-01-20 MED ORDER — DEXAMETHASONE SODIUM PHOSPHATE 10 MG/ML IJ SOLN
INTRAMUSCULAR | Status: AC
  Filled 2017-01-20: qty 1

## 2017-01-20 MED ORDER — METOCLOPRAMIDE HCL 5 MG/ML IJ SOLN
INTRAMUSCULAR | Status: DC | PRN
  Administered 2017-01-20: 10 mg via INTRAVENOUS

## 2017-01-20 MED ORDER — CEFAZOLIN SODIUM-DEXTROSE 1-4 GM/50ML-% IV SOLN
INTRAVENOUS | Status: AC
  Filled 2017-01-20: qty 50

## 2017-01-20 MED ORDER — FENTANYL CITRATE (PF) 100 MCG/2ML IJ SOLN
INTRAMUSCULAR | Status: AC
  Filled 2017-01-20: qty 2

## 2017-01-20 MED ORDER — TAMSULOSIN HCL 0.4 MG PO CAPS: 0.4000 mg | ORAL_CAPSULE | Freq: Every day | ORAL | 0 refills | Status: DC

## 2017-01-20 MED ORDER — LIDOCAINE 2% (20 MG/ML) 5 ML SYRINGE
INTRAMUSCULAR | Status: DC | PRN
  Administered 2017-01-20: 100 mg via INTRAVENOUS

## 2017-01-20 MED ORDER — EPHEDRINE SULFATE-NACL 50-0.9 MG/10ML-% IV SOSY
PREFILLED_SYRINGE | INTRAVENOUS | Status: DC | PRN
  Administered 2017-01-20 (×3): 10 mg via INTRAVENOUS

## 2017-01-20 MED ORDER — DEXTROSE 5 % IV SOLN
3.0000 g | INTRAVENOUS | Status: AC
  Administered 2017-01-20: 3 g via INTRAVENOUS
  Filled 2017-01-20: qty 3000

## 2017-01-20 MED ORDER — CEFAZOLIN SODIUM-DEXTROSE 2-4 GM/100ML-% IV SOLN
INTRAVENOUS | Status: AC
  Filled 2017-01-20: qty 100

## 2017-01-20 MED ORDER — PROPOFOL 10 MG/ML IV BOLUS
INTRAVENOUS | Status: AC
  Filled 2017-01-20: qty 40

## 2017-01-20 MED ORDER — CEFAZOLIN SODIUM-DEXTROSE 2-4 GM/100ML-% IV SOLN
2.0000 g | INTRAVENOUS | Status: DC
  Filled 2017-01-20: qty 100

## 2017-01-20 MED ORDER — KETOROLAC TROMETHAMINE 30 MG/ML IJ SOLN
INTRAMUSCULAR | Status: DC | PRN
  Administered 2017-01-20: 30 mg via INTRAVENOUS

## 2017-01-20 MED ORDER — MEPERIDINE HCL 25 MG/ML IJ SOLN
6.2500 mg | INTRAMUSCULAR | Status: DC | PRN
  Filled 2017-01-20: qty 1

## 2017-01-20 MED ORDER — METOCLOPRAMIDE HCL 5 MG/ML IJ SOLN
INTRAMUSCULAR | Status: AC
  Filled 2017-01-20: qty 2

## 2017-01-20 MED ORDER — LACTATED RINGERS IV SOLN
INTRAVENOUS | Status: DC
  Administered 2017-01-20 (×2): via INTRAVENOUS
  Filled 2017-01-20: qty 1000

## 2017-01-20 MED ORDER — PROPOFOL 10 MG/ML IV BOLUS
INTRAVENOUS | Status: DC | PRN
  Administered 2017-01-20 (×2): 50 mg via INTRAVENOUS
  Administered 2017-01-20: 200 mg via INTRAVENOUS
  Administered 2017-01-20: 20 mg via INTRAVENOUS

## 2017-01-20 MED ORDER — MIDAZOLAM HCL 5 MG/5ML IJ SOLN
INTRAMUSCULAR | Status: DC | PRN
  Administered 2017-01-20: 2 mg via INTRAVENOUS

## 2017-01-20 MED ORDER — LIDOCAINE 2% (20 MG/ML) 5 ML SYRINGE
INTRAMUSCULAR | Status: AC
  Filled 2017-01-20: qty 5

## 2017-01-20 MED ORDER — EPHEDRINE 5 MG/ML INJ
INTRAVENOUS | Status: AC
  Filled 2017-01-20: qty 10

## 2017-01-20 MED ORDER — DEXAMETHASONE SODIUM PHOSPHATE 4 MG/ML IJ SOLN
INTRAMUSCULAR | Status: DC | PRN
  Administered 2017-01-20: 10 mg via INTRAVENOUS

## 2017-01-20 MED ORDER — ARTIFICIAL TEARS OPHTHALMIC OINT
TOPICAL_OINTMENT | OPHTHALMIC | Status: AC
  Filled 2017-01-20: qty 3.5

## 2017-01-20 MED ORDER — KETOROLAC TROMETHAMINE 30 MG/ML IJ SOLN
INTRAMUSCULAR | Status: AC
  Filled 2017-01-20: qty 1

## 2017-01-20 MED ORDER — ONDANSETRON HCL 4 MG/2ML IJ SOLN
4.0000 mg | Freq: Once | INTRAMUSCULAR | Status: DC | PRN
Start: 2017-01-20 — End: 2017-01-20
  Filled 2017-01-20: qty 2

## 2017-01-20 MED ORDER — FENTANYL CITRATE (PF) 100 MCG/2ML IJ SOLN
INTRAMUSCULAR | Status: DC | PRN
  Administered 2017-01-20 (×4): 50 ug via INTRAVENOUS

## 2017-01-20 MED ORDER — ONDANSETRON HCL 4 MG/2ML IJ SOLN
INTRAMUSCULAR | Status: DC | PRN
  Administered 2017-01-20: 4 mg via INTRAVENOUS

## 2017-01-20 MED ORDER — MIDAZOLAM HCL 2 MG/2ML IJ SOLN
INTRAMUSCULAR | Status: AC
  Filled 2017-01-20: qty 2

## 2017-01-20 MED ORDER — ONDANSETRON HCL 4 MG/2ML IJ SOLN
INTRAMUSCULAR | Status: AC
  Filled 2017-01-20: qty 2

## 2017-01-20 MED ORDER — OXYCODONE-ACETAMINOPHEN 5-325 MG PO TABS: 1.0000 | ORAL_TABLET | ORAL | 0 refills | Status: DC | PRN

## 2017-01-20 MED ORDER — HYDROMORPHONE HCL 1 MG/ML IJ SOLN
0.2500 mg | INTRAMUSCULAR | Status: DC | PRN
  Filled 2017-01-20: qty 0.5

## 2017-01-20 SURGICAL SUPPLY — 29 items
BAG DRAIN URO-CYSTO SKYTR STRL (DRAIN) ×2 IMPLANT
CATH INTERMIT  6FR 70CM (CATHETERS) ×2 IMPLANT
CLOTH BEACON ORANGE TIMEOUT ST (SAFETY) ×2 IMPLANT
EVACUATOR MICROVAS BLADDER (UROLOGICAL SUPPLIES) IMPLANT
EXTRACTOR STONE 1.7FRX115CM (UROLOGICAL SUPPLIES) IMPLANT
EXTRACTOR STONE NITINOL NGAGE (UROLOGICAL SUPPLIES) ×6 IMPLANT
FIBER LASER FLEXIVA 1000 (UROLOGICAL SUPPLIES) IMPLANT
FIBER LASER FLEXIVA 200 (UROLOGICAL SUPPLIES) ×2 IMPLANT
FIBER LASER FLEXIVA 365 (UROLOGICAL SUPPLIES) IMPLANT
FIBER LASER FLEXIVA 550 (UROLOGICAL SUPPLIES) IMPLANT
FIBER LASER TRAC TIP (UROLOGICAL SUPPLIES) IMPLANT
GLOVE BIO SURGEON STRL SZ8 (GLOVE) ×2 IMPLANT
GOWN STRL REUS W/TWL LRG LVL3 (GOWN DISPOSABLE) ×2 IMPLANT
GOWN STRL REUS W/TWL XL LVL3 (GOWN DISPOSABLE) ×2 IMPLANT
GUIDEWIRE ANG ZIPWIRE 038X150 (WIRE) ×2 IMPLANT
GUIDEWIRE STR DUAL SENSOR (WIRE) ×2 IMPLANT
INFUSOR MANOMETER BAG 3000ML (MISCELLANEOUS) ×2 IMPLANT
IV NS 1000ML (IV SOLUTION) ×1
IV NS 1000ML BAXH (IV SOLUTION) ×1 IMPLANT
IV NS IRRIG 3000ML ARTHROMATIC (IV SOLUTION) ×2 IMPLANT
KIT RM TURNOVER CYSTO AR (KITS) ×2 IMPLANT
MANIFOLD NEPTUNE II (INSTRUMENTS) ×2 IMPLANT
NS IRRIG 500ML POUR BTL (IV SOLUTION) ×2 IMPLANT
PACK CYSTO (CUSTOM PROCEDURE TRAY) ×2 IMPLANT
SHEATH URET ACCESS 12FR/35CM (UROLOGICAL SUPPLIES) ×2 IMPLANT
STENT URET 6FRX26 CONTOUR (STENTS) ×4 IMPLANT
SYRINGE 10CC LL (SYRINGE) ×2 IMPLANT
TUBE CONNECTING 12X1/4 (SUCTIONS) ×2 IMPLANT
TUBE FEEDING 8FR 16IN STR KANG (MISCELLANEOUS) IMPLANT

## 2017-01-20 NOTE — Transfer of Care (Signed)
  Last Vitals:  Vitals:   01/20/17 0933  BP: 128/79  Pulse: (!) 50  Resp: 16  Temp: (!) 36.4 C  SpO2: 100%    Last Pain:  Vitals:   01/20/17 0933  TempSrc: Oral      Patients Stated Pain Goal: 5 (01/20/17 0951)  Immediate Anesthesia Transfer of Care Note  Patient: Leverne Humblesngela G Hofacker  Procedure(s) Performed: Procedure(s) (LRB): CYSTOSCOPY WITH RETROGRADE PYELOGRAM, URETEROSCOPY AND STENT PLACEMENT (Bilateral) HOLMIUM LASER APPLICATION (Bilateral)  Patient Location: PACU  Anesthesia Type: General  Level of Consciousness: awake, alert  and oriented  Airway & Oxygen Therapy: Patient Spontanous Breathing and Patient connected to face mask oxygen  Post-op Assessment: Report given to PACU RN and Post -op Vital signs reviewed and stable  Post vital signs: Reviewed and stable  Complications: No apparent anesthesia complications

## 2017-01-20 NOTE — Discharge Instructions (Signed)
Ureteral Stent Implantation, Care After Refer to this sheet in the next few weeks. These instructions provide you with information about caring for yourself after your procedure. Your health care provider may also give you more specific instructions. Your treatment has been planned according to current medical practices, but problems sometimes occur. Call your health care provider if you have any problems or questions after your procedure. What can I expect after the procedure? After the procedure, it is common to have:  Nausea.  Mild pain when you urinate. You may feel this pain in your lower back or lower abdomen. Pain should stop within a few minutes after you urinate. This may last for up to 1 week.  A small amount of blood in your urine for several days.  Follow these instructions at home:  Medicines  Take over-the-counter and prescription medicines only as told by your health care provider.  If you were prescribed an antibiotic medicine, take it as told by your health care provider. Do not stop taking the antibiotic even if you start to feel better.  Do not drive for 24 hours if you received a sedative.  Do not drive or operate heavy machinery while taking prescription pain medicines.  Do not take any Ibuprofen (Advil, Motrin, aleve) until after 6:30 pm today. Activity  Return to your normal activities as told by your health care provider. Ask your health care provider what activities are safe for you.  Do not lift anything that is heavier than 10 lb (4.5 kg). Follow this limit for 1 week after your procedure, or for as long as told by your health care provider. General instructions  Watch for any blood in your urine. Call your health care provider if the amount of blood in your urine increases.  If you have a catheter: ? Follow instructions from your health care provider about taking care of your catheter and collection bag. ? Do not take baths, swim, or use a hot tub until  your health care provider approves.  Drink enough fluid to keep your urine clear or pale yellow.  Keep all follow-up visits as told by your health care provider. This is important. Contact a health care provider if:  You have pain that gets worse or does not get better with medicine, especially pain when you urinate.  You have difficulty urinating.  You feel nauseous or you vomit repeatedly during a period of more than 2 days after the procedure. Get help right away if:  Your urine is dark red or has blood clots in it.  You are leaking urine (have incontinence).  The end of the stent comes out of your urethra.  You cannot urinate.  You have sudden, sharp, or severe pain in your abdomen or lower back.  You have a fever. This information is not intended to replace advice given to you by your health care provider. Make sure you discuss any questions you have with your health care provider. Document Released: 09/09/2012 Document Revised: 06/15/2015 Document Reviewed: 07/22/2014 Elsevier Interactive Patient Education  2018 [employer]    Post Anesthesia Home Care Instructions  Activity: Get plenty of rest for the remainder of the day. A responsible individual must stay with you for 24 hours following the procedure.  For the next 24 hours, DO NOT: -Drive a car -Advertising copywriterperate machinery -Drink alcoholic beverages -Take any medication unless instructed by your physician -Make any legal decisions or sign important papers.  Meals: Start with liquid foods such as gelatin  or soup. Progress to regular foods as tolerated. Avoid greasy, spicy, heavy foods. If nausea and/or vomiting occur, drink only clear liquids until the nausea and/or vomiting subsides. Call your physician if vomiting continues.  Special Instructions/Symptoms: Your throat may feel dry or sore from the anesthesia or the breathing tube placed in your throat during surgery. If this causes discomfort, gargle with warm salt  water. The discomfort should disappear within 24 hours.  If you had a scopolamine patch placed behind your ear for the management of post- operative nausea and/or vomiting:  1. The medication in the patch is effective for 72 hours, after which it should be removed.  Wrap patch in a tissue and discard in the trash. Wash hands thoroughly with soap and water. 2. You may remove the patch earlier than 72 hours if you experience unpleasant side effects which may include dry mouth, dizziness or visual disturbances. 3. Avoid touching the patch. Wash your hands with soap and water after contact with the patch.

## 2017-01-20 NOTE — H&P (Signed)
Urology Admission H&P  Chief Complaint: Bilateral renal calculi  History of Present Illness: [name] is a 61yo with a hx of bilateral renal calculi that have been growing in size  Past Medical History:  Diagnosis Date  . Allergic rhinitis   . GERD (gastroesophageal reflux disease)   . H/O bariatric surgery 10/2016  . History of hiatal hernia   . History of kidney stones   . Hypothyroidism   . Migraines   . Mild asthma   . OA (osteoarthritis)   . Renal calculus, bilateral   . Wears glasses    Past Surgical History:  Procedure Laterality Date  . ANKLE ARTHROSCOPY Left 01-27-2002    [facility]  . BREAST SURGERY     lumpectomy x 2 - benign   . CARPAL TUNNEL RELEASE Bilateral right 08-23-2003   dr sypher [facility]/  left 2006 approx.  Marland Kitchen. DILATION AND CURETTAGE OF UTERUS     several   . ESOPHAGOGASTRODUODENOSCOPY  09/2016  . KNEE ARTHROSCOPY WITH MEDIAL MENISECTOMY Right 06/08/2013   Procedure: RIGHT KNEE ARTHROSCOPY WITH LATERAL MENISECTOMY with ABRASION CHRONDROPLASTY and SUPRAPATELLAR SYNOVECTOMY;  Surgeon: Jacki Conesonald A Gioffre, MD;  Location: [facility];  Service: Orthopedics;  Laterality: Right;  . LAPAROSCOPIC CHOLECYSTECTOMY  1996  . LAPAROSCOPIC GASTRIC RESTRICTIVE DUODENAL PROCEDURE (DUODENAL SWITCH)  11-11-2016     [facility] [place] , [place]   AND HIATAL HERNIA REPAIR  . TONSILLECTOMY  child  . TOTAL HIP ARTHROPLASTY Left 04-02-2006   dr Darrelyn Hillockgioffre   [facility]  . TOTAL KNEE ARTHROPLASTY Right 01/25/2015   Procedure: TOTAL KNEE ARTHROPLASTY;  Surgeon: Ranee Gosselinonald Gioffre, MD;  Location: [facility];  Service: Orthopedics;  Laterality: Right;    Home Medications:  Current Facility-Administered Medications  Medication Dose Route Frequency Provider Last Rate Last Dose  . ceFAZolin (ANCEF) 3 g in dextrose 5 % 50 mL IVPB  3 g Intravenous 30 min Pre-Op Raizel Wesolowski, Mardene CelestePatrick L, MD      . lactated ringers infusion   Intravenous Continuous Lowella CurbMiller, Warren Ray, MD 50 mL/hr at 01/20/17 1014     Allergies:  Allergies   Allergen Reactions  . Bee Venom Anaphylaxis  . Codeine Itching  . Guaifenesin & Derivatives Anaphylaxis    History reviewed. No pertinent family history. Social History:  reports that  has never smoked. she has never used smokeless tobacco. She reports that she does not drink alcohol or use drugs.  Review of Systems  All other systems reviewed and are negative.   Physical Exam:  Vital signs in last 24 hours: Temp:  [97.5 F (36.4 C)] 97.5 F (36.4 C) (12/31 0933) Pulse Rate:  [50] 50 (12/31 0933) Resp:  [16] 16 (12/31 0933) BP: (128)/(79) 128/79 (12/31 0933) SpO2:  [100 %] 100 % (12/31 0933) Weight:  [123.6 kg (272 lb 6.4 oz)] 123.6 kg (272 lb 6.4 oz) (12/31 0933) Physical Exam  Constitutional: She is oriented to person, place, and time. She appears well-developed and well-nourished.  HENT:  Head: Normocephalic and atraumatic.  Eyes: EOM are normal. Pupils are equal, round, and reactive to light.  Neck: Normal range of motion. No thyromegaly present.  Cardiovascular: Normal rate and regular rhythm.  Respiratory: Effort normal. No respiratory distress.  GI: Soft. She exhibits no distension.  Musculoskeletal: Normal range of motion. She exhibits no edema.  Neurological: She is alert and oriented to person, place, and time.  Skin: Skin is warm and dry.  Psychiatric: She has a normal mood and affect. Her  behavior is normal. Judgment and thought content normal.    Laboratory Data:  Results for orders placed or performed during the hospital encounter of 01/20/17 (from the past 24 hour(s))  I-STAT, chem 8     Status: Abnormal   Collection Time: 01/20/17 10:14 AM  Result Value Ref Range   Sodium 145 135 - 145 mmol/L   Potassium 3.7 3.5 - 5.1 mmol/L   Chloride 102 101 - 111 mmol/L   BUN 13 6 - 20 mg/dL   Creatinine, Ser 1.610.60 0.44 - 1.00 mg/dL   Glucose, Bld 88 65 - 99 mg/dL   Calcium, Ion 0.961.28 0.451.15 - 1.40 mmol/L   TCO2 31 22 - 32 mmol/L   Hemoglobin 15.3 (H) 12.0 - 15.0  g/dL   HCT 40.945.0 81.136.0 - 91.446.0 %   No results found for this or any previous visit (from the past 240 hour(s)). Creatinine: Recent Labs    01/20/17 1014  CREATININE 0.60   Baseline Creatinine: 0.6  Impression/Assessment:  61yo with bilateral renal calculi  Plan:  The risks/benefits/alternatives to bilateral ureteroscopic stone extraction was explained to the patient and she understands and wishes to proceed with surgery  Wilkie AyePatrick Secilia Apps 01/20/2017, 11:42 AM

## 2017-01-20 NOTE — Op Note (Signed)
Marland Kitchen.Preoperative diagnosis: bilateral renal calculi  Postoperative diagnosis: Same  Procedure: 1 cystoscopy 2. bilateralretrograde pyelography 3.  Intraoperative fluoroscopy, under one hour, with interpretation 4.  Bilateral ureteroscopic stone manipulation with basket extraction and laser lithotripsy 5.  bilateral 6 x 26 JJ stent placement  Attending: Cleda MccreedyPatrick Mackenzie  Anesthesia: General  Estimated blood loss: None  Drains: bilateral 6 x 26 JJ ureteral stent without tether  Specimens: stone for analysis  Antibiotics: ancef  Findings: bilateral upper, mid and lower pole renal calculi. No hydronephrosis. No masses/lesions in the bladder. Ureteral orifices in normal anatomic location.  Indications: Patient is a 61 year old female with a history of bilateral renal calculi which have been growing in size.. After discussing treatment options, they decided proceed with bilateral ureteroscopic stone manipulation.  Procedure her in detail: The patient was brought to the operating room and a brief timeout was done to ensure correct patient, correct procedure, correct site.  General anesthesia was administered patient was placed in dorsal lithotomy position.  Her genitalia was then prepped and draped in usual sterile fashion.  A rigid 22 French cystoscope was passed in the urethra and the bladder.  Bladder was inspected free masses or lesions.  the ureteral orifices were in the normal orthotopic locations. a 6 french ureteral catheter was then instilled into the left ureteral orifice.  a gentle retrograde was obtained and findings noted above. We then advanced a zipwire through the catheter and up to the renal pelvis.  we then removed the cystoscope and cannulated the left ureteral orifice with a semirigid ureteroscope.  We located no stone in the ureter. We then placed a sensor wire up to the renal pelvis. We removed the scope and advanced a 12/14 x 35cm access sheath up to the renal pelvis. We then  used the flexible ureteroscope to perform nephroscopy. We located calculi in the upper, mid and lower poles which were removed with an NGage basket. 2 stones were large and were fragmented with a 200nm laser fiber. The fragments were then removed with an NGage basket. Once the stone were removed we then removed the access sheath under direct vision and noted to injury to the ureter.  We then placed a 6 x 26 double-j ureteral stent over the original zip wire. We then removed the wire and good coil was noted in the the renal pelvis under fluoroscopy and the bladder under direct vision.  We then turned out attention to the right side. a 6 french ureteral catheter was then instilled into the right ureteral orifice.  a gentle retrograde was obtained and findings noted above. We then advanced a zipwire through the catheter and up to the renal pelvis.  we then removed the cystoscope and cannulated the right ureteral orifice with a semirigid ureteroscope.  We located no stone in the ureter. We then placed a sensor wire up to the renal pelvis. We removed the scope and advanced a 12/14 x 38cm access sheath up to the renal pelvis. We then used the flexible ureteroscope to perform nephroscopy. We located calculi int he mid and lower poles which were removed with an NGage basket. 1 stone was large and was fragmented with a 200nm laser fiber. The fragments were then removed with an NGage basket. Once the stone were removed we then removed the access sheath under direct vision and noted to injury to the ureter. we then placed a 6 x 26 double-j ureteral stent over the original zip wire.  We then removed the wire and  good coil was noted in the the renal pelvis under fluoroscopy and the bladder under direct vision.   the bladder was then drained and this concluded the procedure which was well tolerated by patient.  Complications: None  Condition: Stable, extubated, transferred to PACU  Plan: Patient is to be discharged home as  to follow-up in 1 week for stent removal

## 2017-01-20 NOTE — Anesthesia Postprocedure Evaluation (Signed)
Anesthesia Post Note  Patient: Rachel [name]  Procedure(s) Performed: CYSTOSCOPY WITH RETROGRADE PYELOGRAM, URETEROSCOPY AND STENT PLACEMENT (Bilateral ) HOLMIUM LASER APPLICATION (Bilateral )     Patient location during evaluation: PACU Anesthesia Type: General Level of consciousness: awake and alert Pain management: pain level controlled Vital Signs Assessment: post-procedure vital signs reviewed and stable Respiratory status: spontaneous breathing, nonlabored ventilation, respiratory function stable and patient connected to nasal cannula oxygen Cardiovascular status: blood pressure returned to baseline and stable Postop Assessment: no apparent nausea or vomiting Anesthetic complications: no    Last Vitals:  Vitals:   01/20/17 1400 01/20/17 1439  BP: 128/71 114/66  Pulse: (!) 52 62  Resp: 12 16  Temp:  36.4 C  SpO2: 99% 97%    Last Pain:  Vitals:   01/20/17 1439  TempSrc: Oral  PainSc: 2                  Tresten Pantoja DAVID

## 2017-01-20 NOTE — Anesthesia Procedure Notes (Signed)
Procedure Name: LMA Insertion Date/Time: 01/20/2017 12:52 PM Performed by: Arta Brucessey, Kevin, MD Pre-anesthesia Checklist: Patient identified, Emergency Drugs available, Suction available and Patient being monitored Patient Re-evaluated:Patient Re-evaluated prior to induction Oxygen Delivery Method: Circle system utilized Preoxygenation: Pre-oxygenation with 100% oxygen Induction Type: IV induction Ventilation: Mask ventilation without difficulty LMA: LMA inserted LMA Size: 4.0 Number of attempts: 1 Airway Equipment and Method: Bite block Placement Confirmation: positive ETCO2 Tube secured with: Tape Dental Injury: Teeth and Oropharynx as per pre-operative assessment

## 2017-01-20 NOTE — Anesthesia Preprocedure Evaluation (Signed)
Anesthesia Evaluation  Patient identified by MRN, date of birth, ID band Patient awake    Reviewed: Allergy & Precautions, NPO status , Patient's Chart, lab work & pertinent test results  Airway Mallampati: II  TM Distance: >3 FB Neck ROM: Full    Dental   Pulmonary    Pulmonary exam normal        Cardiovascular Normal cardiovascular exam     Neuro/Psych    GI/Hepatic GERD  Medicated and Controlled,  Endo/Other    Renal/GU      Musculoskeletal   Abdominal   Peds  Hematology   Anesthesia Other Findings   Reproductive/Obstetrics                             Anesthesia Physical Anesthesia Plan  ASA: III  Anesthesia Plan: General   Post-op Pain Management:    Induction: Intravenous  PONV Risk Score and Plan: 3 and Ondansetron, Dexamethasone and Midazolam  Airway Management Planned: LMA  Additional Equipment:   Intra-op Plan:   Post-operative Plan: Extubation in OR  Informed Consent: I have reviewed the patients History and Physical, chart, labs and discussed the procedure including the risks, benefits and alternatives for the proposed anesthesia with the patient or authorized representative who has indicated his/her understanding and acceptance.     Plan Discussed with: CRNA and Surgeon  Anesthesia Plan Comments:         Anesthesia Quick Evaluation

## 2017-01-22 ENCOUNTER — Encounter (HOSPITAL_BASED_OUTPATIENT_CLINIC_OR_DEPARTMENT_OTHER): Payer: Self-pay | Admitting: Urology

## 2017-03-12 ENCOUNTER — Encounter: Payer: Self-pay | Admitting: *Deleted

## 2017-03-12 NOTE — Progress Notes (Signed)
Maintenance vial made. Exp: 03-12-18. hv

## 2017-03-14 ENCOUNTER — Encounter: Payer: Self-pay | Admitting: *Deleted

## 2017-03-14 ENCOUNTER — Ambulatory Visit: Payer: Self-pay | Admitting: *Deleted

## 2017-03-14 DIAGNOSIS — J3089 Other allergic rhinitis: Secondary | ICD-10-CM | POA: Diagnosis not present

## 2017-03-21 ENCOUNTER — Ambulatory Visit (INDEPENDENT_AMBULATORY_CARE_PROVIDER_SITE_OTHER): Payer: BC Managed Care – PPO | Admitting: *Deleted

## 2017-03-21 DIAGNOSIS — J309 Allergic rhinitis, unspecified: Secondary | ICD-10-CM

## 2017-03-21 MED ORDER — EPINEPHRINE 0.3 MG/0.3ML IJ SOAJ: 0.3000 mg | Freq: Once | INTRAMUSCULAR | 0 refills | Status: AC

## 2017-03-21 NOTE — Progress Notes (Signed)
Immunotherapy   Patient Details  Name: Rachel [name] MRN: [id number] Date of Birth: 1955/12/09  03/21/2017  Rachel [name] here to pick up  Red vial 1:100 <Molds> Following schedule: C  Frequency:1 time per week at 0.50 every 4 weeks.  Epi-Pen:Epi-Pen Available  Consent signed and patient instructions given. Patient receives injections from her sister who is an Charity fundraiserN. No problems after 30 minutes in office.    Mariane DuvalHeather L Vernon 03/21/2017, 4:01 PM

## 2017-05-13 DIAGNOSIS — K5900 Constipation, unspecified: Secondary | ICD-10-CM | POA: Insufficient documentation

## 2017-08-30 ENCOUNTER — Other Ambulatory Visit: Payer: Self-pay | Admitting: Orthopedic Surgery

## 2017-08-30 DIAGNOSIS — M545 Low back pain: Secondary | ICD-10-CM

## 2017-09-02 DIAGNOSIS — N200 Calculus of kidney: Secondary | ICD-10-CM | POA: Diagnosis not present

## 2017-10-01 ENCOUNTER — Encounter: Payer: Self-pay | Admitting: *Deleted

## 2017-10-01 NOTE — Progress Notes (Signed)
Vials made. Exp: 10-02-18. hv 

## 2017-10-02 DIAGNOSIS — M7062 Trochanteric bursitis, left hip: Secondary | ICD-10-CM | POA: Diagnosis not present

## 2017-10-02 DIAGNOSIS — M545 Low back pain: Secondary | ICD-10-CM | POA: Diagnosis not present

## 2017-10-02 DIAGNOSIS — J3089 Other allergic rhinitis: Secondary | ICD-10-CM | POA: Diagnosis not present

## 2017-10-06 ENCOUNTER — Other Ambulatory Visit: Payer: Self-pay | Admitting: Orthopedic Surgery

## 2017-10-06 DIAGNOSIS — M545 Low back pain: Secondary | ICD-10-CM

## 2017-10-06 DIAGNOSIS — N949 Unspecified condition associated with female genital organs and menstrual cycle: Secondary | ICD-10-CM

## 2017-10-07 ENCOUNTER — Ambulatory Visit
Admission: RE | Admit: 2017-10-07 | Discharge: 2017-10-07 | Disposition: A | Discharge status: Home / Self Care | Payer: BC Managed Care – PPO | Source: Ambulatory Visit | Attending: Orthopedic Surgery | Admitting: Orthopedic Surgery

## 2017-10-07 DIAGNOSIS — M545 Low back pain: Secondary | ICD-10-CM

## 2017-10-07 DIAGNOSIS — N949 Unspecified condition associated with female genital organs and menstrual cycle: Secondary | ICD-10-CM

## 2017-10-07 DIAGNOSIS — N838 Other noninflammatory disorders of ovary, fallopian tube and broad ligament: Secondary | ICD-10-CM | POA: Diagnosis not present

## 2017-10-13 ENCOUNTER — Ambulatory Visit (INDEPENDENT_AMBULATORY_CARE_PROVIDER_SITE_OTHER): Payer: Medicare HMO | Admitting: Allergy and Immunology

## 2017-10-13 ENCOUNTER — Encounter: Payer: Self-pay | Admitting: Allergy and Immunology

## 2017-10-13 ENCOUNTER — Ambulatory Visit: Payer: BC Managed Care – PPO

## 2017-10-13 DIAGNOSIS — Z91038 Other insect allergy status: Secondary | ICD-10-CM | POA: Insufficient documentation

## 2017-10-13 DIAGNOSIS — B359 Dermatophytosis, unspecified: Secondary | ICD-10-CM | POA: Insufficient documentation

## 2017-10-13 DIAGNOSIS — Z87898 Personal history of other specified conditions: Secondary | ICD-10-CM | POA: Diagnosis not present

## 2017-10-13 DIAGNOSIS — J3089 Other allergic rhinitis: Secondary | ICD-10-CM | POA: Diagnosis not present

## 2017-10-13 MED ORDER — ALBUTEROL SULFATE HFA 108 (90 BASE) MCG/ACT IN AERS: 1.0000 | INHALATION_SPRAY | Freq: Four times a day (QID) | RESPIRATORY_TRACT | 2 refills | Status: DC | PRN

## 2017-10-13 NOTE — Patient Instructions (Addendum)
Allergic rhinitis  Continue avoidance measures and antihistamines as needed.  To avoid diminishing benefit with daily use (tachyphylaxis) of second generation antihistamine, consider alternating every few months between fexofenadine (Allegra) and loratadine (Claritin).  Discontinue immunotherapy injections.  Allergy to insect bites and stings  Continue insect avoidance and have access to epinephrine autoinjector in case of sting followed by systemic symptoms.  Tinea  Continue topical antifungal.  If this problem persists or progresses, see dermatologist.  History of wheezing  Continue albuterol HFA, 1 to 2 inhalations every 4-6 hours if needed.   Return in about 1 year (around 10/14/2018), or if symptoms worsen or fail to improve.

## 2017-10-13 NOTE — Assessment & Plan Note (Signed)
   Continue avoidance measures and antihistamines as needed.  To avoid diminishing benefit with daily use (tachyphylaxis) of second generation antihistamine, consider alternating every few months between fexofenadine (Allegra) and loratadine (Claritin).  Discontinue immunotherapy injections.

## 2017-10-13 NOTE — Assessment & Plan Note (Signed)
   Continue topical antifungal.  If this problem persists or progresses, see dermatologist.

## 2017-10-13 NOTE — Assessment & Plan Note (Signed)
   Continue albuterol HFA, 1 to 2 inhalations every 4-6 hours if needed. 

## 2017-10-13 NOTE — Progress Notes (Signed)
Follow-up Note  RE: Rachel [name] MRN: [id number] DOB: [birth year] Date of Office Visit: 10/13/2017  Primary care provider: Sigmund Hazel, MD Referring provider: Sigmund Hazel, MD  History of present illness: Rachel [name] is a 62 y.o. female with allergic rhinitis on immunotherapy and history of mild intermittent asthma presented today for follow-up.  She has been on immunotherapy injections for 15 to 20 years.  She takes fexofenadine daily.  She has no nasal allergy symptom complaints today.  She has a history of insect allergy and carries epinephrine autoinjectors and does her best to avoid flying insects.  She is taking topical antifungals with benefit for what she believes to be a ringworm on her right ankle.  Assessment and plan: Allergic rhinitis  Continue avoidance measures and antihistamines as needed.  To avoid diminishing benefit with daily use (tachyphylaxis) of second generation antihistamine, consider alternating every few months between fexofenadine (Allegra) and loratadine (Claritin).  Discontinue immunotherapy injections.  Allergy to insect bites and stings  Continue insect avoidance and have access to epinephrine autoinjector in case of sting followed by systemic symptoms.  Tinea  Continue topical antifungal.  If this problem persists or progresses, see dermatologist.  History of wheezing  Continue albuterol HFA, 1 to 2 inhalations every 4-6 hours if needed.   Physical examination: Blood pressure 130/70, pulse 68, temperature 97.6 F (36.4 C), temperature source Oral, height 5' 5.5" (1.664 m), weight 231 lb 6.4 oz (105 kg), last menstrual period 06/08/2009, SpO2 96 %.  General: Alert, interactive, in no acute distress. HEENT: TMs pearly gray, turbinates mildly edematous without discharge, post-pharynx mildly erythematous. Neck: Supple without lymphadenopathy. Lungs: Clear to auscultation without wheezing, rhonchi or rales. CV: Normal S1, S2 without  murmurs. Skin: Mildly erythematous circular rash slightly raised at the borders on the lateral right ankle.  The following portions of the patient's history were reviewed and updated as appropriate: allergies, current medications, past family history, past medical history, past social history, past surgical history and problem list.  Allergies as of 10/13/2017      Reactions   Bee Venom Anaphylaxis   Codeine Itching   Guaifenesin & Derivatives Anaphylaxis      Medication List        Accurate as of 10/13/17 12:43 PM. Always use your most recent med list.          acetaminophen 500 MG tablet Commonly known as:  TYLENOL Take 1,000 mg by mouth every 6 (six) hours as needed for mild pain.   albuterol 108 (90 Base) MCG/ACT inhaler Commonly known as:  PROVENTIL HFA;VENTOLIN HFA Inhale 1 puff into the lungs every 6 (six) hours as needed for wheezing or shortness of breath.   CELEBRATE CALCIUM CITRATE 500-500 MG-UNIT chewable tablet Generic drug:  calcium citrate-vitamin D Chew 2 tablets by mouth daily.   EPIPEN 2-PAK 0.3 mg/0.3 mL Soaj injection Generic drug:  EPINEPHrine EpiPen 2-Pak 0.3 mg/0.3 mL injection, auto-injector   fexofenadine 180 MG tablet Commonly known as:  ALLEGRA Take 180 mg by mouth daily.   fluticasone 50 MCG/ACT nasal spray Commonly known as:  FLONASE Place 2 sprays into both nostrils at bedtime.   levothyroxine 137 MCG tablet Commonly known as:  SYNTHROID, LEVOTHROID Take 137 mcg by mouth daily before breakfast.   multivitamin tablet Take 1 tablet by mouth daily. Celebrity nuitriional supplement multiple vitaman complete chews   NON FORMULARY every 30 (thirty) days. Allergy shot Last one 12/21/2016   rOPINIRole 3 MG tablet Commonly known  as:  REQUIP Take 3 mg by mouth at bedtime.       Allergies  Allergen Reactions  . Bee Venom Anaphylaxis  . Codeine Itching  . Guaifenesin & Derivatives Anaphylaxis   Review of systems: Review of systems  negative except as noted in HPI / PMHx or noted below: Constitutional: Negative.  HENT: Negative.   Eyes: Negative.  Respiratory: Negative.   Cardiovascular: Negative.  Gastrointestinal: Negative.  Genitourinary: Negative.  Musculoskeletal: Negative.  Neurological: Negative.  Endo/Heme/Allergies: Negative.  Cutaneous: Negative.  Past Medical History:  Diagnosis Date  . Allergic rhinitis   . GERD (gastroesophageal reflux disease)   . H/O bariatric surgery 10/2016  . History of hiatal hernia   . History of kidney stones   . Hypothyroidism   . Migraines   . Mild asthma   . OA (osteoarthritis)   . Recurrent upper respiratory infection (URI)   . Renal calculus, bilateral   . Wears glasses     Family History  Problem Relation Age of Onset  . Asthma Mother   . Asthma Sister   . Urticaria Neg Hx   . Eczema Neg Hx   . Allergic rhinitis Neg Hx     Social History   Socioeconomic History  . Marital status: Married    Spouse name: Not on file  . Number of children: Not on file  . Years of education: Not on file  . Highest education level: Not on file  Occupational History  . Not on file  Social Needs  . Financial resource strain: Not on file  . Food insecurity:    Worry: Not on file    Inability: Not on file  . Transportation needs:    Medical: Not on file    Non-medical: Not on file  Tobacco Use  . Smoking status: Never Smoker  . Smokeless tobacco: Never Used  Substance and Sexual Activity  . Alcohol use: No    Frequency: Never  . Drug use: No  . Sexual activity: Not on file  Lifestyle  . Physical activity:    Days per week: Not on file    Minutes per session: Not on file  . Stress: Not on file  Relationships  . Social connections:    Talks on phone: Not on file    Gets together: Not on file    Attends religious service: Not on file    Active member of club or organization: Not on file    Attends meetings of clubs or organizations: Not on file     Relationship status: Not on file  . Intimate partner violence:    Fear of current or ex partner: Not on file    Emotionally abused: Not on file    Physically abused: Not on file    Forced sexual activity: Not on file  Other Topics Concern  . Not on file  Social History Narrative  . Not on file   I appreciate the opportunity to take part in Dashanti's care. Please do not hesitate to contact me with questions.  Sincerely,   R. Jorene Guestarter Otilio Groleau, MD

## 2017-10-13 NOTE — Assessment & Plan Note (Signed)
   Continue insect avoidance and have access to epinephrine autoinjector in case of sting followed by systemic symptoms.

## 2017-10-14 DIAGNOSIS — M545 Low back pain: Secondary | ICD-10-CM | POA: Diagnosis not present

## 2017-10-14 DIAGNOSIS — M7062 Trochanteric bursitis, left hip: Secondary | ICD-10-CM | POA: Diagnosis not present

## 2017-10-28 DIAGNOSIS — Z23 Encounter for immunization: Secondary | ICD-10-CM | POA: Diagnosis not present

## 2017-10-28 DIAGNOSIS — E6609 Other obesity due to excess calories: Secondary | ICD-10-CM | POA: Diagnosis not present

## 2017-10-28 DIAGNOSIS — Z6837 Body mass index (BMI) 37.0-37.9, adult: Secondary | ICD-10-CM | POA: Diagnosis not present

## 2017-10-28 DIAGNOSIS — J301 Allergic rhinitis due to pollen: Secondary | ICD-10-CM | POA: Diagnosis not present

## 2017-10-28 DIAGNOSIS — G2581 Restless legs syndrome: Secondary | ICD-10-CM | POA: Diagnosis not present

## 2017-10-28 DIAGNOSIS — E039 Hypothyroidism, unspecified: Secondary | ICD-10-CM | POA: Diagnosis not present

## 2017-10-28 DIAGNOSIS — E611 Iron deficiency: Secondary | ICD-10-CM | POA: Diagnosis not present

## 2017-11-11 DIAGNOSIS — Z23 Encounter for immunization: Secondary | ICD-10-CM | POA: Diagnosis not present

## 2017-11-12 DIAGNOSIS — Z9884 Bariatric surgery status: Secondary | ICD-10-CM | POA: Diagnosis not present

## 2017-11-12 DIAGNOSIS — Z6836 Body mass index (BMI) 36.0-36.9, adult: Secondary | ICD-10-CM | POA: Diagnosis not present

## 2017-11-14 DIAGNOSIS — E039 Hypothyroidism, unspecified: Secondary | ICD-10-CM | POA: Diagnosis not present

## 2017-11-14 DIAGNOSIS — Z9884 Bariatric surgery status: Secondary | ICD-10-CM | POA: Diagnosis not present

## 2017-11-14 DIAGNOSIS — N189 Chronic kidney disease, unspecified: Secondary | ICD-10-CM | POA: Diagnosis not present

## 2017-11-14 DIAGNOSIS — Z6836 Body mass index (BMI) 36.0-36.9, adult: Secondary | ICD-10-CM | POA: Diagnosis not present

## 2017-11-14 DIAGNOSIS — K219 Gastro-esophageal reflux disease without esophagitis: Secondary | ICD-10-CM | POA: Diagnosis not present

## 2017-12-05 DIAGNOSIS — N200 Calculus of kidney: Secondary | ICD-10-CM | POA: Diagnosis not present

## 2017-12-16 DIAGNOSIS — Z87892 Personal history of anaphylaxis: Secondary | ICD-10-CM | POA: Diagnosis not present

## 2017-12-16 DIAGNOSIS — G2581 Restless legs syndrome: Secondary | ICD-10-CM | POA: Diagnosis not present

## 2017-12-16 DIAGNOSIS — Z8249 Family history of ischemic heart disease and other diseases of the circulatory system: Secondary | ICD-10-CM | POA: Diagnosis not present

## 2017-12-16 DIAGNOSIS — E039 Hypothyroidism, unspecified: Secondary | ICD-10-CM | POA: Diagnosis not present

## 2017-12-16 DIAGNOSIS — J309 Allergic rhinitis, unspecified: Secondary | ICD-10-CM | POA: Diagnosis not present

## 2017-12-16 DIAGNOSIS — E669 Obesity, unspecified: Secondary | ICD-10-CM | POA: Diagnosis not present

## 2017-12-16 DIAGNOSIS — G4700 Insomnia, unspecified: Secondary | ICD-10-CM | POA: Diagnosis not present

## 2017-12-16 DIAGNOSIS — Z809 Family history of malignant neoplasm, unspecified: Secondary | ICD-10-CM | POA: Diagnosis not present

## 2017-12-16 DIAGNOSIS — Z833 Family history of diabetes mellitus: Secondary | ICD-10-CM | POA: Diagnosis not present

## 2017-12-16 DIAGNOSIS — Z825 Family history of asthma and other chronic lower respiratory diseases: Secondary | ICD-10-CM | POA: Diagnosis not present

## 2017-12-26 DIAGNOSIS — Z01419 Encounter for gynecological examination (general) (routine) without abnormal findings: Secondary | ICD-10-CM | POA: Diagnosis not present

## 2017-12-26 DIAGNOSIS — N83202 Unspecified ovarian cyst, left side: Secondary | ICD-10-CM | POA: Diagnosis not present

## 2017-12-26 DIAGNOSIS — Z1231 Encounter for screening mammogram for malignant neoplasm of breast: Secondary | ICD-10-CM | POA: Diagnosis not present

## 2017-12-26 DIAGNOSIS — Z6836 Body mass index (BMI) 36.0-36.9, adult: Secondary | ICD-10-CM | POA: Diagnosis not present

## 2017-12-26 DIAGNOSIS — E039 Hypothyroidism, unspecified: Secondary | ICD-10-CM | POA: Diagnosis not present

## 2018-01-29 DIAGNOSIS — R05 Cough: Secondary | ICD-10-CM | POA: Diagnosis not present

## 2018-01-29 DIAGNOSIS — R69 Illness, unspecified: Secondary | ICD-10-CM | POA: Diagnosis not present

## 2018-01-29 DIAGNOSIS — J019 Acute sinusitis, unspecified: Secondary | ICD-10-CM | POA: Diagnosis not present

## 2018-03-23 DIAGNOSIS — R69 Illness, unspecified: Secondary | ICD-10-CM | POA: Diagnosis not present

## 2018-03-24 DIAGNOSIS — M25512 Pain in left shoulder: Secondary | ICD-10-CM | POA: Diagnosis not present

## 2018-03-24 DIAGNOSIS — M79671 Pain in right foot: Secondary | ICD-10-CM | POA: Insufficient documentation

## 2018-04-29 DIAGNOSIS — Z6836 Body mass index (BMI) 36.0-36.9, adult: Secondary | ICD-10-CM | POA: Diagnosis not present

## 2018-04-29 DIAGNOSIS — Z9884 Bariatric surgery status: Secondary | ICD-10-CM | POA: Diagnosis not present

## 2018-05-04 DIAGNOSIS — J301 Allergic rhinitis due to pollen: Secondary | ICD-10-CM | POA: Diagnosis not present

## 2018-05-04 DIAGNOSIS — Z9884 Bariatric surgery status: Secondary | ICD-10-CM | POA: Diagnosis not present

## 2018-05-04 DIAGNOSIS — E039 Hypothyroidism, unspecified: Secondary | ICD-10-CM | POA: Diagnosis not present

## 2018-05-04 DIAGNOSIS — G2581 Restless legs syndrome: Secondary | ICD-10-CM | POA: Diagnosis not present

## 2018-06-11 DIAGNOSIS — M25512 Pain in left shoulder: Secondary | ICD-10-CM | POA: Diagnosis not present

## 2018-06-25 DIAGNOSIS — R1904 Left lower quadrant abdominal swelling, mass and lump: Secondary | ICD-10-CM | POA: Diagnosis not present

## 2018-07-02 DIAGNOSIS — G2581 Restless legs syndrome: Secondary | ICD-10-CM | POA: Diagnosis not present

## 2018-07-02 DIAGNOSIS — K219 Gastro-esophageal reflux disease without esophagitis: Secondary | ICD-10-CM | POA: Diagnosis not present

## 2018-07-02 DIAGNOSIS — G4700 Insomnia, unspecified: Secondary | ICD-10-CM | POA: Diagnosis not present

## 2018-07-02 DIAGNOSIS — E669 Obesity, unspecified: Secondary | ICD-10-CM | POA: Diagnosis not present

## 2018-07-02 DIAGNOSIS — Z79899 Other long term (current) drug therapy: Secondary | ICD-10-CM | POA: Diagnosis not present

## 2018-07-02 DIAGNOSIS — J309 Allergic rhinitis, unspecified: Secondary | ICD-10-CM | POA: Diagnosis not present

## 2018-07-02 DIAGNOSIS — E039 Hypothyroidism, unspecified: Secondary | ICD-10-CM | POA: Diagnosis not present

## 2018-07-02 DIAGNOSIS — Z8249 Family history of ischemic heart disease and other diseases of the circulatory system: Secondary | ICD-10-CM | POA: Diagnosis not present

## 2018-07-02 DIAGNOSIS — K5900 Constipation, unspecified: Secondary | ICD-10-CM | POA: Diagnosis not present

## 2018-07-02 DIAGNOSIS — R32 Unspecified urinary incontinence: Secondary | ICD-10-CM | POA: Diagnosis not present

## 2018-08-11 DIAGNOSIS — N200 Calculus of kidney: Secondary | ICD-10-CM | POA: Diagnosis not present

## 2018-09-01 DIAGNOSIS — H5213 Myopia, bilateral: Secondary | ICD-10-CM | POA: Diagnosis not present

## 2018-09-24 DIAGNOSIS — H52223 Regular astigmatism, bilateral: Secondary | ICD-10-CM | POA: Diagnosis not present

## 2018-09-24 DIAGNOSIS — H524 Presbyopia: Secondary | ICD-10-CM | POA: Diagnosis not present

## 2018-11-13 DIAGNOSIS — L2489 Irritant contact dermatitis due to other agents: Secondary | ICD-10-CM | POA: Diagnosis not present

## 2018-11-13 DIAGNOSIS — Z9884 Bariatric surgery status: Secondary | ICD-10-CM | POA: Diagnosis not present

## 2018-11-13 DIAGNOSIS — G2581 Restless legs syndrome: Secondary | ICD-10-CM | POA: Diagnosis not present

## 2018-11-13 DIAGNOSIS — T63441D Toxic effect of venom of bees, accidental (unintentional), subsequent encounter: Secondary | ICD-10-CM | POA: Diagnosis not present

## 2018-11-13 DIAGNOSIS — Z6837 Body mass index (BMI) 37.0-37.9, adult: Secondary | ICD-10-CM | POA: Diagnosis not present

## 2018-11-13 DIAGNOSIS — J301 Allergic rhinitis due to pollen: Secondary | ICD-10-CM | POA: Diagnosis not present

## 2018-11-13 DIAGNOSIS — E039 Hypothyroidism, unspecified: Secondary | ICD-10-CM | POA: Diagnosis not present

## 2018-11-13 DIAGNOSIS — E6609 Other obesity due to excess calories: Secondary | ICD-10-CM | POA: Diagnosis not present

## 2019-03-01 DIAGNOSIS — J45909 Unspecified asthma, uncomplicated: Secondary | ICD-10-CM | POA: Insufficient documentation

## 2019-03-01 DIAGNOSIS — G2581 Restless legs syndrome: Secondary | ICD-10-CM | POA: Insufficient documentation

## 2019-04-24 ENCOUNTER — Other Ambulatory Visit: Payer: Self-pay

## 2019-04-24 ENCOUNTER — Emergency Department (HOSPITAL_BASED_OUTPATIENT_CLINIC_OR_DEPARTMENT_OTHER)
Admission: EM | Admit: 2019-04-24 | Discharge: 2019-04-24 | Disposition: A | Discharge status: Home / Self Care | Payer: Medicare HMO | Attending: Emergency Medicine | Admitting: Emergency Medicine

## 2019-04-24 ENCOUNTER — Encounter (HOSPITAL_BASED_OUTPATIENT_CLINIC_OR_DEPARTMENT_OTHER): Payer: Self-pay

## 2019-04-24 ENCOUNTER — Emergency Department (HOSPITAL_BASED_OUTPATIENT_CLINIC_OR_DEPARTMENT_OTHER): Payer: Medicare HMO

## 2019-04-24 DIAGNOSIS — M5441 Lumbago with sciatica, right side: Secondary | ICD-10-CM | POA: Insufficient documentation

## 2019-04-24 DIAGNOSIS — E039 Hypothyroidism, unspecified: Secondary | ICD-10-CM | POA: Insufficient documentation

## 2019-04-24 DIAGNOSIS — M5442 Lumbago with sciatica, left side: Secondary | ICD-10-CM | POA: Insufficient documentation

## 2019-04-24 DIAGNOSIS — Z87442 Personal history of urinary calculi: Secondary | ICD-10-CM | POA: Insufficient documentation

## 2019-04-24 DIAGNOSIS — J45909 Unspecified asthma, uncomplicated: Secondary | ICD-10-CM | POA: Diagnosis not present

## 2019-04-24 DIAGNOSIS — M545 Low back pain: Secondary | ICD-10-CM | POA: Diagnosis present

## 2019-04-24 DIAGNOSIS — Z79899 Other long term (current) drug therapy: Secondary | ICD-10-CM | POA: Insufficient documentation

## 2019-04-24 LAB — URINALYSIS, ROUTINE W REFLEX MICROSCOPIC
Bilirubin Urine: NEGATIVE
Glucose, UA: NEGATIVE mg/dL
Hgb urine dipstick: NEGATIVE
Ketones, ur: NEGATIVE mg/dL
Leukocytes,Ua: NEGATIVE
Nitrite: NEGATIVE
Protein, ur: NEGATIVE mg/dL
Specific Gravity, Urine: 1.015 (ref 1.005–1.030)
pH: 6 (ref 5.0–8.0)

## 2019-04-24 MED ORDER — HYDROCODONE-ACETAMINOPHEN 5-325 MG PO TABS
1.0000 | ORAL_TABLET | Freq: Once | ORAL | Status: AC
  Administered 2019-04-24: 1 via ORAL
  Filled 2019-04-24: qty 1

## 2019-04-24 MED ORDER — METHOCARBAMOL 500 MG PO TABS: 500.0000 mg | ORAL_TABLET | Freq: Two times a day (BID) | ORAL | 0 refills | Status: DC

## 2019-04-24 MED ORDER — HYDROCODONE-ACETAMINOPHEN 5-325 MG PO TABS: 1.0000 | ORAL_TABLET | Freq: Four times a day (QID) | ORAL | 0 refills | Status: DC | PRN

## 2019-04-24 MED ORDER — IBUPROFEN 400 MG PO TABS
600.0000 mg | ORAL_TABLET | Freq: Once | ORAL | Status: AC
  Administered 2019-04-24: 600 mg via ORAL
  Filled 2019-04-24: qty 1

## 2019-04-24 MED ORDER — LIDOCAINE 5 % EX PTCH: 1.0000 | MEDICATED_PATCH | CUTANEOUS | 0 refills | Status: DC

## 2019-04-24 NOTE — ED Provider Notes (Signed)
[facility] EMERGENCY DEPARTMENT Provider Note   CSN: [id number] Arrival date & time: 04/24/19  1244     History Chief Complaint  Patient presents with  . Back Pain    Rachel [name] is a 64 y.o. female.  HPI      Rachel [name] is a 64 y.o. female, with a history of GERD, obesity, asthma, presenting to the ED with back pain beginning Wednesday, March 31.  Pain is mostly lower back, central and bilateral, described as a tightness and aching, 8/10, radiating down her legs bilaterally.  She also endorses tingling in the legs bilaterally as well as a feeling of weakness in her legs. she states she does have intermittent history of sciatica type symptoms radiating down one leg or the other.   She states the day before her pain began, she was using a riding lawnmower throughout the day on a steep hill and with lots of bouncing.  The day after her pain began, she did some outside work with raking gravel.  She states it was a physically demanding week.  She denies fever/chills, chest pain, shortness of breath, abdominal pain, falls/trauma, changes in bowel or bladder function, urinary symptoms, saddle anesthesias, loss of sensation in the extremities, or any other complaints.  She has a previously established relationship with [facility].    Past Medical History:  Diagnosis Date  . Allergic rhinitis   . GERD (gastroesophageal reflux disease)   . H/O bariatric surgery 10/2016  . History of hiatal hernia   . History of kidney stones   . Hypothyroidism   . Migraines   . Mild asthma   . OA (osteoarthritis)   . Recurrent upper respiratory infection (URI)   . Renal calculus, bilateral   . Wears glasses     Patient Active Problem List   Diagnosis Date Noted  . Allergic rhinitis 10/13/2017  . Allergy to insect bites and stings 10/13/2017  . Tinea 10/13/2017  . History of wheezing 10/13/2017  . History of total knee arthroplasty 01/25/2015  . Osteoarthritis of right  knee 06/08/2013  . Lateral meniscus tear, current 06/08/2013    Past Surgical History:  Procedure Laterality Date  . ANKLE ARTHROSCOPY Left 01-27-2002    [facility]  . BREAST SURGERY     lumpectomy x 2 - benign   . CARPAL TUNNEL RELEASE Bilateral right 08-23-2003   dr sypher [facility]/  left 2006 approx.  . CYSTOSCOPY WITH RETROGRADE PYELOGRAM, URETEROSCOPY AND STENT PLACEMENT Bilateral 01/20/2017   Procedure: CYSTOSCOPY WITH RETROGRADE PYELOGRAM, URETEROSCOPY AND STENT PLACEMENT;  Surgeon: Malen GauzeMcKenzie, Patrick L, MD;  [facility] [place]  Service: Urology;  Laterality: Bilateral;  . DILATION AND CURETTAGE OF UTERUS     several   . ESOPHAGOGASTRODUODENOSCOPY  09/2016  . HOLMIUM LASER APPLICATION Bilateral 01/20/2017   Procedure: HOLMIUM LASER APPLICATION;  Surgeon: Malen GauzeMcKenzie, Patrick L, MD;  [facility] [place];  Service: Urology;  Laterality: Bilateral;  . KNEE ARTHROSCOPY WITH MEDIAL MENISECTOMY Right 06/08/2013   Procedure: RIGHT KNEE ARTHROSCOPY WITH LATERAL MENISECTOMY with ABRASION CHRONDROPLASTY and SUPRAPATELLAR SYNOVECTOMY;  Surgeon: Jacki Conesonald A Gioffre, MD;  Location: [facility];  Service: Orthopedics;  Laterality: Right;  . LAPAROSCOPIC CHOLECYSTECTOMY  1996  . LAPAROSCOPIC GASTRIC RESTRICTIVE DUODENAL PROCEDURE (DUODENAL SWITCH)  11-11-2016     [facility] [place] , [place]   AND HIATAL HERNIA REPAIR  . TONSILLECTOMY  child  . TOTAL HIP ARTHROPLASTY Left 04-02-2006   dr Darrelyn Hillockgioffre  [facility]  . TOTAL KNEE ARTHROPLASTY Right 01/25/2015   Procedure: TOTAL KNEE ARTHROPLASTY;  Surgeon: Ranee Gosselin, MD;  Location: [facility];  Service: Orthopedics;  Laterality: Right;     OB History   No obstetric history on file.     Family History  Problem Relation Age of Onset  . Asthma Mother   . Asthma Sister   . Urticaria Neg Hx   . Eczema Neg Hx   . Allergic rhinitis Neg Hx     Social History   Tobacco Use  . Smoking status: Never Smoker  . Smokeless tobacco: Never Used   Substance Use Topics  . Alcohol use: No  . Drug use: No    Home Medications Prior to Admission medications   Medication Sig Start Date End Date Taking? Authorizing Provider  pantoprazole (PROTONIX) 40 MG tablet Take by mouth. 04/01/19 05/01/19 Yes [provider]  acetaminophen (TYLENOL) 500 MG tablet Take 1,000 mg by mouth every 6 (six) hours as needed for mild pain.    [provider]  albuterol (PROVENTIL HFA;VENTOLIN HFA) 108 (90 Base) MCG/ACT inhaler Inhale 1 puff into the lungs every 6 (six) hours as needed for wheezing or shortness of breath. 10/13/17   Bobbitt, Heywood Iles, MD  calcium citrate-vitamin D (CELEBRATE CALCIUM CITRATE) 500-500 MG-UNIT chewable tablet Chew 2 tablets by mouth daily.    [provider]  EPINEPHrine (EPIPEN 2-PAK) 0.3 mg/0.3 mL IJ SOAJ injection EpiPen 2-Pak 0.3 mg/0.3 mL injection, auto-injector    [provider]  ferrous sulfate 325 (65 FE) MG tablet Iron (ferrous sulfate) 325 mg (65 mg iron) tablet   1 tablet every day by oral route.    [provider]  fexofenadine (ALLEGRA) 180 MG tablet Take 180 mg by mouth daily.    [provider]  fluticasone (FLONASE) 50 MCG/ACT nasal spray Place 2 sprays into both nostrils at bedtime.     [provider]  gabapentin (NEURONTIN) 300 MG capsule  12/24/18   [provider]  HYDROcodone-acetaminophen (NORCO/VICODIN) 5-325 MG tablet Take 1-2 tablets by mouth every 6 (six) hours as needed for severe pain. 04/24/19   Kylan Liberati C, PA-C  levothyroxine (SYNTHROID, LEVOTHROID) 137 MCG tablet Take 137 mcg by mouth daily before breakfast.     [provider]  lidocaine (LIDODERM) 5 % Place 1 patch onto the skin daily. Remove & Discard patch within 12 hours or as directed by MD 04/24/19   Briony Parveen, Hillard Danker, PA-C  Melatonin 10 MG TABS Take by mouth.    [provider]  methocarbamol (ROBAXIN) 500 MG tablet Take 1 tablet (500 mg total) by mouth 2 (two)  times daily. 04/24/19   Milinda Sweeney C, PA-C  Multiple Vitamin (MULTIVITAMIN) tablet Take 1 tablet by mouth daily. Celebrity nuitriional supplement multiple vitaman complete chews    [provider]  NON FORMULARY every 30 (thirty) days. Allergy shot Last one 12/21/2016    [provider]    Allergies    Bee venom, Codeine, Guaifenesin & derivatives, Molds & smuts, and Pollen extract  Review of Systems   Review of Systems  Constitutional: Negative for chills, diaphoresis and fever.  Respiratory: Negative for shortness of breath.   Cardiovascular: Negative for chest pain.  Gastrointestinal: Negative for abdominal pain, diarrhea, nausea and vomiting.  Genitourinary: Negative for difficulty urinating, dysuria, frequency and hematuria.  Musculoskeletal: Positive for back pain. Negative for neck pain.  Neurological: Positive for weakness. Negative for dizziness, syncope and numbness.  All other systems reviewed and are negative.   Physical Exam Updated Vital Signs BP 131/87 (BP Location: Right Arm)   Pulse 90   Temp 97.6 F (36.4 C) (Oral)   Resp 18   Ht 5\' 6"  (1.676 m)   Wt 108.4 kg   LMP 06/08/2009   SpO2 100%   BMI 38.58 kg/m   Physical Exam Vitals and nursing note reviewed.  Constitutional:      General: She is not in acute distress.    Appearance: She is well-developed. She is not diaphoretic.  HENT:     Head: Normocephalic and atraumatic.     Mouth/Throat:     Mouth: Mucous membranes are moist.     Pharynx: Oropharynx is clear.  Eyes:     Conjunctiva/sclera: Conjunctivae normal.  Cardiovascular:     Rate and Rhythm: Normal rate and regular rhythm.     Pulses: Normal pulses.          Radial pulses are 2+ on the right side and 2+ on the left side.       Posterior tibial pulses are 2+ on the right side and 2+ on the left side.     Heart sounds: Normal heart sounds.     Comments: Tactile temperature in the extremities appropriate and equal  bilaterally. Pulmonary:     Effort: Pulmonary effort is normal. No respiratory distress.     Breath sounds: Normal breath sounds.  Abdominal:     Palpations: Abdomen is soft.     Tenderness: There is no abdominal tenderness. There is no guarding.  Musculoskeletal:     Cervical back: Neck supple. No tenderness or bony tenderness.       Back:     Right lower leg: No edema.     Left lower leg: No edema.     Comments: No tenderness, swelling, or color abnormalities in the lower extremities.  Lymphadenopathy:     Cervical: No cervical adenopathy.  Skin:    General: Skin is warm and dry.  Neurological:     Mental Status: She is alert.     Deep Tendon Reflexes:     Reflex Scores:      Patellar reflexes are 2+ on the right side and 2+ on the left side.    Comments: Sensation grossly intact to light touch in the lower extremities bilaterally. No saddle anesthesias. Strength 4/5 and equal bilaterally in the hips, 5/5 in the bilateral knees, and 5/5 in the bilateral ankles. Patient ambulates with the use of a rolling walker, which she states she needs to use due to her pain. Coordination intact.  Psychiatric:        Mood and Affect: Mood and affect normal.        Speech: Speech normal.        Behavior: Behavior normal.     ED Results / Procedures / Treatments   Labs (all labs ordered are listed, but only abnormal results are displayed) Labs Reviewed  URINALYSIS, ROUTINE W REFLEX MICROSCOPIC    EKG None  Radiology CT Thoracic Spine Wo Contrast  Result Date: 04/24/2019 CLINICAL DATA:  Back pain and leg weakness. EXAM: CT THORACIC AND LUMBAR SPINE WITHOUT CONTRAST TECHNIQUE: Multidetector CT imaging of the thoracic and lumbar spine was performed without contrast. Multiplanar CT image reconstructions were also generated. COMPARISON:  CT scan 09/02/2017 FINDINGS: CT THORACIC SPINE FINDINGS Alignment: Moderate right convex thoracic scoliosis but the vertebral bodies are normally aligned  in the sagittal plane. Exaggerated thoracic  kyphosis is noted. Vertebrae: Moderate age advanced degenerative thoracic spondylosis with multilevel disc disease and facet disease. Partially bridging osteophytes noted in the lower thoracic region. No acute fractures identified. No worrisome bone lesions. Age advanced appearing osteoporosis. Paraspinal and other soft tissues: No significant paraspinal or retroperitoneal findings are identified. There is tortuosity and ectasia of the thoracic aorta and age advanced coronary artery calcifications. The visualized lungs are grossly clear. No worrisome pulmonary lesions. No pleural effusion. Disc levels: The spinal canal is fairly generous. No obvious large thoracic disc protrusions, significant spinal or foraminal stenosis. CT LUMBAR SPINE FINDINGS Segmentation: There are five lumbar type vertebral bodies. The last full intervertebral disc space is labeled L5-S1. Alignment: Normal alignment. Vertebrae: No bone lesions or fractures. There are advanced facet degenerative changes, most notably in the lower lumbar spine at L4-5 and L5-S1 but no pars defects are identified. Paraspinal and other soft tissues: Scattered aortic calcifications but no aneurysm. Bilateral renal calculi noted. Disc levels: Two L1-2: No significant findings. Mild facet disease. L2-3: Moderate facet disease but no disc protrusions, spinal or foraminal stenosis. L3-4: Degenerative disc disease with a bulging degenerated annulus. This in combination with moderate facet disease and ligamentum flavum thickening contributes to mild spinal and bilateral lateral recess stenosis. No significant foraminal stenosis. L4-5: Diffuse bulging degenerated annulus, shallow central disc protrusion and osteophytic spurring in conjunction with severe facet disease and ligamentum flavum thickening contributes to moderate to moderately severe spinal and bilateral lateral recess stenosis. No significant foraminal stenosis.  L5-S1: Severe facet disease but no disc protrusions, spinal or foraminal stenosis. IMPRESSION: CT THORACIC SPINE 1. Moderate age advanced degenerative thoracic spondylosis with multilevel disc disease and facet disease. 2. No obvious large thoracic disc protrusions, significant spinal or foraminal stenosis. 3. Age advanced appearing osteoporosis. 4. No acute thoracic spine fracture. CT LUMBAR SPINE 1. Normal alignment and no acute bony findings. 2. Degenerative lumbar spondylosis with multilevel disc disease and facet disease. 3. Multifactorial mild spinal and bilateral lateral recess stenosis at L3-4. 4. Moderate to moderately severe spinal and bilateral lateral recess stenosis at L4-5. Aortic Atherosclerosis (ICD10-I70.0). Electronically Signed   By: Rudie Meyer M.D.   On: 04/24/2019 15:01   CT Lumbar Spine Wo Contrast  Result Date: 04/24/2019 CLINICAL DATA:  Back pain and leg weakness. EXAM: CT THORACIC AND LUMBAR SPINE WITHOUT CONTRAST TECHNIQUE: Multidetector CT imaging of the thoracic and lumbar spine was performed without contrast. Multiplanar CT image reconstructions were also generated. COMPARISON:  CT scan 09/02/2017 FINDINGS: CT THORACIC SPINE FINDINGS Alignment: Moderate right convex thoracic scoliosis but the vertebral bodies are normally aligned in the sagittal plane. Exaggerated thoracic kyphosis is noted. Vertebrae: Moderate age advanced degenerative thoracic spondylosis with multilevel disc disease and facet disease. Partially bridging osteophytes noted in the lower thoracic region. No acute fractures identified. No worrisome bone lesions. Age advanced appearing osteoporosis. Paraspinal and other soft tissues: No significant paraspinal or retroperitoneal findings are identified. There is tortuosity and ectasia of the thoracic aorta and age advanced coronary artery calcifications. The visualized lungs are grossly clear. No worrisome pulmonary lesions. No pleural effusion. Disc levels: The spinal  canal is fairly generous. No obvious large thoracic disc protrusions, significant spinal or foraminal stenosis. CT LUMBAR SPINE FINDINGS Segmentation: There are five lumbar type vertebral bodies. The last full intervertebral disc space is labeled L5-S1. Alignment: Normal alignment. Vertebrae: No bone lesions or fractures. There are advanced facet degenerative changes, most notably in the lower lumbar spine at L4-5 and L5-S1 but  no pars defects are identified. Paraspinal and other soft tissues: Scattered aortic calcifications but no aneurysm. Bilateral renal calculi noted. Disc levels: Two L1-2: No significant findings. Mild facet disease. L2-3: Moderate facet disease but no disc protrusions, spinal or foraminal stenosis. L3-4: Degenerative disc disease with a bulging degenerated annulus. This in combination with moderate facet disease and ligamentum flavum thickening contributes to mild spinal and bilateral lateral recess stenosis. No significant foraminal stenosis. L4-5: Diffuse bulging degenerated annulus, shallow central disc protrusion and osteophytic spurring in conjunction with severe facet disease and ligamentum flavum thickening contributes to moderate to moderately severe spinal and bilateral lateral recess stenosis. No significant foraminal stenosis. L5-S1: Severe facet disease but no disc protrusions, spinal or foraminal stenosis. IMPRESSION: CT THORACIC SPINE 1. Moderate age advanced degenerative thoracic spondylosis with multilevel disc disease and facet disease. 2. No obvious large thoracic disc protrusions, significant spinal or foraminal stenosis. 3. Age advanced appearing osteoporosis. 4. No acute thoracic spine fracture. CT LUMBAR SPINE 1. Normal alignment and no acute bony findings. 2. Degenerative lumbar spondylosis with multilevel disc disease and facet disease. 3. Multifactorial mild spinal and bilateral lateral recess stenosis at L3-4. 4. Moderate to moderately severe spinal and bilateral  lateral recess stenosis at L4-5. Aortic Atherosclerosis (ICD10-I70.0). Electronically Signed   By: Marijo Sanes M.D.   On: 04/24/2019 15:01    Procedures Procedures (including critical care time)  Medications Ordered in ED Medications  HYDROcodone-acetaminophen (NORCO/VICODIN) 5-325 MG per tablet 1 tablet (1 tablet Oral Given 04/24/19 1350)  ibuprofen (ADVIL) tablet 600 mg (600 mg Oral Given 04/24/19 1350)  HYDROcodone-acetaminophen (NORCO/VICODIN) 5-325 MG per tablet 1 tablet (1 tablet Oral Given 04/24/19 1511)    ED Course  I have reviewed the triage vital signs and the nursing notes.  Pertinent labs & imaging results that were available during my care of the patient were reviewed by me and considered in my medical decision making (see chart for details).  Clinical Course as of Apr 23 1553  Sat Apr 24, 2019  1500 Patient states her pain has improved to 6/10.   [SJ]  0272 Patient states her pain has significantly improved.  Strength in her legs is now 5/5.  She is able to get up out of bed and walk without any assistance.   [SJ]    Clinical Course User Index [SJ] Dasani Crear C, PA-C   MDM Rules/Calculators/A&P                      Patient presents with back pain with tingling extending down the legs bilaterally for several days. Patient is nontoxic appearing, afebrile, not tachycardic, not tachypneic, not hypotensive, maintains excellent SPO2 on room air.   I reviewed and interpreted the patient's labs and radiological studies. There were no acute abnormalities on the patient's spinal CTs or her UA. After pain management, patient was able to ambulate independently. Due to the severe degenerative changes noted on CT as well as her symptoms over the last several days, it was highly recommended that she follow-up with her orthopedic specialist. The patient was given instructions for home care as well as return precautions. Patient voices understanding of these instructions, accepts the  plan, and is comfortable with discharge.  Findings and plan of care discussed with Malvin Johns, MD.    Vitals:   04/24/19 1257 04/24/19 1258 04/24/19 1515  BP: 131/87  111/72  Pulse: 90  64  Resp: 18  14  Temp: 97.6 F (36.4 C)  TempSrc: Oral    SpO2: 100%  98%  Weight:  108.4 kg   Height:  5\' 6"  (1.676 m)      Final Clinical Impression(s) / ED Diagnoses Final diagnoses:  Acute bilateral low back pain with bilateral sciatica    Rx / DC Orders ED Discharge Orders         Ordered    HYDROcodone-acetaminophen (NORCO/VICODIN) 5-325 MG tablet  Every 6 hours PRN     04/24/19 1552    methocarbamol (ROBAXIN) 500 MG tablet  2 times daily     04/24/19 1552    lidocaine (LIDODERM) 5 %  Every 24 hours     04/24/19 1552           06/24/19, PA-C 04/24/19 1558    06/24/19, MD 04/25/19 [phone]

## 2019-04-24 NOTE — ED Notes (Signed)
ED Provider at bedside. 

## 2019-04-24 NOTE — ED Triage Notes (Signed)
Pt arrives to ED using rolling walker, states that she does not usually use this but has had back pain since Wednesday and some weakness in her legs since Thursday. Was seen at the [facility] walk in clinic today and advised to come to ED.

## 2019-04-24 NOTE — Discharge Instructions (Signed)
  Expect your soreness to increase over the next 2-3 days. Take it easy, but do not lay around too much as this may make any stiffness worse.  Antiinflammatory medications: Take 600 mg of ibuprofen every 6 hours or 440 mg (over the counter dose) to 500 mg (prescription dose) of naproxen every 12 hours for the next 3 days. After this time, these medications may be used as needed for pain. Take these medications with food to avoid upset stomach. Choose only one of these medications, do not take them together. Acetaminophen (generic for Tylenol): Should you continue to have additional pain while taking the ibuprofen or naproxen, you may add in acetaminophen as needed. Your daily total maximum amount of acetaminophen from all sources should be limited to 4000mg/day for persons without liver problems, or 2000mg/day for those with liver problems. Vicodin: May take Vicodin (hydrocodone-acetaminophen) as needed for severe pain.   Do not drive or perform other dangerous activities while taking this medication as it can cause drowsiness as well as changes in reaction time and judgement.   Please note that each pill of Vicodin contains 325 mg of acetaminophen (generic for Tylenol) and the above dosage limits apply. Methocarbamol: Methocarbamol (generic for Robaxin) is a muscle relaxer and can help relieve stiff muscles or muscle spasms.  Do not drive or perform other dangerous activities while taking this medication as it can cause drowsiness as well as changes in reaction time and judgement. Lidocaine patches: These are available via either prescription or over-the-counter. The over-the-counter option may be more economical one and are likely just as effective. There are multiple over-the-counter brands, such as Salonpas. Ice: May apply ice to the area over the next 24 hours for 15 minutes at a time to reduce pain, inflammation, and swelling, if present. Exercises: Be sure to perform the attached exercises starting  with three times a week and working up to performing them daily. This is an essential part of preventing long term problems.  Follow up: Follow up with a primary care provider for any future management of these complaints. Be sure to follow up within 7-10 days. Return: Return to the ED should symptoms worsen.  For prescription assistance, may try using prescription discount sites or apps, such as goodrx.com 

## 2019-04-24 NOTE — ED Notes (Signed)
Assisted pt to BR with walker

## 2019-04-26 ENCOUNTER — Telehealth: Payer: Self-pay

## 2019-04-26 ENCOUNTER — Other Ambulatory Visit: Payer: Self-pay

## 2019-04-26 ENCOUNTER — Emergency Department (HOSPITAL_COMMUNITY): Payer: Medicare HMO

## 2019-04-26 ENCOUNTER — Encounter (HOSPITAL_COMMUNITY): Payer: Self-pay | Admitting: *Deleted

## 2019-04-26 ENCOUNTER — Emergency Department (HOSPITAL_COMMUNITY)
Admission: EM | Admit: 2019-04-26 | Discharge: 2019-04-26 | Disposition: A | Discharge status: Home / Self Care | Payer: Medicare HMO | Attending: Emergency Medicine | Admitting: Emergency Medicine

## 2019-04-26 DIAGNOSIS — Y9389 Activity, other specified: Secondary | ICD-10-CM | POA: Insufficient documentation

## 2019-04-26 DIAGNOSIS — M5430 Sciatica, unspecified side: Secondary | ICD-10-CM | POA: Diagnosis not present

## 2019-04-26 DIAGNOSIS — S86911A Strain of unspecified muscle(s) and tendon(s) at lower leg level, right leg, initial encounter: Secondary | ICD-10-CM | POA: Diagnosis not present

## 2019-04-26 DIAGNOSIS — N390 Urinary tract infection, site not specified: Secondary | ICD-10-CM

## 2019-04-26 DIAGNOSIS — Z96651 Presence of right artificial knee joint: Secondary | ICD-10-CM | POA: Insufficient documentation

## 2019-04-26 DIAGNOSIS — E039 Hypothyroidism, unspecified: Secondary | ICD-10-CM | POA: Insufficient documentation

## 2019-04-26 DIAGNOSIS — Y92018 Other place in single-family (private) house as the place of occurrence of the external cause: Secondary | ICD-10-CM | POA: Diagnosis not present

## 2019-04-26 DIAGNOSIS — W19XXXA Unspecified fall, initial encounter: Secondary | ICD-10-CM | POA: Diagnosis not present

## 2019-04-26 DIAGNOSIS — Z79899 Other long term (current) drug therapy: Secondary | ICD-10-CM | POA: Insufficient documentation

## 2019-04-26 DIAGNOSIS — Y998 Other external cause status: Secondary | ICD-10-CM | POA: Insufficient documentation

## 2019-04-26 DIAGNOSIS — R339 Retention of urine, unspecified: Secondary | ICD-10-CM | POA: Diagnosis not present

## 2019-04-26 DIAGNOSIS — Z96642 Presence of left artificial hip joint: Secondary | ICD-10-CM | POA: Insufficient documentation

## 2019-04-26 DIAGNOSIS — S8991XA Unspecified injury of right lower leg, initial encounter: Secondary | ICD-10-CM | POA: Diagnosis present

## 2019-04-26 LAB — CBC WITH DIFFERENTIAL/PLATELET
Abs Immature Granulocytes: 0.02 10*3/uL (ref 0.00–0.07)
Basophils Absolute: 0 10*3/uL (ref 0.0–0.1)
Basophils Relative: 0 %
Eosinophils Absolute: 0 10*3/uL (ref 0.0–0.5)
Eosinophils Relative: 0 %
HCT: 48.7 % — ABNORMAL HIGH (ref 36.0–46.0)
Hemoglobin: 15.4 g/dL — ABNORMAL HIGH (ref 12.0–15.0)
Immature Granulocytes: 0 %
Lymphocytes Relative: 14 %
Lymphs Abs: 1.4 10*3/uL (ref 0.7–4.0)
MCH: 29.2 pg (ref 26.0–34.0)
MCHC: 31.6 g/dL (ref 30.0–36.0)
MCV: 92.4 fL (ref 80.0–100.0)
Monocytes Absolute: 0.6 10*3/uL (ref 0.1–1.0)
Monocytes Relative: 6 %
Neutro Abs: 7.7 10*3/uL (ref 1.7–7.7)
Neutrophils Relative %: 80 %
Platelets: 210 10*3/uL (ref 150–400)
RBC: 5.27 MIL/uL — ABNORMAL HIGH (ref 3.87–5.11)
RDW: 13.2 % (ref 11.5–15.5)
WBC: 9.8 10*3/uL (ref 4.0–10.5)
nRBC: 0 % (ref 0.0–0.2)

## 2019-04-26 LAB — BASIC METABOLIC PANEL
Anion gap: 14 (ref 5–15)
BUN: 17 mg/dL (ref 8–23)
CO2: 25 mmol/L (ref 22–32)
Calcium: 9.9 mg/dL (ref 8.9–10.3)
Chloride: 101 mmol/L (ref 98–111)
Creatinine, Ser: 0.54 mg/dL (ref 0.44–1.00)
GFR calc Af Amer: >60 mL/min (ref >60)
GFR calc non Af Amer: >60 mL/min (ref >60)
Glucose, Bld: 102 mg/dL — ABNORMAL HIGH (ref 70–99)
Potassium: 4 mmol/L (ref 3.5–5.1)
Sodium: 140 mmol/L (ref 135–145)

## 2019-04-26 LAB — URINALYSIS, ROUTINE W REFLEX MICROSCOPIC
Bilirubin Urine: NEGATIVE
Glucose, UA: NEGATIVE mg/dL
Hgb urine dipstick: NEGATIVE
Ketones, ur: NEGATIVE mg/dL
Nitrite: NEGATIVE
Protein, ur: 30 mg/dL — AB
Specific Gravity, Urine: 1.03 (ref 1.005–1.030)
pH: 5 (ref 5.0–8.0)

## 2019-04-26 MED ORDER — OXYCODONE-ACETAMINOPHEN 5-325 MG PO TABS
1.0000 | ORAL_TABLET | Freq: Once | ORAL | Status: AC
  Administered 2019-04-26: 1 via ORAL
  Filled 2019-04-26: qty 1

## 2019-04-26 MED ORDER — FENTANYL CITRATE (PF) 100 MCG/2ML IJ SOLN
50.0000 ug | Freq: Once | INTRAMUSCULAR | Status: AC
  Administered 2019-04-26: 50 ug via INTRAVENOUS
  Filled 2019-04-26: qty 2

## 2019-04-26 MED ORDER — LORAZEPAM 2 MG/ML IJ SOLN
1.0000 mg | INTRAMUSCULAR | Status: AC | PRN
  Administered 2019-04-26: 1 mg via INTRAVENOUS
  Filled 2019-04-26: qty 1

## 2019-04-26 MED ORDER — OXYCODONE-ACETAMINOPHEN 5-325 MG PO TABS: 1.0000 | ORAL_TABLET | ORAL | 0 refills | Status: DC | PRN

## 2019-04-26 MED ORDER — ACETAMINOPHEN 325 MG PO TABS
650.0000 mg | ORAL_TABLET | Freq: Once | ORAL | Status: AC
  Administered 2019-04-26: 650 mg via ORAL
  Filled 2019-04-26: qty 2

## 2019-04-26 MED ORDER — DIAZEPAM 2 MG PO TABS
2.0000 mg | ORAL_TABLET | Freq: Once | ORAL | Status: AC
  Administered 2019-04-26: 2 mg via ORAL
  Filled 2019-04-26: qty 1

## 2019-04-26 MED ORDER — SODIUM CHLORIDE 0.9 % IV SOLN
1.0000 g | Freq: Once | INTRAVENOUS | Status: AC
  Administered 2019-04-26: 1 g via INTRAVENOUS
  Filled 2019-04-26: qty 10

## 2019-04-26 MED ORDER — GADOBUTROL 1 MMOL/ML IV SOLN
10.0000 mL | Freq: Once | INTRAVENOUS | Status: AC | PRN
  Administered 2019-04-26: 10 mL via INTRAVENOUS

## 2019-04-26 MED ORDER — CEPHALEXIN 500 MG PO CAPS: 500.0000 mg | ORAL_CAPSULE | Freq: Four times a day (QID) | ORAL | 0 refills | Status: AC

## 2019-04-26 MED ORDER — SODIUM CHLORIDE 0.9 % IV BOLUS
1000.0000 mL | Freq: Once | INTRAVENOUS | Status: AC
  Administered 2019-04-26: 1000 mL via INTRAVENOUS

## 2019-04-26 NOTE — Progress Notes (Addendum)
Patient was not in waiting room when wheelchair arrived. Called home number, no answer.  If patient calls back, will need to go to purchase wheelchair at  [facility] on [address], if she is still in need of one

## 2019-04-26 NOTE — ED Triage Notes (Signed)
The pt fell in the house earlier tonight  She arrived by [facility] from home  Pt c/o   Rt knee pain  Knee replacement 3 years ago

## 2019-04-26 NOTE — Progress Notes (Signed)
Spoke with patient progressive weakness.  Had to call for husband for 45 minutes this am to go to bathroom ( he is in another bedroom and hard of hearing) as a result patient fell. Has walker and toilet lift at home. Can only walk a few steps. Patient has a ramp at home.  Plan/ Recommendation for services:: Home PT, Wheelchair . Referrals done for both Home PT through [facility] and Wheelchair through [facility]. Will follow up with patient.

## 2019-04-26 NOTE — Progress Notes (Signed)
Rachel [name] went to [facility] for wheelchair as directed in phone message, did not have order there. Called to [place] here. Called [name] to send order to [facility] store

## 2019-04-26 NOTE — ED Notes (Signed)
Pt requires considerable assist to stretcher.  Unable to WB to transfer. Pure wick placed and ice reapplied to R knee.

## 2019-04-26 NOTE — Discharge Instructions (Signed)
Recommend following up with your orthopedic doctor regarding your right knee strain.  Regarding your back pain, recommend following up with neurosurgery.  I would also recommend following up with your primary care doctor, ideally to be seen sometime this week.  Take antibiotics for UTI.  Can take the prescribed pain medicine as needed for severe pain.  If your pain is not well controlled, you have worsening weakness, numbness, bladder or bowel incontinence, fever, return to ER for reassessment.

## 2019-04-26 NOTE — ED Notes (Signed)
Got patient back in bed on the monitor after the bathroom patient  is resting with call bell in reach

## 2019-04-26 NOTE — ED Notes (Signed)
To MRI

## 2019-04-26 NOTE — Progress Notes (Signed)
    Durable Medical Equipment  (From admission, onward)         Start     Ordered   04/26/19 1443  For home use only DME lightweight manual wheelchair with seat cushion  Once    Comments: Patient suffers from weakness which impairs their ability to perform daily activities like walking in the home.  A walker will not resolve  issue with performing activities of daily living. A wheelchair will allow patient to safely perform daily activities. Patient is not able to propel themselves in the home using a standard weight wheelchair due to general weakness. Patient can self propel in the lightweight wheelchair. Length of need 12 months  Accessories: elevating leg rests (ELRs), wheel locks, extensions and anti-tippers.   04/26/19 1446

## 2019-04-26 NOTE — ED Notes (Signed)
Pt up at bedside with MD (A).  Able to WB and take only a few steps with gross (A) needed to get up OOB.

## 2019-04-26 NOTE — ED Provider Notes (Addendum)
[facility] EMERGENCY DEPARTMENT Provider Note   CSN: [id number] Arrival date & time: 04/26/19  4098     History Chief Complaint  Patient presents with  . Fall    Rachel [name] is a 64 y.o. female.  Presented to ER with fall, knee pain.  Patient felt like her legs gave out on her this morning and fell at home landing on her right knee.  Has history of knee replacement.  Pain mild at rest, severe with movement.  Difficulty with bearing weight.  Has not gotten anything for pain medicine yet.  She denies any other associated injuries.  Denies hitting her head, no loss of consciousness.  Since Wednesday patient has been having worsening back pain, bilateral leg numbness, leg weakness.  Numbness is described as tingling sensation, still able to feel.  Also feels like she is having a hard time urinating.  Does not believe she is having urinary retention.  No bowel incontinence.  No saddle anesthesia.  Went to [facility] ER over the weekend, CT T and L-spine reads below.  Discharged home with Lidoderm patch, Vicodin.    CT THORACIC SPINE  1. Moderate age advanced degenerative thoracic spondylosis with multilevel disc disease and facet disease. 2. No obvious large thoracic disc protrusions, significant spinal or foraminal stenosis. 3. Age advanced appearing osteoporosis. 4. No acute thoracic spine fracture.  CT LUMBAR SPINE  1. Normal alignment and no acute bony findings. 2. Degenerative lumbar spondylosis with multilevel disc disease and facet disease. 3. Multifactorial mild spinal and bilateral lateral recess stenosis at L3-4. 4. Moderate to moderately severe spinal and bilateral lateral recess stenosis at L4-5.  HPI     Past Medical History:  Diagnosis Date  . Allergic rhinitis   . GERD (gastroesophageal reflux disease)   . H/O bariatric surgery 10/2016  . History of hiatal hernia   . History of kidney stones   . Hypothyroidism   . Migraines   .  Mild asthma   . OA (osteoarthritis)   . Recurrent upper respiratory infection (URI)   . Renal calculus, bilateral   . Wears glasses     Patient Active Problem List   Diagnosis Date Noted  . Allergic rhinitis 10/13/2017  . Allergy to insect bites and stings 10/13/2017  . Tinea 10/13/2017  . History of wheezing 10/13/2017  . History of total knee arthroplasty 01/25/2015  . Osteoarthritis of right knee 06/08/2013  . Lateral meniscus tear, current 06/08/2013    Past Surgical History:  Procedure Laterality Date  . ANKLE ARTHROSCOPY Left 01-27-2002    dr Elita Quick  [facility]  . BREAST SURGERY     lumpectomy x 2 - benign   . CARPAL TUNNEL RELEASE Bilateral right 08-23-2003   dr sypher [facility]/  left 2006 approx.  . CYSTOSCOPY WITH RETROGRADE PYELOGRAM, URETEROSCOPY AND STENT PLACEMENT Bilateral 01/20/2017   Procedure: CYSTOSCOPY WITH RETROGRADE PYELOGRAM, URETEROSCOPY AND STENT PLACEMENT;  Surgeon: Malen Gauze, MD;  Location: [facility];  Service: Urology;  Laterality: Bilateral;  . DILATION AND CURETTAGE OF UTERUS     several   . ESOPHAGOGASTRODUODENOSCOPY  09/2016  . HOLMIUM LASER APPLICATION Bilateral 01/20/2017   Procedure: HOLMIUM LASER APPLICATION;  Surgeon: Malen Gauze, MD;  [facility]  Service: Urology;  Laterality: Bilateral;  . KNEE ARTHROSCOPY WITH MEDIAL MENISECTOMY Right 06/08/2013   Procedure: RIGHT KNEE ARTHROSCOPY WITH LATERAL MENISECTOMY with ABRASION CHRONDROPLASTY and SUPRAPATELLAR SYNOVECTOMY;  Surgeon: Jacki Cones, MD;  Location: [facility];  Service: Orthopedics;  Laterality: Right;  . LAPAROSCOPIC CHOLECYSTECTOMY  1996  . LAPAROSCOPIC GASTRIC RESTRICTIVE DUODENAL PROCEDURE (DUODENAL SWITCH)  11-11-2016     [facility] [place] , [place]  . TONSILLECTOMY  child  . TOTAL HIP ARTHROPLASTY Left 04-02-2006   dr Gladstone Lighter   [facility]  . TOTAL KNEE ARTHROPLASTY Right 01/25/2015   Procedure: TOTAL KNEE  ARTHROPLASTY;  Surgeon: Latanya Maudlin, MD;  Location: [facility];  Service: Orthopedics;  Laterality: Right;     OB History   No obstetric history on file.     Family History  Problem Relation Age of Onset  . Asthma Mother   . Asthma Sister   . Urticaria Neg Hx   . Eczema Neg Hx   . Allergic rhinitis Neg Hx     Social History   Tobacco Use  . Smoking status: Never Smoker  . Smokeless tobacco: Never Used  Substance Use Topics  . Alcohol use: No  . Drug use: No    Home Medications Prior to Admission medications   Medication Sig Start Date End Date Taking? Authorizing Provider  acetaminophen (TYLENOL) 500 MG tablet Take 1,000 mg by mouth every 6 (six) hours as needed for mild pain.   Yes [provider]  albuterol (PROVENTIL HFA;VENTOLIN HFA) 108 (90 Base) MCG/ACT inhaler Inhale 1 puff into the lungs every 6 (six) hours as needed for wheezing or shortness of breath. 10/13/17  Yes Bobbitt, [place] Muta, MD  albuterol (PROVENTIL) (2.5 MG/3ML) 0.083% nebulizer solution Inhale 3 mLs into the lungs every 6 (six) hours as needed for shortness of breath.   Yes [provider]  calcium citrate-vitamin D (CELEBRATE CALCIUM CITRATE) 500-500 MG-UNIT chewable tablet Chew 2 tablets by mouth daily.   Yes [provider]  docusate sodium (COLACE) 100 MG capsule Take 100 mg by mouth 2 (two) times daily.   Yes [provider]  EPINEPHrine (EPIPEN 2-PAK) 0.3 mg/0.3 mL IJ SOAJ injection Inject 0.3 mg into the muscle as needed for anaphylaxis.    Yes [provider]  ferrous sulfate 325 (65 FE) MG tablet Take 325 mg by mouth daily with breakfast.    Yes [provider]  fexofenadine (ALLEGRA) 180 MG tablet Take 180 mg by mouth daily.   Yes [provider]  fluticasone (FLONASE) 50 MCG/ACT nasal spray Place 2 sprays into both nostrils at bedtime.    Yes [provider]  gabapentin (NEURONTIN) 300 MG capsule Take 600 mg by mouth at  bedtime.  12/24/18  Yes [provider]  HYDROcodone-acetaminophen (NORCO/VICODIN) 5-325 MG tablet Take 1-2 tablets by mouth every 6 (six) hours as needed for severe pain. 04/24/19  Yes Joy, Shawn C, PA-C  levothyroxine (SYNTHROID, LEVOTHROID) 137 MCG tablet Take 137 mcg by mouth daily before breakfast.    Yes [provider]  lidocaine (LIDODERM) 5 % Place 1 patch onto the skin daily. Remove & Discard patch within 12 hours or as directed by MD 04/24/19  Yes Joy, Shawn C, PA-C  Melatonin 10 MG TABS Take 10 mg by mouth at bedtime.    Yes [provider]  methocarbamol (ROBAXIN) 500 MG tablet Take 1 tablet (500 mg total) by mouth 2 (two) times daily. 04/24/19  Yes Joy, Shawn C, PA-C  Multiple Vitamin (MULTIVITAMIN) tablet Take 1 tablet by mouth daily. Celebrity nuitriional supplement multiple vitaman complete chews   Yes [provider]  pantoprazole (PROTONIX) 40 MG tablet  Take by mouth. 04/01/19 05/01/19 Yes [provider]  cephALEXin (KEFLEX) 500 MG capsule Take 1 capsule (500 mg total) by mouth 4 (four) times daily for 7 days. 04/26/19 05/03/19  Milagros Loll, MD  oxyCODONE-acetaminophen (PERCOCET) 5-325 MG tablet Take 1 tablet by mouth every 4 (four) hours as needed for severe pain. 04/26/19   Milagros Loll, MD    Allergies    Bee venom, Codeine, Guaifenesin & derivatives, Molds & smuts, and Pollen extract  Review of Systems   Review of Systems  Constitutional: Negative for chills and fever.  HENT: Negative for ear pain and sore throat.   Eyes: Negative for pain and visual disturbance.  Respiratory: Negative for cough and shortness of breath.   Cardiovascular: Negative for chest pain and palpitations.  Gastrointestinal: Negative for abdominal pain and vomiting.  Genitourinary: Negative for dysuria and hematuria.  Musculoskeletal: Negative for arthralgias and back pain.  Skin: Negative for color change and rash.  Neurological: Positive for  weakness and numbness. Negative for seizures and syncope.  All other systems reviewed and are negative.   Physical Exam Updated Vital Signs BP (!) 131/98   Pulse (!) 117   Temp 97.7 F (36.5 C) (Oral)   Resp 14   Ht 5\' 6"  (1.676 m)   Wt 108.4 kg   LMP 06/08/2009   SpO2 95%   BMI 38.58 kg/m   Physical Exam Vitals and nursing note reviewed.  Constitutional:      General: She is not in acute distress.    Appearance: She is well-developed.  HENT:     Head: Normocephalic and atraumatic.  Eyes:     Conjunctiva/sclera: Conjunctivae normal.  Cardiovascular:     Rate and Rhythm: Normal rate and regular rhythm.     Heart sounds: No murmur.  Pulmonary:     Effort: Pulmonary effort is normal. No respiratory distress.     Breath sounds: Normal breath sounds.  Abdominal:     Palpations: Abdomen is soft.     Tenderness: There is no abdominal tenderness.  Musculoskeletal:     Cervical back: Neck supple.     Comments: Back: low L spine TTP, no deformity or step off, no TTP over T or C spine RUE: no TTP or deformity throughout LUE: no TTP or deformity throughout RLE: TTP noted over right knee, mild knee swelling appreciated, knee ROM limited due to pain, no TTP throughout remainder of extremity, distal sensation, pulses, motor intact LLE: no TTP or deformity throughout  Skin:    General: Skin is warm and dry.  Neurological:     Mental Status: She is alert.     Comments: AAOx3, sensation to light touch intact in b/l LE, 5/5 in LLE throughout; in RLE limited to knee pain but 5/5 in EHL, plantar, dorsiflexion  Psychiatric:        Mood and Affect: Mood normal.        Behavior: Behavior normal.     ED Results / Procedures / Treatments   Labs (all labs ordered are listed, but only abnormal results are displayed) Labs Reviewed  CBC WITH DIFFERENTIAL/PLATELET - Abnormal; Notable for the following components:      Result Value   RBC 5.27 (*)    Hemoglobin 15.4 (*)    HCT 48.7 (*)      All other components within normal limits  BASIC METABOLIC PANEL - Abnormal; Notable for the following components:   Glucose, Bld 102 (*)    All other  components within normal limits  URINALYSIS, ROUTINE W REFLEX MICROSCOPIC - Abnormal; Notable for the following components:   APPearance HAZY (*)    Protein, ur 30 (*)    Leukocytes,Ua LARGE (*)    Bacteria, UA MANY (*)    All other components within normal limits    EKG None  Radiology CT Knee Right Wo Contrast  Result Date: 04/26/2019 CLINICAL DATA:  Right knee pain after a fall last night. Initial encounter. EXAM: CT OF THE RIGHT KNEE WITHOUT CONTRAST TECHNIQUE: Multidetector CT imaging of the right knee was performed according to the standard protocol. Multiplanar CT image reconstructions were also generated. COMPARISON:  Plain films right knee earlier today. FINDINGS: Bones/Joint/Cartilage Total knee arthroplasty is in place. The device is located without evidence of loosening or other hardware complication. No fracture or focal bony lesion. Ligaments Suboptimally assessed by CT. Muscles and Tendons Intact and normal in appearance. Soft tissues Moderate joint effusion. IMPRESSION: Moderate joint effusion. The examination is otherwise negative. No fracture. Electronically Signed   By: Drusilla Kanner M.D.   On: 04/26/2019 09:42   MR Lumbar Spine W Wo Contrast  Result Date: 04/26/2019 CLINICAL DATA:  Lower back pain bilateral leg weakness and numbness EXAM: MRI LUMBAR SPINE WITHOUT AND WITH CONTRAST TECHNIQUE: Multiplanar and multiecho pulse sequences of the lumbar spine were obtained without and with intravenous contrast. CONTRAST:  3mL GADAVIST GADOBUTROL 1 MMOL/ML IV SOLN COMPARISON:  None. FINDINGS: Segmentation: There are 5 non-rib bearing lumbar type vertebral bodies with the last intervertebral disc space labeled as L5-S1. Alignment:  There is a mild leftward curvature of the lumbar spine. Vertebrae: The vertebral body heights are  well maintained. No fracture, marrow edema,or pathologic marrow infiltration. Conus medullaris and cauda equina: Conus extends to the L1 level. Conus and cauda equina appear normal. Paraspinal and other soft tissues: The paraspinal soft tissues and visualized retroperitoneal structures are unremarkable. Bilateral probable parapelvic cysts are seen. Tiny T2 hyperintense lesions are seen within both kidneys. The sacroiliac joints are intact. Disc levels: T12-L1:  No significant canal or neural foraminal narrowing. L1-L2:   No significant canal or neural foraminal narrowing. L2-L3: There is a broad-based disc bulge with facet arthrosis with mild bilateral neural foraminal narrowing. L3-L4: There is a broad-based disc bulge with ligamentum flavum hypertrophy and facet arthrosis which causes mild-to-moderate left and mild right neural foraminal narrowing. L4-L5: There is a broad-based disc bulge with a posterior annular fissure, ligamentum flavum hypertrophy and facet arthrosis which causes severe left and moderate to severe right bilateral neural foraminal narrowing. The central thecal sac is effaced measuring 7 mm in AP diameter. L5-S1: There is a broad-based disc bulge with ligamentum flavum hypertrophy and facet arthrosis which causes moderate to severe right and moderate left neural foraminal narrowing. There is mild central canal stenosis. IMPRESSION: Mild leftward curvature of the lumbar spine. Lumbar spine spondylosis most notable at L4-L5 with severe left and moderate to severe right neural foraminal narrowing and mild to moderate central canal stenosis. Electronically Signed   By: Jonna Clark M.D.   On: 04/26/2019 11:33   DG Knee Complete 4 Views Right  Result Date: 04/26/2019 CLINICAL DATA:  Pain following fall EXAM: RIGHT KNEE - COMPLETE 4+ VIEW COMPARISON:  September 29, 2012 FINDINGS: Frontal, lateral, and bilateral oblique views obtained. Patient is status post total knee replacement with femoral and  tibial prosthetic components well-seated. No fracture or dislocation. There is a moderate joint effusion. No erosive change. IMPRESSION: Status post total  knee replacement with prosthetic components well-seated. Moderate joint effusion. No fracture or dislocation. Electronically Signed   By: Bretta BangWilliam  Woodruff III M.D.   On: 04/26/2019 08:35    Procedures Procedures (including critical care time)  Medications Ordered in ED Medications  oxyCODONE-acetaminophen (PERCOCET/ROXICET) 5-325 MG per tablet 1 tablet (1 tablet Oral Given 04/26/19 0811)  LORazepam (ATIVAN) injection 1 mg (1 mg Intravenous Given 04/26/19 1015)  diazepam (VALIUM) tablet 2 mg (2 mg Oral Given 04/26/19 0952)  fentaNYL (SUBLIMAZE) injection 50 mcg (50 mcg Intravenous Given 04/26/19 0953)  gadobutrol (GADAVIST) 1 MMOL/ML injection 10 mL (10 mLs Intravenous Contrast Given 04/26/19 1119)  acetaminophen (TYLENOL) tablet 650 mg (650 mg Oral Given 04/26/19 1207)  sodium chloride 0.9 % bolus 1,000 mL (0 mLs Intravenous Stopped 04/26/19 1445)  cefTRIAXone (ROCEPHIN) 1 g in sodium chloride 0.9 % 100 mL IVPB (0 g Intravenous Stopped 04/26/19 1306)    ED Course  I have reviewed the triage vital signs and the nursing notes.  Pertinent labs & imaging results that were available during my care of the patient were reviewed by me and considered in my medical decision making (see chart for details).  Clinical Course as of Apr 25 1625  Mon Apr 26, 2019  1221 Reviewed with ostergard, no acute NSGY needs, doesn't explain weakness/numbness   [RD]  1335 D/w [place] - for now, doesn't need in pt work up or further testing, would reconsider if rapidly worsening   [RD]    Clinical Course User Index [RD] Milagros Lollykstra, Zakaria Sedor S, MD   MDM Rules/Calculators/A&P                      64 year old lady presenting to ER after fall with right knee pain as well as ongoing low back pain.  Regarding fall and knee pain, x-ray was negative but due to her focal tenderness,  check CT scan to rule out occult fracture.  CT was negative.  No other apparent trauma on my exam.  She reported low back pain with progressive lower extremity weakness and difficulty walking.  Noted good strength bilaterally although the right lower extremity was limited due to pain, sensation also intact.  Given history, checked MRI to further assess.  Notable for "Lumbar spine spondylosis most notable at L4-L5 with severe left and moderate to severe right neural foraminal narrowing and mild to moderate central canal stenosis."  Reviewed this finding as well as patient's history with neurosurgery on-call, Dr. Johnsie Cancelstergaard.  No acute neurosurgical needs at this time.  Can follow-up outpatient.  Reviewed with neurology, low suspicion for acute neurologic process.  Given work-up today, does not feel any further work-up needed.  Reassessed patient, had some difficulty with ambulation due to primarily pain in her right knee.  Basic labs were unremarkable, urine with likely UTI.  Case management helped arrange for home health eval and home, wheelchair, patient already with walker at home.  Reviewed return precautions in detail with patient.  Provided Rx for Keflex as well as short Rx for Percocet.  Recommended follow-up with her orthopedic doctor regarding her right knee pain, neurosurgery regarding her low back pain and primary care doctor as well.  Pain well controlled, patient remains well-appearing, discharged home.    After the discussed management above, the patient was determined to be safe for discharge.  The patient was in agreement with this plan and all questions regarding their care were answered.  ED return precautions were discussed and the patient will return  to the ED with any significant worsening of condition.  Final Clinical Impression(s) / ED Diagnoses Final diagnoses:  Strain of right knee, initial encounter  Sciatica, unspecified laterality  Urinary tract infection without hematuria, site  unspecified    Rx / DC Orders ED Discharge Orders         Ordered    cephALEXin (KEFLEX) 500 MG capsule  4 times daily     04/26/19 1447    oxyCODONE-acetaminophen (PERCOCET) 5-325 MG tablet  Every 4 hours PRN     04/26/19 1447           Milagros Loll, MD 04/26/19 1630    Milagros Loll, MD 04/27/19 [phone]

## 2019-05-24 NOTE — Telephone Encounter (Signed)
See note

## 2019-12-24 DIAGNOSIS — M79671 Pain in right foot: Secondary | ICD-10-CM | POA: Diagnosis not present

## 2019-12-24 DIAGNOSIS — M25774 Osteophyte, right foot: Secondary | ICD-10-CM | POA: Diagnosis not present

## 2019-12-24 DIAGNOSIS — M65871 Other synovitis and tenosynovitis, right ankle and foot: Secondary | ICD-10-CM | POA: Diagnosis not present

## 2019-12-24 DIAGNOSIS — M25571 Pain in right ankle and joints of right foot: Secondary | ICD-10-CM | POA: Diagnosis not present

## 2020-01-03 DIAGNOSIS — G2581 Restless legs syndrome: Secondary | ICD-10-CM | POA: Diagnosis not present

## 2020-01-03 DIAGNOSIS — Z6839 Body mass index (BMI) 39.0-39.9, adult: Secondary | ICD-10-CM | POA: Diagnosis not present

## 2020-01-03 DIAGNOSIS — F329 Major depressive disorder, single episode, unspecified: Secondary | ICD-10-CM | POA: Diagnosis not present

## 2020-01-07 DIAGNOSIS — M25571 Pain in right ankle and joints of right foot: Secondary | ICD-10-CM | POA: Diagnosis not present

## 2020-01-07 DIAGNOSIS — M65871 Other synovitis and tenosynovitis, right ankle and foot: Secondary | ICD-10-CM | POA: Diagnosis not present

## 2020-01-12 DIAGNOSIS — K5900 Constipation, unspecified: Secondary | ICD-10-CM | POA: Diagnosis not present

## 2020-01-12 DIAGNOSIS — Z9884 Bariatric surgery status: Secondary | ICD-10-CM | POA: Diagnosis not present

## 2020-01-12 DIAGNOSIS — K9089 Other intestinal malabsorption: Secondary | ICD-10-CM | POA: Diagnosis not present

## 2020-01-12 DIAGNOSIS — Z713 Dietary counseling and surveillance: Secondary | ICD-10-CM | POA: Diagnosis not present

## 2020-01-12 DIAGNOSIS — Z6838 Body mass index (BMI) 38.0-38.9, adult: Secondary | ICD-10-CM | POA: Diagnosis not present

## 2020-01-24 DIAGNOSIS — H43811 Vitreous degeneration, right eye: Secondary | ICD-10-CM | POA: Diagnosis not present

## 2020-01-24 DIAGNOSIS — H43393 Other vitreous opacities, bilateral: Secondary | ICD-10-CM | POA: Diagnosis not present

## 2020-01-24 DIAGNOSIS — H2513 Age-related nuclear cataract, bilateral: Secondary | ICD-10-CM | POA: Diagnosis not present

## 2020-01-28 DIAGNOSIS — M65871 Other synovitis and tenosynovitis, right ankle and foot: Secondary | ICD-10-CM | POA: Diagnosis not present

## 2020-01-28 DIAGNOSIS — M25571 Pain in right ankle and joints of right foot: Secondary | ICD-10-CM | POA: Diagnosis not present

## 2020-02-03 DIAGNOSIS — Z9884 Bariatric surgery status: Secondary | ICD-10-CM | POA: Diagnosis not present

## 2020-02-03 DIAGNOSIS — Z6838 Body mass index (BMI) 38.0-38.9, adult: Secondary | ICD-10-CM | POA: Diagnosis not present

## 2020-02-03 DIAGNOSIS — K5900 Constipation, unspecified: Secondary | ICD-10-CM | POA: Diagnosis not present

## 2020-02-03 DIAGNOSIS — K9089 Other intestinal malabsorption: Secondary | ICD-10-CM | POA: Diagnosis not present

## 2020-02-03 DIAGNOSIS — Z713 Dietary counseling and surveillance: Secondary | ICD-10-CM | POA: Diagnosis not present

## 2020-02-10 DIAGNOSIS — M5416 Radiculopathy, lumbar region: Secondary | ICD-10-CM | POA: Diagnosis not present

## 2020-03-01 DIAGNOSIS — R062 Wheezing: Secondary | ICD-10-CM | POA: Diagnosis not present

## 2020-03-01 DIAGNOSIS — U071 COVID-19: Secondary | ICD-10-CM | POA: Diagnosis not present

## 2020-03-01 DIAGNOSIS — R509 Fever, unspecified: Secondary | ICD-10-CM | POA: Diagnosis not present

## 2020-03-05 ENCOUNTER — Other Ambulatory Visit: Payer: Self-pay

## 2020-03-05 ENCOUNTER — Encounter (HOSPITAL_COMMUNITY): Payer: Self-pay | Admitting: *Deleted

## 2020-03-05 ENCOUNTER — Emergency Department (HOSPITAL_COMMUNITY): Payer: Medicare HMO

## 2020-03-05 ENCOUNTER — Inpatient Hospital Stay (HOSPITAL_COMMUNITY)
Admission: EM | Admit: 2020-03-05 | Discharge: 2020-03-11 | DRG: 177 | Disposition: A | Discharge status: Home / Self Care | Payer: Medicare HMO | Attending: Internal Medicine | Admitting: Internal Medicine

## 2020-03-05 DIAGNOSIS — U071 COVID-19: Principal | ICD-10-CM | POA: Diagnosis present

## 2020-03-05 DIAGNOSIS — Z885 Allergy status to narcotic agent status: Secondary | ICD-10-CM

## 2020-03-05 DIAGNOSIS — Z825 Family history of asthma and other chronic lower respiratory diseases: Secondary | ICD-10-CM

## 2020-03-05 DIAGNOSIS — Z91030 Bee allergy status: Secondary | ICD-10-CM | POA: Diagnosis not present

## 2020-03-05 DIAGNOSIS — Z7951 Long term (current) use of inhaled steroids: Secondary | ICD-10-CM | POA: Diagnosis not present

## 2020-03-05 DIAGNOSIS — R0602 Shortness of breath: Secondary | ICD-10-CM

## 2020-03-05 DIAGNOSIS — G8929 Other chronic pain: Secondary | ICD-10-CM | POA: Diagnosis present

## 2020-03-05 DIAGNOSIS — M1711 Unilateral primary osteoarthritis, right knee: Secondary | ICD-10-CM | POA: Diagnosis present

## 2020-03-05 DIAGNOSIS — Z87442 Personal history of urinary calculi: Secondary | ICD-10-CM | POA: Diagnosis not present

## 2020-03-05 DIAGNOSIS — Z79899 Other long term (current) drug therapy: Secondary | ICD-10-CM | POA: Diagnosis not present

## 2020-03-05 DIAGNOSIS — G43909 Migraine, unspecified, not intractable, without status migrainosus: Secondary | ICD-10-CM | POA: Diagnosis present

## 2020-03-05 DIAGNOSIS — J3089 Other allergic rhinitis: Secondary | ICD-10-CM | POA: Diagnosis present

## 2020-03-05 DIAGNOSIS — G4733 Obstructive sleep apnea (adult) (pediatric): Secondary | ICD-10-CM | POA: Diagnosis present

## 2020-03-05 DIAGNOSIS — M549 Dorsalgia, unspecified: Secondary | ICD-10-CM | POA: Diagnosis present

## 2020-03-05 DIAGNOSIS — R739 Hyperglycemia, unspecified: Secondary | ICD-10-CM | POA: Diagnosis not present

## 2020-03-05 DIAGNOSIS — J9600 Acute respiratory failure, unspecified whether with hypoxia or hypercapnia: Secondary | ICD-10-CM

## 2020-03-05 DIAGNOSIS — E039 Hypothyroidism, unspecified: Secondary | ICD-10-CM | POA: Diagnosis not present

## 2020-03-05 DIAGNOSIS — Z87892 Personal history of anaphylaxis: Secondary | ICD-10-CM

## 2020-03-05 DIAGNOSIS — R059 Cough, unspecified: Secondary | ICD-10-CM | POA: Diagnosis not present

## 2020-03-05 DIAGNOSIS — Z888 Allergy status to other drugs, medicaments and biological substances status: Secondary | ICD-10-CM | POA: Diagnosis not present

## 2020-03-05 DIAGNOSIS — Z6839 Body mass index (BMI) 39.0-39.9, adult: Secondary | ICD-10-CM | POA: Diagnosis not present

## 2020-03-05 DIAGNOSIS — K219 Gastro-esophageal reflux disease without esophagitis: Secondary | ICD-10-CM | POA: Diagnosis present

## 2020-03-05 DIAGNOSIS — J1282 Pneumonia due to coronavirus disease 2019: Secondary | ICD-10-CM | POA: Diagnosis not present

## 2020-03-05 DIAGNOSIS — Z79890 Hormone replacement therapy (postmenopausal): Secondary | ICD-10-CM | POA: Diagnosis not present

## 2020-03-05 DIAGNOSIS — J45909 Unspecified asthma, uncomplicated: Secondary | ICD-10-CM | POA: Diagnosis present

## 2020-03-05 DIAGNOSIS — J9601 Acute respiratory failure with hypoxia: Secondary | ICD-10-CM

## 2020-03-05 DIAGNOSIS — J189 Pneumonia, unspecified organism: Secondary | ICD-10-CM

## 2020-03-05 DIAGNOSIS — Z91048 Other nonmedicinal substance allergy status: Secondary | ICD-10-CM

## 2020-03-05 DIAGNOSIS — K760 Fatty (change of) liver, not elsewhere classified: Secondary | ICD-10-CM | POA: Insufficient documentation

## 2020-03-05 LAB — APTT: aPTT: 37 seconds — ABNORMAL HIGH (ref 24–36)

## 2020-03-05 LAB — D-DIMER, QUANTITATIVE: D-Dimer, Quant: 1.1 ug/mL-FEU — ABNORMAL HIGH (ref 0.00–0.50)

## 2020-03-05 LAB — CBC WITH DIFFERENTIAL/PLATELET
Abs Immature Granulocytes: 0.03 10*3/uL (ref 0.00–0.07)
Basophils Absolute: 0 10*3/uL (ref 0.0–0.1)
Basophils Relative: 1 %
Eosinophils Absolute: 0.1 10*3/uL (ref 0.0–0.5)
Eosinophils Relative: 2 %
HCT: 40.7 % (ref 36.0–46.0)
Hemoglobin: 12.6 g/dL (ref 12.0–15.0)
Immature Granulocytes: 1 %
Lymphocytes Relative: 20 %
Lymphs Abs: 0.8 10*3/uL (ref 0.7–4.0)
MCH: 29.7 pg (ref 26.0–34.0)
MCHC: 31 g/dL (ref 30.0–36.0)
MCV: 96 fL (ref 80.0–100.0)
Monocytes Absolute: 0.4 10*3/uL (ref 0.1–1.0)
Monocytes Relative: 11 %
Neutro Abs: 2.5 10*3/uL (ref 1.7–7.7)
Neutrophils Relative %: 65 %
Platelets: 126 10*3/uL — ABNORMAL LOW (ref 150–400)
RBC: 4.24 MIL/uL (ref 3.87–5.11)
RDW: 14.2 % (ref 11.5–15.5)
WBC: 3.8 10*3/uL — ABNORMAL LOW (ref 4.0–10.5)
nRBC: 0 % (ref 0.0–0.2)

## 2020-03-05 LAB — PROTIME-INR
INR: 1 (ref 0.8–1.2)
Prothrombin Time: 13.1 seconds (ref 11.4–15.2)

## 2020-03-05 LAB — LACTATE DEHYDROGENASE: LDH: 214 U/L — ABNORMAL HIGH (ref 98–192)

## 2020-03-05 LAB — FIBRINOGEN: Fibrinogen: 739 mg/dL — ABNORMAL HIGH (ref 210–475)

## 2020-03-05 LAB — COMPREHENSIVE METABOLIC PANEL
ALT: 25 U/L (ref 0–44)
AST: 40 U/L (ref 15–41)
Albumin: 3.1 g/dL — ABNORMAL LOW (ref 3.5–5.0)
Alkaline Phosphatase: 54 U/L (ref 38–126)
Anion gap: 9 (ref 5–15)
BUN: 28 mg/dL — ABNORMAL HIGH (ref 8–23)
CO2: 23 mmol/L (ref 22–32)
Calcium: 8.9 mg/dL (ref 8.9–10.3)
Chloride: 108 mmol/L (ref 98–111)
Creatinine, Ser: 1.06 mg/dL — ABNORMAL HIGH (ref 0.44–1.00)
GFR, Estimated: 59 mL/min — ABNORMAL LOW (ref >60)
Glucose, Bld: 103 mg/dL — ABNORMAL HIGH (ref 70–99)
Potassium: 4.6 mmol/L (ref 3.5–5.1)
Sodium: 140 mmol/L (ref 135–145)
Total Bilirubin: 0.8 mg/dL (ref 0.3–1.2)
Total Protein: 6.6 g/dL (ref 6.5–8.1)

## 2020-03-05 LAB — PROCALCITONIN: Procalcitonin: 4.48 ng/mL

## 2020-03-05 LAB — RESP PANEL BY RT-PCR (FLU A&B, COVID) ARPGX2
Influenza A by PCR: NEGATIVE
Influenza B by PCR: NEGATIVE
SARS Coronavirus 2 by RT PCR: POSITIVE — AB

## 2020-03-05 LAB — TRIGLYCERIDES: Triglycerides: 37 mg/dL (ref <150)

## 2020-03-05 LAB — FERRITIN: Ferritin: 729 ng/mL — ABNORMAL HIGH (ref 11–307)

## 2020-03-05 LAB — C-REACTIVE PROTEIN: CRP: 13.3 mg/dL — ABNORMAL HIGH (ref <1.0)

## 2020-03-05 LAB — LACTIC ACID, PLASMA: Lactic Acid, Venous: 0.7 mmol/L (ref 0.5–1.9)

## 2020-03-05 MED ORDER — PREDNISONE 50 MG PO TABS: 50.0000 mg | ORAL_TABLET | Freq: Every day | ORAL | Status: DC

## 2020-03-05 MED ORDER — ZINC SULFATE 220 (50 ZN) MG PO CAPS
220.0000 mg | ORAL_CAPSULE | Freq: Every day | ORAL | Status: DC
  Administered 2020-03-06 – 2020-03-11 (×6): 220 mg via ORAL
  Filled 2020-03-05 (×6): qty 1

## 2020-03-05 MED ORDER — FERROUS SULFATE 325 (65 FE) MG PO TABS: 325.0000 mg | ORAL_TABLET | Freq: Every day | ORAL | Status: DC

## 2020-03-05 MED ORDER — PANTOPRAZOLE SODIUM 40 MG PO TBEC
40.0000 mg | DELAYED_RELEASE_TABLET | Freq: Every day | ORAL | Status: DC
  Administered 2020-03-06 – 2020-03-11 (×6): 40 mg via ORAL
  Filled 2020-03-05 (×6): qty 1

## 2020-03-05 MED ORDER — DEXAMETHASONE SODIUM PHOSPHATE 10 MG/ML IJ SOLN
10.0000 mg | Freq: Once | INTRAMUSCULAR | Status: AC
  Administered 2020-03-05: 10 mg via INTRAVENOUS
  Filled 2020-03-05: qty 1

## 2020-03-05 MED ORDER — CALCIUM CITRATE-VITAMIN D 500-500 MG-UNIT PO CHEW
2.0000 | CHEWABLE_TABLET | Freq: Every day | ORAL | Status: DC
  Filled 2020-03-05 (×2): qty 2

## 2020-03-05 MED ORDER — FLUTICASONE PROPIONATE 50 MCG/ACT NA SUSP
2.0000 | Freq: Every day | NASAL | Status: DC
  Administered 2020-03-05 – 2020-03-10 (×6): 2 via NASAL
  Filled 2020-03-05: qty 16

## 2020-03-05 MED ORDER — ONDANSETRON HCL 4 MG/2ML IJ SOLN: 4.0000 mg | Freq: Four times a day (QID) | INTRAMUSCULAR | Status: DC | PRN

## 2020-03-05 MED ORDER — GABAPENTIN 300 MG PO CAPS
600.0000 mg | ORAL_CAPSULE | Freq: Every day | ORAL | Status: DC
  Administered 2020-03-05 – 2020-03-10 (×6): 600 mg via ORAL
  Filled 2020-03-05 (×6): qty 2

## 2020-03-05 MED ORDER — LORATADINE 10 MG PO TABS
10.0000 mg | ORAL_TABLET | Freq: Every day | ORAL | Status: DC
  Administered 2020-03-06 – 2020-03-11 (×6): 10 mg via ORAL
  Filled 2020-03-05 (×6): qty 1

## 2020-03-05 MED ORDER — ASCORBIC ACID 500 MG PO TABS
500.0000 mg | ORAL_TABLET | Freq: Two times a day (BID) | ORAL | Status: DC
  Administered 2020-03-05 – 2020-03-11 (×12): 500 mg via ORAL
  Filled 2020-03-05 (×12): qty 1

## 2020-03-05 MED ORDER — AZITHROMYCIN 500 MG IV SOLR
500.0000 mg | INTRAVENOUS | Status: DC
  Administered 2020-03-05 – 2020-03-07 (×3): 500 mg via INTRAVENOUS
  Filled 2020-03-05 (×5): qty 500

## 2020-03-05 MED ORDER — BENZONATATE 100 MG PO CAPS
200.0000 mg | ORAL_CAPSULE | Freq: Two times a day (BID) | ORAL | Status: DC | PRN
  Administered 2020-03-05 – 2020-03-11 (×8): 200 mg via ORAL
  Filled 2020-03-05 (×9): qty 2

## 2020-03-05 MED ORDER — ACETAMINOPHEN 325 MG PO TABS
650.0000 mg | ORAL_TABLET | Freq: Four times a day (QID) | ORAL | Status: DC | PRN
  Administered 2020-03-06 – 2020-03-10 (×4): 650 mg via ORAL
  Filled 2020-03-05 (×4): qty 2

## 2020-03-05 MED ORDER — IPRATROPIUM BROMIDE HFA 17 MCG/ACT IN AERS
2.0000 | INHALATION_SPRAY | Freq: Four times a day (QID) | RESPIRATORY_TRACT | Status: DC
  Administered 2020-03-05 – 2020-03-11 (×21): 2 via RESPIRATORY_TRACT
  Filled 2020-03-05: qty 12.9

## 2020-03-05 MED ORDER — SODIUM CHLORIDE 0.9 % IV SOLN
100.0000 mg | Freq: Every day | INTRAVENOUS | Status: AC
  Administered 2020-03-06 – 2020-03-09 (×4): 100 mg via INTRAVENOUS
  Filled 2020-03-05 (×4): qty 20

## 2020-03-05 MED ORDER — LACTATED RINGERS IV SOLN: INTRAVENOUS | Status: AC

## 2020-03-05 MED ORDER — ENOXAPARIN SODIUM 60 MG/0.6ML ~~LOC~~ SOLN
60.0000 mg | SUBCUTANEOUS | Status: DC
  Administered 2020-03-05 – 2020-03-10 (×6): 60 mg via SUBCUTANEOUS
  Filled 2020-03-05 (×7): qty 0.6

## 2020-03-05 MED ORDER — SODIUM CHLORIDE 0.9 % IV SOLN
200.0000 mg | Freq: Once | INTRAVENOUS | Status: AC
  Administered 2020-03-05: 200 mg via INTRAVENOUS
  Filled 2020-03-05: qty 200

## 2020-03-05 MED ORDER — METHYLPREDNISOLONE SODIUM SUCC 125 MG IJ SOLR
62.5000 mg | Freq: Two times a day (BID) | INTRAMUSCULAR | Status: DC
  Administered 2020-03-06: 62.5 mg via INTRAVENOUS
  Filled 2020-03-05: qty 2

## 2020-03-05 MED ORDER — SODIUM CHLORIDE 0.9 % IV SOLN
2.0000 g | INTRAVENOUS | Status: AC
  Administered 2020-03-05 – 2020-03-09 (×5): 2 g via INTRAVENOUS
  Filled 2020-03-05 (×2): qty 20
  Filled 2020-03-05 (×2): qty 2
  Filled 2020-03-05: qty 20

## 2020-03-05 MED ORDER — LEVOTHYROXINE SODIUM 25 MCG PO TABS
137.0000 ug | ORAL_TABLET | Freq: Every day | ORAL | Status: DC
  Administered 2020-03-06 – 2020-03-11 (×6): 137 ug via ORAL
  Filled 2020-03-05 (×7): qty 1

## 2020-03-05 MED ORDER — MENTHOL 3 MG MT LOZG: 1.0000 | LOZENGE | OROMUCOSAL | Status: DC | PRN

## 2020-03-05 MED ORDER — ONDANSETRON HCL 4 MG PO TABS: 4.0000 mg | ORAL_TABLET | Freq: Four times a day (QID) | ORAL | Status: DC | PRN

## 2020-03-05 NOTE — Progress Notes (Addendum)
Notified bedside nurse of need to draw blood cultures.  Bedside RN Ilsa Iha confirms blood cultures were drawn prior to abx started

## 2020-03-05 NOTE — ED Triage Notes (Signed)
Tested (+) Covid Tuesday, cough, headache and shob. RA 81% 3L  93%. She does not have copy of home test

## 2020-03-05 NOTE — ED Notes (Signed)
Floor nurse in a room and is unable to take report at this time.

## 2020-03-05 NOTE — ED Notes (Signed)
02 increased to 4 l due to sates dropping to 89, now 91%

## 2020-03-05 NOTE — ED Provider Notes (Signed)
[facility]-EMERGENCY DEPT Provider Note   CSN: [id number] Arrival date & time: 03/05/20  1301     History Chief Complaint  Patient presents with  . Shortness of Breath  . Cough  . Headache    Rachel [name] is a 65 y.o. female.  HPI   This patient is a 65 year old female, she has a history of asthma when she was younger, she has allergic rhinitis, she has a history of hypothyroidism and had been vaccinated for COVID-19 in April 2021.  She states that she was exposed to a COVID-19 positive patient on Saturday, on Monday she started to develop symptoms of a sore throat and on Tuesday she decided to take her home test after starting to cough which was positive.  Her husband was also positive but has improved significantly.  Unfortunately for her she has started to develop increasing shortness of breath, increasing coughing and progressive weakness.  She has no nausea vomiting or diarrhea, she states that her fevers have gone away though they were as high as 102.6 at one point.  She has not had any swelling of the legs rashes of the skin or headache.  She was given oral antiviral medications by her physician which she has taken for the last several days without significant improvement, she was also given steroids which have not seemed to help as well as a Flovent inhaler which has not seemed to help.  She has continued to use her albuterol.  She has become progressively dyspneic and was approximately 84% on arrival  Past Medical History:  Diagnosis Date  . Allergic rhinitis   . GERD (gastroesophageal reflux disease)   . H/O bariatric surgery 10/2016  . History of hiatal hernia   . History of kidney stones   . Hypothyroidism   . Migraines   . Mild asthma   . OA (osteoarthritis)   . Recurrent upper respiratory infection (URI)   . Renal calculus, bilateral   . Wears glasses     Patient Active Problem List   Diagnosis Date Noted  . Multifocal pneumonia 03/05/2020   . Fatty liver 03/05/2020  . Gastro-esophageal reflux disease without esophagitis 03/05/2020  . Hypothyroidism 03/05/2020  . Pneumonia 03/05/2020  . Allergic rhinitis 10/13/2017  . Allergy to insect bites and stings 10/13/2017  . Tinea 10/13/2017  . History of wheezing 10/13/2017  . Obstructive sleep apnea syndrome 08/18/2016  . Morbid obesity (HCC) 05/28/2016  . History of total knee arthroplasty 01/25/2015  . Osteoarthritis of right knee 06/08/2013  . Lateral meniscus tear, current 06/08/2013    Past Surgical History:  Procedure Laterality Date  . ANKLE ARTHROSCOPY Left 01-27-2002    [facility]  . BREAST SURGERY     lumpectomy x 2 - benign   . CARPAL TUNNEL RELEASE Bilateral right 08-23-2003   dr sypher [facility]/  left 2006 approx.  . CYSTOSCOPY WITH RETROGRADE PYELOGRAM, URETEROSCOPY AND STENT PLACEMENT Bilateral 01/20/2017   Procedure: CYSTOSCOPY WITH RETROGRADE PYELOGRAM, URETEROSCOPY AND STENT PLACEMENT;  Surgeon: Malen GauzeMcKenzie, Patrick L, MD;  [facility];  Service: Urology;  Laterality: Bilateral;  . DILATION AND CURETTAGE OF UTERUS     several   . ESOPHAGOGASTRODUODENOSCOPY  09/2016  . HOLMIUM LASER APPLICATION Bilateral 01/20/2017   Procedure: HOLMIUM LASER APPLICATION;  Surgeon: Malen GauzeMcKenzie, Patrick L, MD;  [facility] ;  Service: Urology;  Laterality: Bilateral;  . KNEE ARTHROSCOPY WITH MEDIAL MENISECTOMY Right 06/08/2013   Procedure: RIGHT KNEE ARTHROSCOPY  WITH LATERAL MENISECTOMY with ABRASION CHRONDROPLASTY and SUPRAPATELLAR SYNOVECTOMY;  Surgeon: Jacki Cones, MD;  Location: [facility];  Service: Orthopedics;  Laterality: Right;  . LAPAROSCOPIC CHOLECYSTECTOMY  1996  . LAPAROSCOPIC GASTRIC RESTRICTIVE DUODENAL PROCEDURE (DUODENAL SWITCH)  11-11-2016     [facility] in [place] , [place]   AND HIATAL HERNIA REPAIR  . TONSILLECTOMY  child  . TOTAL HIP ARTHROPLASTY Left 04-02-2006   dr Darrelyn Hillock   [facility]  . TOTAL KNEE ARTHROPLASTY Right  01/25/2015   Procedure: TOTAL KNEE ARTHROPLASTY;  Surgeon: Ranee Gosselin, MD;  Location: [facility];  Service: Orthopedics;  Laterality: Right;     OB History   No obstetric history on file.     Family History  Problem Relation Age of Onset  . Asthma Mother   . Asthma Sister   . Urticaria Neg Hx   . Eczema Neg Hx   . Allergic rhinitis Neg Hx     Social History   Tobacco Use  . Smoking status: Never Smoker  . Smokeless tobacco: Never Used  Vaping Use  . Vaping Use: Never used  Substance Use Topics  . Alcohol use: No  . Drug use: No    Home Medications Prior to Admission medications   Medication Sig Start Date End Date Taking? Authorizing Provider  acetaminophen (TYLENOL) 500 MG tablet Take 1,000 mg by mouth every 6 (six) hours as needed for mild pain.   Yes [provider]  albuterol (PROVENTIL HFA;VENTOLIN HFA) 108 (90 Base) MCG/ACT inhaler Inhale 1 puff into the lungs every 6 (six) hours as needed for wheezing or shortness of breath. 10/13/17  Yes Bobbitt, Heywood Iles, MD  albuterol (PROVENTIL) (2.5 MG/3ML) 0.083% nebulizer solution Inhale 3 mLs into the lungs every 6 (six) hours as needed for shortness of breath.   Yes [provider]  atorvastatin (LIPITOR) 10 MG tablet Take 10 mg by mouth daily. 02/16/20  Yes [provider]  calcium citrate-vitamin D 500-500 MG-UNIT chewable tablet Chew 2 tablets by mouth daily.   Yes [provider]  docusate sodium (COLACE) 100 MG capsule Take 100 mg by mouth 2 (two) times daily.   Yes [provider]  fexofenadine (ALLEGRA) 180 MG tablet Take 180 mg by mouth daily.   Yes [provider]  FLOVENT HFA 110 MCG/ACT inhaler Inhale 1 puff into the lungs 2 (two) times daily. 03/01/20  Yes [provider]  fluticasone (FLONASE) 50 MCG/ACT nasal spray Place 2 sprays into both nostrils at bedtime.    Yes [provider]  gabapentin (NEURONTIN) 300 MG capsule Take 1,200 mg by mouth  at bedtime. 12/24/18  Yes [provider]  ibuprofen (ADVIL) 200 MG tablet Take 200 mg by mouth every 6 (six) hours as needed for moderate pain or headache.   Yes [provider]  levothyroxine (SYNTHROID, LEVOTHROID) 137 MCG tablet Take 137 mcg by mouth daily before breakfast.    Yes [provider]  Melatonin 10 MG TABS Take 10 mg by mouth at bedtime.    Yes [provider]  Multiple Vitamin (MULTIVITAMIN) tablet Take 1 tablet by mouth daily. Celebrity nuitriional supplement multiple vitaman complete chews   Yes [provider]  pantoprazole (PROTONIX) 40 MG tablet Take 40 mg by mouth daily. 01/12/20  Yes [provider]  EPINEPHrine 0.3 mg/0.3 mL IJ SOAJ injection Inject 0.3 mg into the muscle as needed for anaphylaxis.     [provider]  HYDROcodone-acetaminophen (NORCO/VICODIN) 5-325 MG tablet Take  1-2 tablets by mouth every 6 (six) hours as needed for severe pain. Patient not taking: Reported on 03/05/2020 04/24/19   Joy, Ines Bloomer C, PA-C  lidocaine (LIDODERM) 5 % Place 1 patch onto the skin daily. Remove & Discard patch within 12 hours or as directed by MD Patient not taking: Reported on 03/05/2020 04/24/19   Joy, Hillard Danker, PA-C  methocarbamol (ROBAXIN) 500 MG tablet Take 1 tablet (500 mg total) by mouth 2 (two) times daily. Patient not taking: Reported on 03/05/2020 04/24/19   Anselm Pancoast, PA-C  oxyCODONE-acetaminophen (PERCOCET) 5-325 MG tablet Take 1 tablet by mouth every 4 (four) hours as needed for severe pain. 04/26/19   Milagros Loll, MD    Allergies    Bee venom, Codeine, Guaifenesin & derivatives, Molds & smuts, and Pollen extract  Review of Systems   Review of Systems  All other systems reviewed and are negative.   Physical Exam Updated Vital Signs BP 112/70   Pulse 80   Temp 98.6 F (37 C) (Oral)   Resp 18   Ht 1.676 m ( )   Wt 110.2 kg   LMP 06/08/2009   SpO2 99%   BMI 39.22 kg/m   Physical  Exam Vitals and nursing note reviewed.  Constitutional:      General: She is in acute distress.     Appearance: She is well-developed and well-nourished.  HENT:     Head: Normocephalic and atraumatic.     Mouth/Throat:     Mouth: Oropharynx is clear and moist.     Pharynx: No oropharyngeal exudate.  Eyes:     General: No scleral icterus.       Right eye: No discharge.        Left eye: No discharge.     Extraocular Movements: EOM normal.     Conjunctiva/sclera: Conjunctivae normal.     Pupils: Pupils are equal, round, and reactive to light.  Neck:     Thyroid: No thyromegaly.     Vascular: No JVD.  Cardiovascular:     Rate and Rhythm: Normal rate and regular rhythm.     Pulses: Intact distal pulses.     Heart sounds: Normal heart sounds. No murmur heard. No friction rub. No gallop.   Pulmonary:     Effort: Respiratory distress present.     Breath sounds: Wheezing, rhonchi and rales present.  Abdominal:     General: Bowel sounds are normal. There is no distension.     Palpations: Abdomen is soft. There is no mass.     Tenderness: There is no abdominal tenderness.  Musculoskeletal:        General: No tenderness or edema. Normal range of motion.     Cervical back: Normal range of motion and neck supple.  Lymphadenopathy:     Cervical: No cervical adenopathy.  Skin:    General: Skin is warm and dry.     Findings: No erythema or rash.  Neurological:     Mental Status: She is alert.     Coordination: Coordination normal.  Psychiatric:        Mood and Affect: Mood and affect normal.        Behavior: Behavior normal.     ED Results / Procedures / Treatments   Labs (all labs ordered are listed, but only abnormal results are displayed) Labs Reviewed  RESP PANEL BY RT-PCR (FLU A&B, COVID) ARPGX2 - Abnormal; Notable for the following components:      Result Value  SARS Coronavirus 2 by RT PCR POSITIVE (*)    All other components within normal limits  CBC WITH  DIFFERENTIAL/PLATELET - Abnormal; Notable for the following components:   WBC 3.8 (*)    Platelets 126 (*)    All other components within normal limits  COMPREHENSIVE METABOLIC PANEL - Abnormal; Notable for the following components:   Glucose, Bld 103 (*)    BUN 28 (*)    Creatinine, Ser 1.06 (*)    Albumin 3.1 (*)    GFR, Estimated 59 (*)    All other components within normal limits  D-DIMER, QUANTITATIVE (NOT AT [facility]) - Abnormal; Notable for the following components:   D-Dimer, Quant 1.10 (*)    All other components within normal limits  LACTATE DEHYDROGENASE - Abnormal; Notable for the following components:   LDH 214 (*)    All other components within normal limits  FERRITIN - Abnormal; Notable for the following components:   Ferritin 729 (*)    All other components within normal limits  FIBRINOGEN - Abnormal; Notable for the following components:   Fibrinogen 739 (*)    All other components within normal limits  C-REACTIVE PROTEIN - Abnormal; Notable for the following components:   CRP 13.3 (*)    All other components within normal limits  APTT - Abnormal; Notable for the following components:   aPTT 37 (*)    All other components within normal limits  CULTURE, BLOOD (ROUTINE X 2)  CULTURE, BLOOD (ROUTINE X 2)  URINE CULTURE  LACTIC ACID, PLASMA  PROCALCITONIN  TRIGLYCERIDES  PROTIME-INR  LACTIC ACID, PLASMA  URINALYSIS, ROUTINE W REFLEX MICROSCOPIC  HIV ANTIBODY (ROUTINE TESTING W REFLEX)  CBC  CREATININE, SERUM  CBC WITH DIFFERENTIAL/PLATELET  COMPREHENSIVE METABOLIC PANEL  C-REACTIVE PROTEIN  D-DIMER, QUANTITATIVE (NOT AT [facility])  PROCALCITONIN    EKG None  Radiology DG Chest Port 1 View  Result Date: 03/05/2020 CLINICAL DATA:  COVID-19 positive with cough and shortness of breath EXAM: PORTABLE CHEST 1 VIEW COMPARISON:  January 12, 2015 FINDINGS: There is airspace opacity throughout much of the left lung. Right lung is clear. Heart size and pulmonary  vascularity are normal. No adenopathy. No bone lesions. IMPRESSION: Airspace opacity consistent with pneumonia throughout much of the left lung. Suspect multifocal pneumonia, likely of atypical organism etiology. Right lung clear. Cardiac silhouette within normal limits. Electronically Signed   By: Bretta Bang III M.D.   On: 03/05/2020 14:10    Procedures .Critical Care Performed by: Eber Hong, MD Authorized by: Eber Hong, MD   Critical care provider statement:    Critical care time (minutes):  35   Critical care time was exclusive of:  Separately billable procedures and treating other patients and teaching time   Critical care was necessary to treat or prevent imminent or life-threatening deterioration of the following conditions:  Respiratory failure   Critical care was time spent personally by me on the following activities:  Blood draw for specimens, development of treatment plan with patient or surrogate, discussions with consultants, evaluation of patient's response to treatment, examination of patient, obtaining history from patient or surrogate, ordering and performing treatments and interventions, ordering and review of laboratory studies, ordering and review of radiographic studies, pulse oximetry, re-evaluation of patient's condition and review of old charts Comments:           Medications Ordered in ED Medications  lactated ringers infusion (has no administration in time range)  cefTRIAXone (ROCEPHIN) 2 g in sodium chloride 0.9 %  100 mL IVPB (0 g Intravenous Stopped 03/05/20 1724)  azithromycin (ZITHROMAX) 500 mg in sodium chloride 0.9 % 250 mL IVPB (0 mg Intravenous Stopped 03/05/20 1724)  remdesivir 200 mg in sodium chloride 0.9% 250 mL IVPB (has no administration in time range)  remdesivir 100 mg in sodium chloride 0.9 % 100 mL IVPB (has no administration in time range)  enoxaparin (LOVENOX) injection 40 mg (has no administration in time range)   methylPREDNISolone sodium succinate (SOLU-MEDROL) 125 mg/2 mL injection 55 mg (has no administration in time range)    Followed by  predniSONE (DELTASONE) tablet 50 mg (has no administration in time range)  acetaminophen (TYLENOL) tablet 650 mg (has no administration in time range)  ondansetron (ZOFRAN) tablet 4 mg (has no administration in time range)    Or  ondansetron (ZOFRAN) injection 4 mg (has no administration in time range)  ipratropium (ATROVENT HFA) inhaler 2 puff (has no administration in time range)  calcium citrate-vitamin D 500-500 MG-UNIT per chewable tablet 2 tablet (has no administration in time range)  ferrous sulfate tablet 325 mg (has no administration in time range)  fluticasone (FLONASE) 50 MCG/ACT nasal spray 2 spray (has no administration in time range)  loratadine (CLARITIN) tablet 10 mg (has no administration in time range)  gabapentin (NEURONTIN) capsule 600 mg (has no administration in time range)  levothyroxine (SYNTHROID) tablet 137 mcg (has no administration in time range)  menthol-cetylpyridinium (CEPACOL) lozenge 3 mg (has no administration in time range)  zinc sulfate capsule 220 mg (has no administration in time range)  ascorbic acid (VITAMIN C) tablet 500 mg (has no administration in time range)  pantoprazole (PROTONIX) EC tablet 40 mg (has no administration in time range)  dexamethasone (DECADRON) injection 10 mg (10 mg Intravenous Given 03/05/20 1839)    ED Course  I have reviewed the triage vital signs and the nursing notes.  Pertinent labs & imaging results that were available during my care of the patient were reviewed by me and considered in my medical decision making (see chart for details).    MDM Rules/Calculators/A&P                           This patient is ill-appearing with acute hypoxic respiratory failure secondary to COVID-19.  She will need a formal test to confirm, her chest x-ray is somewhat atypical for Covid and that there is  unilateral hazy opacities that take up much of the left side of the hemithorax.  We will treat for possible bacterial coinfection, she will need oxygen, will order remdesivir and plan on admission to the hospital.  She is critically ill, she is requiring 4 L of nasal cannula oxygen to be in the 92+ percent range.  She is agreeable to the plan.  Sepsis protocols will be followed secondary to her clinical condition source of infection though she does not appear to be in shock at this time.  Fluids - lactic < 1 - no bolus needed O2 - being given Antivirals being given abx being given Decadron given Hospitalist consulted to admit  Vitals:   03/05/20 1320 03/05/20 1323 03/05/20 1324 03/05/20 1530  BP:  118/68  117/74  Pulse:  95  82  Resp:  18  18  Temp:  98.6 F (37 C)    TempSrc:  Oral    SpO2: (S) (!) 81% 93%  97%  Weight:   110.2 kg   Height:   1.676 m (  )      Final Clinical Impression(s) / ED Diagnoses Final diagnoses:  Acute respiratory failure due to COVID-19 [facility])      Eber Hong, MD 03/05/20 [id number]

## 2020-03-05 NOTE — H&P (Addendum)
History and Physical    Rachel Humblesngela G Hoke MVH:[id number] DOB: [birth year] DOA: 03/05/2020  PCP: Sigmund HazelMiller, Lisa, MD   Patient coming from: Home  Chief Complaint  Patient presents with  . Shortness of Breath  . Cough  . Headache      HPI: Rachel Humblesngela G Minnis is a 65 y.o. female with medical history significant for morbid obesity with BMI 39/OSA, GERD, history of allergic rhinitis, asthma when she was younger, vaccinated for COVID-19 in April 2021, osteoarthritis/back pain, hypothyroidism presents to the ED for evaluation of worsening shortness of breath. Patient reported she was exposed to COVID-19 patient on Saturday, on Monday started to have sore throat and tested at home on Tuesday and was positive for Covid.  She has had fever up to 102.6, been having cough, progressive weakness, shortness of breath but no nausea vomiting diarrhea.  Her primary care doctor called in for Molnupiravir,flovent, she was also taking albuterol but without relief.  She came to the ED  In ED: CXR Left sided Pneumonia, Hypoxic 83% RA,labs with leukopenia 3.8, elevtated LDH 214, procal elevated 4.48, Lactic acid normal 0.7, D dimer 1.1.  Patient was given ceftriaxone, azithromycin remdesivir Decadron IV fluids and admission was requested.  Blood cultures were sent. Currently she is on 4 L oxygen saturating 97%, rest of the vitals are stable. She mainly complains of cough and sore throat and after coughing bouts she gets chest pain but none at rest. She does not appear in any distress. Her covid swab is pending in ED.  ED Course:  Vitals:   03/05/20 1530 03/05/20 1600 03/05/20 1630 03/05/20 1700  BP: 117/74 103/69 105/78 106/74  Pulse: 82 84 80 79  Resp: 18 18  18   Temp:      TempSrc:      SpO2: 97% 98% 90% 97%  Weight:      Height:         Review of Systems: All systems were reviewed and were negative except as mentioned in HPI above. Negative for fever Negative for chest pain Negative for focal  weakness  Past Medical History:  Diagnosis Date  . Allergic rhinitis   . GERD (gastroesophageal reflux disease)   . H/O bariatric surgery 10/2016  . History of hiatal hernia   . History of kidney stones   . Hypothyroidism   . Migraines   . Mild asthma   . OA (osteoarthritis)   . Recurrent upper respiratory infection (URI)   . Renal calculus, bilateral   . Wears glasses     Past Surgical History:  Procedure Laterality Date  . ANKLE ARTHROSCOPY Left 01-27-2002    [facility]  . BREAST SURGERY     lumpectomy x 2 - benign   . CARPAL TUNNEL RELEASE Bilateral right 08-23-2003   dr sypher [facility]/  left 2006 approx.  . CYSTOSCOPY WITH RETROGRADE PYELOGRAM, URETEROSCOPY AND STENT PLACEMENT Bilateral 01/20/2017   Procedure: CYSTOSCOPY WITH RETROGRADE PYELOGRAM, URETEROSCOPY AND STENT PLACEMENT;  Surgeon: Malen GauzeMcKenzie, Patrick L, MD;  [facility];  Service: Urology;  Laterality: Bilateral;  . DILATION AND CURETTAGE OF UTERUS     several   . ESOPHAGOGASTRODUODENOSCOPY  09/2016  . HOLMIUM LASER APPLICATION Bilateral 01/20/2017   Procedure: HOLMIUM LASER APPLICATION;  Surgeon: Malen GauzeMcKenzie, Patrick L, MD;  [facility];  Service: Urology;  Laterality: Bilateral;  . KNEE ARTHROSCOPY WITH MEDIAL MENISECTOMY Right 06/08/2013   Procedure: RIGHT KNEE ARTHROSCOPY WITH LATERAL MENISECTOMY with ABRASION CHRONDROPLASTY and  SUPRAPATELLAR SYNOVECTOMY;  Surgeon: Jacki Cones, MD;  Location: [facility];  Service: Orthopedics;  Laterality: Right;  . LAPAROSCOPIC CHOLECYSTECTOMY  1996  . LAPAROSCOPIC GASTRIC RESTRICTIVE DUODENAL PROCEDURE (DUODENAL SWITCH)  11-11-2016     [facility] in [place] , [place]   AND HIATAL HERNIA REPAIR  . TONSILLECTOMY  child  . TOTAL HIP ARTHROPLASTY Left 04-02-2006   dr Darrelyn Hillock   [facility]  . TOTAL KNEE ARTHROPLASTY Right 01/25/2015   Procedure: TOTAL KNEE ARTHROPLASTY;  Surgeon: Ranee Gosselin, MD;  Location: [facility];  Service: Orthopedics;  Laterality:  Right;     reports that she has never smoked. She has never used smokeless tobacco. She reports that she does not drink alcohol and does not use drugs.  Allergies  Allergen Reactions  . Bee Venom Anaphylaxis  . Codeine Itching  . Guaifenesin & Derivatives Anaphylaxis  . Molds & Smuts Shortness Of Breath    Nose will burn  . Pollen Extract     Other reaction(s): Eye Irritation Itchy Eyes, Burning Eyes, Sneezing    Family History  Problem Relation Age of Onset  . Asthma Mother   . Asthma Sister   . Urticaria Neg Hx   . Eczema Neg Hx   . Allergic rhinitis Neg Hx      Prior to Admission medications   Medication Sig Start Date End Date Taking? Authorizing Provider  acetaminophen (TYLENOL) 500 MG tablet Take 1,000 mg by mouth every 6 (six) hours as needed for mild pain.    [provider]  albuterol (PROVENTIL HFA;VENTOLIN HFA) 108 (90 Base) MCG/ACT inhaler Inhale 1 puff into the lungs every 6 (six) hours as needed for wheezing or shortness of breath. 10/13/17   Bobbitt, Heywood Iles, MD  albuterol (PROVENTIL) (2.5 MG/3ML) 0.083% nebulizer solution Inhale 3 mLs into the lungs every 6 (six) hours as needed for shortness of breath.    [provider]  calcium citrate-vitamin D (CELEBRATE CALCIUM CITRATE) 500-500 MG-UNIT chewable tablet Chew 2 tablets by mouth daily.    [provider]  docusate sodium (COLACE) 100 MG capsule Take 100 mg by mouth 2 (two) times daily.    [provider]  EPINEPHrine (EPIPEN 2-PAK) 0.3 mg/0.3 mL IJ SOAJ injection Inject 0.3 mg into the muscle as needed for anaphylaxis.     [provider]  ferrous sulfate 325 (65 FE) MG tablet Take 325 mg by mouth daily with breakfast.     [provider]  fexofenadine (ALLEGRA) 180 MG tablet Take 180 mg by mouth daily.    [provider]  fluticasone (FLONASE) 50 MCG/ACT nasal spray Place 2 sprays into both nostrils at bedtime.     [provider]   gabapentin (NEURONTIN) 300 MG capsule Take 600 mg by mouth at bedtime.  12/24/18   [provider]  HYDROcodone-acetaminophen (NORCO/VICODIN) 5-325 MG tablet Take 1-2 tablets by mouth every 6 (six) hours as needed for severe pain. 04/24/19   Joy, Shawn C, PA-C  levothyroxine (SYNTHROID, LEVOTHROID) 137 MCG tablet Take 137 mcg by mouth daily before breakfast.     [provider]  lidocaine (LIDODERM) 5 % Place 1 patch onto the skin daily. Remove & Discard patch within 12 hours or as directed by MD 04/24/19   Anselm Pancoast, PA-C  Melatonin 10 MG TABS Take 10 mg by mouth at bedtime.     [provider]  methocarbamol (ROBAXIN) 500 MG tablet Take 1 tablet (500 mg total) by mouth 2 (two)  times daily. 04/24/19   Joy, Shawn C, PA-C  Multiple Vitamin (MULTIVITAMIN) tablet Take 1 tablet by mouth daily. Celebrity nuitriional supplement multiple vitaman complete chews    [provider]  oxyCODONE-acetaminophen (PERCOCET) 5-325 MG tablet Take 1 tablet by mouth every 4 (four) hours as needed for severe pain. 04/26/19   Milagros Loll, MD    Physical Exam: Vitals:   03/05/20 1530 03/05/20 1600 03/05/20 1630 03/05/20 1700  BP: 117/74 103/69 105/78 106/74  Pulse: 82 84 80 79  Resp: 18 18  18   Temp:      TempSrc:      SpO2: 97% 98% 90% 97%  Weight:      Height:        General exam: AAOx3, obese,on ,NAD, weak appearing. HEENT:Oral mucosa moist, Ear/Nose WNL grossly, dentition normal. Respiratory system: diminished more on left lung no wheezing or crackles,no use of accessory muscle Cardiovascular system: S1 & S2 +, No JVD,. Gastrointestinal system: Abdomen soft, NT,ND, BS+ Nervous System:Alert, awake, moving extremities and grossly nonfocal Extremities: No edema, distal peripheral pulses palpable.  Skin: No rashes,no icterus. MSK: Normal muscle bulk,tone, power   Labs on Admission: I have personally reviewed following labs and imaging studies  CBC: Recent Labs   Lab 03/05/20 1619  WBC 3.8*  NEUTROABS 2.5  HGB 12.6  HCT 40.7  MCV 96.0  PLT 126*   Basic Metabolic Panel: Recent Labs  Lab 03/05/20 1619  NA 140  K 4.6  CL 108  CO2 23  GLUCOSE 103*  BUN 28*  CREATININE 1.06*  CALCIUM 8.9   GFR: Estimated Creatinine Clearance: 69.2 mL/min (A) (by C-G formula based on SCr of 1.06 mg/dL (H)). Liver Function Tests: Recent Labs  Lab 03/05/20 1619  AST 40  ALT 25  ALKPHOS 54  BILITOT 0.8  PROT 6.6  ALBUMIN 3.1*   No results for input(s): LIPASE, AMYLASE in the last 168 hours. No results for input(s): AMMONIA in the last 168 hours. Coagulation Profile: Recent Labs  Lab 03/05/20 1619  INR 1.0   Cardiac Enzymes: No results for input(s): CKTOTAL, CKMB, CKMBINDEX, TROPONINI in the last 168 hours. BNP (last 3 results) No results for input(s): PROBNP in the last 8760 hours. HbA1C: No results for input(s): HGBA1C in the last 72 hours. CBG: No results for input(s): GLUCAP in the last 168 hours. Lipid Profile: Recent Labs    03/05/20 1619  TRIG 37   Thyroid Function Tests: No results for input(s): TSH, T4TOTAL, FREET4, T3FREE, THYROIDAB in the last 72 hours. Anemia Panel: Recent Labs    03/05/20 1619  FERRITIN 729*   Urine analysis:    Component Value Date/Time   COLORURINE YELLOW 04/26/2019 0957   APPEARANCEUR HAZY (A) 04/26/2019 0957   LABSPEC 1.030 04/26/2019 0957   PHURINE 5.0 04/26/2019 0957   GLUCOSEU NEGATIVE 04/26/2019 0957   HGBUR NEGATIVE 04/26/2019 0957   BILIRUBINUR NEGATIVE 04/26/2019 0957   KETONESUR NEGATIVE 04/26/2019 0957   PROTEINUR 30 (A) 04/26/2019 0957   NITRITE NEGATIVE 04/26/2019 0957   LEUKOCYTESUR LARGE (A) 04/26/2019 0957    Radiological Exams on Admission: DG Chest Port 1 View  Result Date: 03/05/2020 CLINICAL DATA:  COVID-19 positive with cough and shortness of breath EXAM: PORTABLE CHEST 1 VIEW COMPARISON:  January 12, 2015 FINDINGS: There is airspace opacity throughout much of the  left lung. Right lung is clear. Heart size and pulmonary vascularity are normal. No adenopathy. No bone lesions. IMPRESSION: Airspace opacity consistent with pneumonia throughout much  of the left lung. Suspect multifocal pneumonia, likely of atypical organism etiology. Right lung clear. Cardiac silhouette within normal limits. Electronically Signed   By: Bretta Bang III M.D.   On: 03/05/2020 14:10    Assessment/Plan  Multifocal pneumonia mostly on the left side suspecting combination of bacterial pneumonia and also with Covid positive at home- covid pneumonia:Chest x-ray showing mostly left-sided pneumonia, labs with elevated LDH leukopenia, COVID-19 PCR pending from ED.  We will continue on antibiotic coverage for community-acquired pneumonia with ceftriaxone/erythromycin given elevated procalcitonin, lactic acid is normal.  We will continue with COVID-19 treatment with remdesivir/IV Solu-Medrol.  Acute hypoxic respiratory failure due to #1 with pneumonia/Covid infection: Not in respiratory distress.  Continue supplemental oxygen intermittent saturation at least 94%.  Osteoarthritis of right knee/chronic back pain: Symptomatic management.  Hypothyroidism: Resume home Synthroid dose.  Allergic rhinitis/history of asthma as a kid, controlled with nebs/rescue inhaler at home:Continue respiratory support as #1.  Morbid obesity with BMI 39/OSA hx-not on cpap: Counseling on weight loss strategy outpatient follow-up once medically stable. Does not use cpap at home.   GERD: cont ppi  Vaccinated for COVID-19 in April 2021 Body mass index is 39.22 kg/m.   Severity of Illness:  * I certify that at the point of admission it is my clinical judgment that the patient will require inpatient hospital care spanning beyond 2 midnights from the point of admission due to high intensity of service, high risk for further deterioration and high frequency of surveillance required for pneumonia and hypoxia     DVT prophylaxis: Lovenox Code Status:   Code Status: Full Code as per her wishes. Family Communication: Admission, patients condition and plan of care including tests being ordered have been discussed with the patient who indicate understanding and agree with the plan and Code Status.  Consults called:  None Lanae Boast MD [facility]  If 7PM-7AM, please contact night-coverage [email or web address]  03/05/2020, 6:08 PM

## 2020-03-05 NOTE — Progress Notes (Signed)
elink tracking sepsis 

## 2020-03-05 NOTE — Plan of Care (Signed)

## 2020-03-06 LAB — EXPECTORATED SPUTUM ASSESSMENT W GRAM STAIN, RFLX TO RESP C

## 2020-03-06 LAB — CBC WITH DIFFERENTIAL/PLATELET
Abs Immature Granulocytes: 0.07 10*3/uL (ref 0.00–0.07)
Basophils Absolute: 0 10*3/uL (ref 0.0–0.1)
Basophils Relative: 0 %
Eosinophils Absolute: 0.1 10*3/uL (ref 0.0–0.5)
Eosinophils Relative: 2 %
HCT: 38.2 % (ref 36.0–46.0)
Hemoglobin: 12.1 g/dL (ref 12.0–15.0)
Immature Granulocytes: 2 %
Lymphocytes Relative: 27 %
Lymphs Abs: 1 10*3/uL (ref 0.7–4.0)
MCH: 30.6 pg (ref 26.0–34.0)
MCHC: 31.7 g/dL (ref 30.0–36.0)
MCV: 96.5 fL (ref 80.0–100.0)
Monocytes Absolute: 0.4 10*3/uL (ref 0.1–1.0)
Monocytes Relative: 10 %
Neutro Abs: 2.2 10*3/uL (ref 1.7–7.7)
Neutrophils Relative %: 59 %
Platelets: 135 10*3/uL — ABNORMAL LOW (ref 150–400)
RBC: 3.96 MIL/uL (ref 3.87–5.11)
RDW: 14.2 % (ref 11.5–15.5)
WBC: 3.7 10*3/uL — ABNORMAL LOW (ref 4.0–10.5)
nRBC: 0 % (ref 0.0–0.2)

## 2020-03-06 LAB — COMPREHENSIVE METABOLIC PANEL
ALT: 23 U/L (ref 0–44)
AST: 40 U/L (ref 15–41)
Albumin: 2.7 g/dL — ABNORMAL LOW (ref 3.5–5.0)
Alkaline Phosphatase: 52 U/L (ref 38–126)
Anion gap: 10 (ref 5–15)
BUN: 19 mg/dL (ref 8–23)
CO2: 25 mmol/L (ref 22–32)
Calcium: 8.7 mg/dL — ABNORMAL LOW (ref 8.9–10.3)
Chloride: 107 mmol/L (ref 98–111)
Creatinine, Ser: 0.85 mg/dL (ref 0.44–1.00)
GFR, Estimated: >60 mL/min (ref >60)
Glucose, Bld: 89 mg/dL (ref 70–99)
Potassium: 4.3 mmol/L (ref 3.5–5.1)
Sodium: 142 mmol/L (ref 135–145)
Total Bilirubin: 0.6 mg/dL (ref 0.3–1.2)
Total Protein: 6.2 g/dL — ABNORMAL LOW (ref 6.5–8.1)

## 2020-03-06 LAB — URINALYSIS, ROUTINE W REFLEX MICROSCOPIC
Bilirubin Urine: NEGATIVE
Glucose, UA: NEGATIVE mg/dL
Hgb urine dipstick: NEGATIVE
Ketones, ur: NEGATIVE mg/dL
Leukocytes,Ua: NEGATIVE
Nitrite: NEGATIVE
Protein, ur: NEGATIVE mg/dL
Specific Gravity, Urine: 1.006 (ref 1.005–1.030)
pH: 5 (ref 5.0–8.0)

## 2020-03-06 LAB — PROCALCITONIN: Procalcitonin: 3.23 ng/mL

## 2020-03-06 LAB — HIV ANTIBODY (ROUTINE TESTING W REFLEX): HIV Screen 4th Generation wRfx: NONREACTIVE

## 2020-03-06 LAB — C-REACTIVE PROTEIN: CRP: 12.5 mg/dL — ABNORMAL HIGH (ref <1.0)

## 2020-03-06 LAB — D-DIMER, QUANTITATIVE: D-Dimer, Quant: 1.1 ug/mL-FEU — ABNORMAL HIGH (ref 0.00–0.50)

## 2020-03-06 MED ORDER — CALCIUM CARBONATE-VITAMIN D 500-200 MG-UNIT PO TABS
2.0000 | ORAL_TABLET | Freq: Every day | ORAL | Status: DC
  Administered 2020-03-06 – 2020-03-11 (×6): 2 via ORAL
  Filled 2020-03-06 (×7): qty 2

## 2020-03-06 MED ORDER — METHYLPREDNISOLONE SODIUM SUCC 125 MG IJ SOLR
60.0000 mg | Freq: Three times a day (TID) | INTRAMUSCULAR | Status: AC
  Administered 2020-03-06 – 2020-03-09 (×9): 60 mg via INTRAVENOUS
  Filled 2020-03-06 (×9): qty 2

## 2020-03-06 MED ORDER — TRAZODONE HCL 50 MG PO TABS
50.0000 mg | ORAL_TABLET | Freq: Every evening | ORAL | Status: DC | PRN
  Administered 2020-03-06 – 2020-03-10 (×5): 50 mg via ORAL
  Filled 2020-03-06 (×5): qty 1

## 2020-03-06 MED ORDER — METHYLPREDNISOLONE SODIUM SUCC 125 MG IJ SOLR: 60.0000 mg | Freq: Two times a day (BID) | INTRAMUSCULAR | Status: DC

## 2020-03-06 MED ORDER — DICLOFENAC SODIUM 1 % EX GEL
2.0000 g | Freq: Four times a day (QID) | CUTANEOUS | Status: DC | PRN
  Administered 2020-03-06 – 2020-03-07 (×2): 2 g via TOPICAL
  Filled 2020-03-06: qty 100

## 2020-03-06 MED ORDER — METHYLPREDNISOLONE SODIUM SUCC 125 MG IJ SOLR
60.0000 mg | Freq: Two times a day (BID) | INTRAMUSCULAR | Status: DC
  Administered 2020-03-09 – 2020-03-11 (×4): 60 mg via INTRAVENOUS
  Filled 2020-03-06 (×4): qty 2

## 2020-03-06 MED ORDER — METHYLPREDNISOLONE SODIUM SUCC 125 MG IJ SOLR: 60.0000 mg | Freq: Four times a day (QID) | INTRAMUSCULAR | Status: DC

## 2020-03-06 NOTE — Plan of Care (Signed)

## 2020-03-06 NOTE — Progress Notes (Signed)
PROGRESS NOTE    Rachel [name]  RTM:[id number] DOB: Sep 16, 1955 DOA: 03/05/2020 PCP: Rachel Hazel, MD   Chief Complaint  Patient presents with  . Shortness of Breath  . Cough  . Headache   Brief Narrative Rachel [name] is Rachel [name] 65 y.o. female with medical history significant for morbid obesity with BMI 39/OSA, GERD, history of allergic rhinitis, asthma when she was younger, vaccinated for COVID-19 in April 2021, osteoarthritis/back pain, hypothyroidism presents to the ED for evaluation of worsening shortness of breath. Patient reported she was exposed to COVID-19 patient on Saturday, on Monday started to have sore throat and tested at home on Tuesday and was positive for Covid.  She has had fever up to 102.6, been having cough, progressive weakness, shortness of breath but no nausea vomiting diarrhea.  Her primary care doctor called in for Molnupiravir,flovent, she was also taking albuterol but without relief.  She came to the ED  She's been admitted for acute hypoxic respiratory failure 2/2 covid 19 pneumonia.  Assessment & Plan:   Principal Problem:   Multifocal pneumonia Active Problems:   Osteoarthritis of right knee   Allergic rhinitis   Pneumonia   COVID-19  Acute Hypoxic Respiratory Failure 2/2 COVID 19 Pneumonia  Concern for Superimposed Bacterial Pneumonia 4 L [place] CXR 2/13 with airspace opacity throughout much of L lung, right lung clear.  Multifocal pneumonia.   Continue steroids, remdesivir Continue ceftriaxone, azithromycin Procal is elevated, trend - improving Urine strep, legionella, sputum cx Follow blood cx Strict I/O, daily weights OOB, IS, flutter, prone as able, therapy  COVID-19 Labs  Recent Labs    03/05/20 1619 03/06/20 0128  DDIMER 1.10* 1.10*  FERRITIN 729*  --   LDH 214*  --   CRP 13.3* 12.5*    Lab Results  Component Value Date   SARSCOV2NAA POSITIVE (Rachel [name]) 03/05/2020   Osteoarthritis of right knee/chronic back pain: Symptomatic  management.  Hypothyroidism: Resume home Synthroid dose.  Allergic rhinitis/history of asthma as Rachel [name] kid, controlled with nebs/rescue inhaler at home:Continue respiratory support as #1.  Morbid obesity with BMI 39/OSA hx-not on cpap: Counseling on weight loss strategy outpatient follow-up once medically stable. Does not use cpap at home.   GERD: cont ppi  Vaccinated for COVID-19 in April 2021 Body mass index is 39.22 kg/m.   DVT prophylaxis: lovenox Code Status: full Family Communication: none at bedside Disposition:   Status is: Inpatient  Remains inpatient appropriate because:Inpatient level of care appropriate due to severity of illness   Dispo: The patient is from: Home              Anticipated d/c is to: Home              Anticipated d/c date is: > 3 days              Patient currently is not medically stable to d/c.   Difficult to place patient No       Consultants:   None   Procedures:  none  Antimicrobials: Anti-infectives (From admission, onward)   Start     Dose/Rate Route Frequency Ordered Stop   03/06/20 1000  remdesivir 100 mg in sodium chloride 0.9 % 100 mL IVPB        100 mg 200 mL/hr over 30 Minutes Intravenous Daily 03/05/20 1727 03/10/20 0959   03/05/20 1830  remdesivir 200 mg in sodium chloride 0.9% 250 mL IVPB        200 mg 580 mL/hr over  30 Minutes Intravenous Once 03/05/20 1725 03/05/20 2031   03/05/20 1530  cefTRIAXone (ROCEPHIN) 2 g in sodium chloride 0.9 % 100 mL IVPB        2 g 200 mL/hr over 30 Minutes Intravenous Every 24 hours 03/05/20 1527 03/10/20 1529   03/05/20 1530  azithromycin (ZITHROMAX) 500 mg in sodium chloride 0.9 % 250 mL IVPB        500 mg 250 mL/hr over 60 Minutes Intravenous Every 24 hours 03/05/20 1527 03/10/20 1529         Subjective: Feeling ok now No new complaints  Objective: Vitals:   03/06/20 0047 03/06/20 0458 03/06/20 0848 03/06/20 1253  BP: 116/77 113/63 120/68 129/64  Pulse: (!) 101 85 99 90   Resp: 20 18 (!) 22 20  Temp: 98.8 F (37.1 C) 98.8 F (37.1 C) 98.3 F (36.8 C) 97.7 F (36.5 C)  TempSrc: Oral Oral Oral Oral  SpO2: (!) 87% 91% 90% 95%  Weight:      Height:        Intake/Output Summary (Last 24 hours) at 03/06/2020 1645 Last data filed at 03/06/2020 1500 Gross per 24 hour  Intake 1136.24 ml  Output --  Net 1136.24 ml   Filed Weights   03/05/20 1324 03/05/20 2110  Weight: 110.2 kg 109.5 kg    Examination:  General exam: Appears calm and comfortable.  Obese. Respiratory system: Clear to auscultation. Respiratory effort normal.  On 4 L [place]. Cardiovascular system: S1 & S2 heard, RRR.  Gastrointestinal system: Abdomen is nondistended, soft and nontender.  Central nervous system: Alert and oriented. No focal neurological deficits. Extremities: no lee Skin: No rashes, lesions or ulcers Psychiatry: Judgement and insight appear normal. Mood & affect appropriate.     Data Reviewed: I have personally reviewed following labs and imaging studies  CBC: Recent Labs  Lab 03/05/20 1619 03/06/20 0128  WBC 3.8* 3.7*  NEUTROABS 2.5 2.2  HGB 12.6 12.1  HCT 40.7 38.2  MCV 96.0 96.5  PLT 126* 135*    Basic Metabolic Panel: Recent Labs  Lab 03/05/20 1619 03/06/20 0128  NA 140 142  K 4.6 4.3  CL 108 107  CO2 23 25  GLUCOSE 103* 89  BUN 28* 19  CREATININE 1.06* 0.85  CALCIUM 8.9 8.7*    GFR: Estimated Creatinine Clearance: 86 mL/min (by C-G formula based on SCr of 0.85 mg/dL).  Liver Function Tests: Recent Labs  Lab 03/05/20 1619 03/06/20 0128  AST 40 40  ALT 25 23  ALKPHOS 54 52  BILITOT 0.8 0.6  PROT 6.6 6.2*  ALBUMIN 3.1* 2.7*    CBG: No results for input(s): GLUCAP in the last 168 hours.   Recent Results (from the past 240 hour(s))  Resp Panel by RT-PCR (Flu Rachel [name]&B, Covid) Nasopharyngeal Swab     Status: Abnormal   Collection Time: 03/05/20  4:14 PM   Specimen: Nasopharyngeal Swab; Nasopharyngeal(NP) swabs in vial transport medium   Result Value Ref Range Status   SARS Coronavirus 2 by RT PCR POSITIVE (Rachel [name]) NEGATIVE Final    Comment: RESULT CALLED TO, READ BACK BY AND VERIFIED WITH: BOWEN,M RN @1748  ON 03/05/20 Rachel [name] (NOTE) SARS-CoV-2 target nucleic acids are DETECTED.  The SARS-CoV-2 RNA is generally detectable in upper respiratory specimens during the acute phase of infection. Positive results are indicative of the presence of the identified virus, but do not rule out bacterial infection or co-infection with other pathogens not detected by the test. Clinical correlation with patient history  and other diagnostic information is necessary to determine patient infection status. The expected result is Negative.  Fact Sheet for Patients: BloggerCourse.com  Fact Sheet for Healthcare Providers: SeriousBroker.it  This test is not yet approved or cleared by the [place] FDA and  has been authorized for detection and/or diagnosis of SARS-CoV-2 by FDA under an Emergency Use Authorization (EUA).  This EUA will remain in effect (meaning this test ca n be used) for the duration of  the COVID-19 declaration under Section 564(b)(1) of the Act, 21 U.S.C. section 360bbb-3(b)(1), unless the authorization is terminated or revoked sooner.     Influenza Draken Farrior by PCR NEGATIVE NEGATIVE Final   Influenza B by PCR NEGATIVE NEGATIVE Final    Comment: (NOTE) The Xpert Xpress SARS-CoV-2/FLU/RSV plus assay is intended as an aid in the diagnosis of influenza from Nasopharyngeal swab specimens and should not be used as Darah Simkin sole basis for treatment. Nasal washings and aspirates are unacceptable for Xpert Xpress SARS-CoV-2/FLU/RSV testing.  Fact Sheet for Patients: BloggerCourse.com  Fact Sheet for Healthcare Providers: SeriousBroker.it  This test is not yet approved or cleared by the [place] FDA and has been authorized for  detection and/or diagnosis of SARS-CoV-2 by FDA under an Emergency Use Authorization (EUA). This EUA will remain in effect (meaning this test can be used) for the duration of the COVID-19 declaration under Section 564(b)(1) of the Act, 21 U.S.C. section 360bbb-3(b)(1), unless the authorization is terminated or revoked.  Performed at [facility], [address], [place], [place] [address]   Blood Culture (routine x 2)     Status: None (Preliminary result)   Collection Time: 03/05/20  4:19 PM   Specimen: BLOOD  Result Value Ref Range Status   Specimen Description   Final    BLOOD RIGHT ANTECUBITAL Performed at [facility], [address], [place], [place] [address]    Special Requests   Final    BOTTLES DRAWN AEROBIC AND ANAEROBIC Blood Culture adequate volume Performed at [facility], [address], [place], [place] [address]    Culture   Final    NO GROWTH < 24 HOURS Performed at [facility], [address], [place], [place] [address]    Report Status PENDING  Incomplete  Blood Culture (routine x 2)     Status: None (Preliminary result)   Collection Time: 03/05/20  5:05 PM   Specimen: BLOOD  Result Value Ref Range Status   Specimen Description   Final    BLOOD LEFT ANTECUBITAL Performed at [facility], [address], [place], [place] [address]    Special Requests   Final    BOTTLES DRAWN AEROBIC AND ANAEROBIC Blood Culture adequate volume Performed at [facility], [address] [place], [place] [address]    Culture   Final    NO GROWTH < 24 HOURS Performed at [facility], [address], [place], [place] [address]    Report Status PENDING  Incomplete         Radiology Studies: DG Chest Port 1 View  Result Date: 03/05/2020 CLINICAL DATA:  COVID-19 positive with cough and shortness of breath EXAM: PORTABLE CHEST 1 VIEW COMPARISON:  January 12, 2015  FINDINGS: There is airspace opacity throughout much of the left lung. Right lung is clear. Heart size and pulmonary vascularity are normal. No adenopathy. No bone lesions. IMPRESSION: Airspace opacity consistent with pneumonia throughout much of the left lung.  Suspect multifocal pneumonia, likely of atypical organism etiology. Right lung clear. Cardiac silhouette within normal limits. Electronically Signed   By: Bretta Bang III M.D.   On: 03/05/2020 14:10        Scheduled Meds: . vitamin C  500 mg Oral BID  . calcium-vitamin D  2 tablet Oral Q breakfast  . enoxaparin (LOVENOX) injection  60 mg Subcutaneous Q24H  . fluticasone  2 spray Each Nare QHS  . gabapentin  600 mg Oral QHS  . ipratropium  2 puff Inhalation Q6H  . levothyroxine  137 mcg Oral QAC breakfast  . loratadine  10 mg Oral Daily  . methylPREDNISolone (SOLU-MEDROL) injection  62.5 mg Intravenous Q12H   Followed by  . [START ON 03/09/2020] predniSONE  50 mg Oral Daily  . pantoprazole  40 mg Oral Daily  . zinc sulfate  220 mg Oral Daily   Continuous Infusions: . azithromycin Stopped (03/05/20 1724)  . cefTRIAXone (ROCEPHIN)  IV Stopped (03/05/20 1724)  . remdesivir 100 mg in NS 100 mL 100 mg (03/06/20 1128)     LOS: 1 day    Time spent: over 30 min     Lacretia Nicks, MD [facility]   To contact the attending provider between 7A-7P or the covering provider during after hours 7P-7A, please log into the web site [email or web address] and access using universal [place] password for that web site. If you do not have the password, please call the hospital operator.  03/06/2020, 4:45 PM

## 2020-03-07 LAB — COMPREHENSIVE METABOLIC PANEL
ALT: 23 U/L (ref 0–44)
AST: 46 U/L — ABNORMAL HIGH (ref 15–41)
Albumin: 2.8 g/dL — ABNORMAL LOW (ref 3.5–5.0)
Alkaline Phosphatase: 51 U/L (ref 38–126)
Anion gap: 10 (ref 5–15)
BUN: 14 mg/dL (ref 8–23)
CO2: 23 mmol/L (ref 22–32)
Calcium: 9.1 mg/dL (ref 8.9–10.3)
Chloride: 109 mmol/L (ref 98–111)
Creatinine, Ser: 0.83 mg/dL (ref 0.44–1.00)
GFR, Estimated: >60 mL/min (ref >60)
Glucose, Bld: 142 mg/dL — ABNORMAL HIGH (ref 70–99)
Potassium: 5 mmol/L (ref 3.5–5.1)
Sodium: 142 mmol/L (ref 135–145)
Total Bilirubin: 0.5 mg/dL (ref 0.3–1.2)
Total Protein: 6.6 g/dL (ref 6.5–8.1)

## 2020-03-07 LAB — CBC WITH DIFFERENTIAL/PLATELET
Abs Immature Granulocytes: 0.1 10*3/uL — ABNORMAL HIGH (ref 0.00–0.07)
Basophils Absolute: 0 10*3/uL (ref 0.0–0.1)
Basophils Relative: 1 %
Eosinophils Absolute: 0 10*3/uL (ref 0.0–0.5)
Eosinophils Relative: 0 %
HCT: 41.4 % (ref 36.0–46.0)
Hemoglobin: 12.7 g/dL (ref 12.0–15.0)
Immature Granulocytes: 3 %
Lymphocytes Relative: 23 %
Lymphs Abs: 0.7 10*3/uL (ref 0.7–4.0)
MCH: 29.5 pg (ref 26.0–34.0)
MCHC: 30.7 g/dL (ref 30.0–36.0)
MCV: 96.3 fL (ref 80.0–100.0)
Monocytes Absolute: 0.3 10*3/uL (ref 0.1–1.0)
Monocytes Relative: 8 %
Neutro Abs: 2 10*3/uL (ref 1.7–7.7)
Neutrophils Relative %: 65 %
Platelets: 132 10*3/uL — ABNORMAL LOW (ref 150–400)
RBC: 4.3 MIL/uL (ref 3.87–5.11)
RDW: 13.9 % (ref 11.5–15.5)
WBC: 3.1 10*3/uL — ABNORMAL LOW (ref 4.0–10.5)
nRBC: 0 % (ref 0.0–0.2)

## 2020-03-07 LAB — LEGIONELLA PNEUMOPHILA SEROGP 1 UR AG: L. pneumophila Serogp 1 Ur Ag: NEGATIVE

## 2020-03-07 LAB — D-DIMER, QUANTITATIVE: D-Dimer, Quant: 0.73 ug/mL-FEU — ABNORMAL HIGH (ref 0.00–0.50)

## 2020-03-07 LAB — STREP PNEUMONIAE URINARY ANTIGEN: Strep Pneumo Urinary Antigen: NEGATIVE

## 2020-03-07 LAB — GLUCOSE, CAPILLARY: Glucose-Capillary: 132 mg/dL — ABNORMAL HIGH (ref 70–99)

## 2020-03-07 LAB — C-REACTIVE PROTEIN: CRP: 12.3 mg/dL — ABNORMAL HIGH (ref <1.0)

## 2020-03-07 LAB — PROCALCITONIN: Procalcitonin: 1.53 ng/mL

## 2020-03-07 MED ORDER — NYSTATIN 100000 UNIT/GM EX POWD
Freq: Three times a day (TID) | CUTANEOUS | Status: DC
  Filled 2020-03-07: qty 15

## 2020-03-07 MED ORDER — INSULIN ASPART 100 UNIT/ML ~~LOC~~ SOLN
0.0000 [IU] | Freq: Three times a day (TID) | SUBCUTANEOUS | Status: DC
  Administered 2020-03-08: 2 [IU] via SUBCUTANEOUS
  Administered 2020-03-08 – 2020-03-09 (×3): 1 [IU] via SUBCUTANEOUS
  Administered 2020-03-09: 2 [IU] via SUBCUTANEOUS
  Administered 2020-03-10 (×3): 1 [IU] via SUBCUTANEOUS

## 2020-03-07 MED ORDER — NYSTATIN 100000 UNIT/ML MT SUSP
5.0000 mL | Freq: Four times a day (QID) | OROMUCOSAL | Status: DC
  Administered 2020-03-07 – 2020-03-11 (×15): 500000 [IU] via ORAL
  Filled 2020-03-07 (×15): qty 5

## 2020-03-07 NOTE — Progress Notes (Signed)
Patient arrives to room 1529 at this time from 4W via wheelchair.  Patient independent to bed from wheelchair.  Patient noted to have persistent dry cough at this time

## 2020-03-07 NOTE — Progress Notes (Signed)
Attempted to call report to 5W, was informed nurse would call back

## 2020-03-07 NOTE — Plan of Care (Signed)

## 2020-03-07 NOTE — Progress Notes (Signed)
PROGRESS NOTE    Rachel [name]  JFH:[id number] DOB: 01-01-1956 DOA: 03/05/2020 PCP: Sigmund Hazel, MD   Chief Complaint  Patient presents with  . Shortness of Breath  . Cough  . Headache   Brief Narrative Rachel [name] is Rachel [name] 65 y.o. female with medical history significant for morbid obesity with BMI 39/OSA, GERD, history of allergic rhinitis, asthma when she was younger, vaccinated for COVID-19 in April 2021, osteoarthritis/back pain, hypothyroidism presents to the ED for evaluation of worsening shortness of breath. Patient reported she was exposed to COVID-19 patient on Saturday, on Monday started to have sore throat and tested at home on Tuesday and was positive for Covid.  She has had fever up to 102.6, been having cough, progressive weakness, shortness of breath but no nausea vomiting diarrhea.  Her primary care doctor called in for Molnupiravir,flovent, she was also taking albuterol but without relief.  She came to the ED  She's been admitted for acute hypoxic respiratory failure 2/2 covid 19 pneumonia.  Assessment & Plan:   Principal Problem:   Multifocal pneumonia Active Problems:   Osteoarthritis of right knee   Allergic rhinitis   Pneumonia   COVID-19  Acute Hypoxic Respiratory Failure 2/2 COVID 19 Pneumonia  Concern for Superimposed Bacterial Pneumonia Currently on 4 L [place] CXR 2/13 with airspace opacity throughout much of L lung, right lung clear.  Multifocal pneumonia.   Continue steroids, remdesivir Continue ceftriaxone, azithromycin Procal is elevated, trend - improving CRP elevated Urine strep (negative), legionella (negative), sputum cx - too young to read (ambundant gram positive cocci, yeast, few gram positive rods, rare gram negative rods) Follow blood cx Strict I/O, daily weights OOB, IS, flutter, prone as able, therapy  COVID-19 Labs  Recent Labs    03/05/20 1619 03/06/20 0128 03/07/20 0349  DDIMER 1.10* 1.10* 0.73*  FERRITIN 729*  --   --   LDH  214*  --   --   CRP 13.3* 12.5* 12.3*    Lab Results  Component Value Date   SARSCOV2NAA POSITIVE (Mickel Schreur) 03/05/2020   Hyperglycemia: follow SSI, A1c  Osteoarthritis of right knee/chronic back pain: Symptomatic management.  Hypothyroidism: Resume home Synthroid dose.  Allergic rhinitis/history of asthma as Gladys Deckard kid, controlled with nebs/rescue inhaler at home:Continue respiratory support as #1.  Morbid obesity with BMI 39/OSA hx-not on cpap: Counseling on weight loss strategy outpatient follow-up once medically stable. Does not use cpap at home.   GERD: cont ppi  Vaccinated for COVID-19 in April 2021 Body mass index is 39.22 kg/m.   DVT prophylaxis: lovenox Code Status: full Family Communication: none at bedside Disposition:   Status is: Inpatient  Remains inpatient appropriate because:Inpatient level of care appropriate due to severity of illness   Dispo: The patient is from: Home              Anticipated d/c is to: Home              Anticipated d/c date is: > 3 days              Patient currently is not medically stable to d/c.   Difficult to place patient No       Consultants:   None   Procedures:  none  Antimicrobials: Anti-infectives (From admission, onward)   Start     Dose/Rate Route Frequency Ordered Stop   03/06/20 1000  remdesivir 100 mg in sodium chloride 0.9 % 100 mL IVPB        100  mg 200 mL/hr over 30 Minutes Intravenous Daily 03/05/20 1727 03/10/20 0959   03/05/20 1830  remdesivir 200 mg in sodium chloride 0.9% 250 mL IVPB        200 mg 580 mL/hr over 30 Minutes Intravenous Once 03/05/20 1725 03/05/20 2031   03/05/20 1530  cefTRIAXone (ROCEPHIN) 2 g in sodium chloride 0.9 % 100 mL IVPB        2 g 200 mL/hr over 30 Minutes Intravenous Every 24 hours 03/05/20 1527 03/10/20 1529   03/05/20 1530  azithromycin (ZITHROMAX) 500 mg in sodium chloride 0.9 % 250 mL IVPB        500 mg 250 mL/hr over 60 Minutes Intravenous Every 24 hours 03/05/20 1527  03/10/20 1529         Subjective: No new complaints  Objective: Vitals:   03/06/20 2121 03/07/20 0647 03/07/20 0927 03/07/20 1527  BP: 107/67 (!) 157/81 125/75 129/81  Pulse: 80 78 90 98  Resp: 20 19 (!) 24 20  Temp: 98.1 F (36.7 C) 97.9 F (36.6 C) 97.9 F (36.6 C) 98.1 F (36.7 C)  TempSrc: Oral Oral Oral Oral  SpO2: 93% 91% 92% 97%  Weight:  109.2 kg    Height:        Intake/Output Summary (Last 24 hours) at 03/07/2020 2030 Last data filed at 03/07/2020 1526 Gross per 24 hour  Intake 1182 ml  Output --  Net 1182 ml   Filed Weights   03/05/20 1324 03/05/20 2110 03/07/20 0647  Weight: 110.2 kg 109.5 kg 109.2 kg    Examination:  General: No acute distress. obese Cardiovascular: Heart sounds show Rachel [name] regular rate, and rhythm.  Lungs: no clear adventitious lung sounds Abdomen: Soft, nontender, nondistended  Neurological: Alert and oriented 3. Moves all extremities 4. Cranial nerves II through XII grossly intact. Skin: Warm and dry. No rashes or lesions. Extremities: No clubbing or cyanosis. No edema.   Data Reviewed: I have personally reviewed following labs and imaging studies  CBC: Recent Labs  Lab 03/05/20 1619 03/06/20 0128 03/07/20 0349  WBC 3.8* 3.7* 3.1*  NEUTROABS 2.5 2.2 2.0  HGB 12.6 12.1 12.7  HCT 40.7 38.2 41.4  MCV 96.0 96.5 96.3  PLT 126* 135* 132*    Basic Metabolic Panel: Recent Labs  Lab 03/05/20 1619 03/06/20 0128 03/07/20 0349  NA 140 142 142  K 4.6 4.3 5.0  CL 108 107 109  CO2 23 25 23   GLUCOSE 103* 89 142*  BUN 28* 19 14  CREATININE 1.06* 0.85 0.83  CALCIUM 8.9 8.7* 9.1    GFR: Estimated Creatinine Clearance: 88 mL/min (by C-G formula based on SCr of 0.83 mg/dL).  Liver Function Tests: Recent Labs  Lab 03/05/20 1619 03/06/20 0128 03/07/20 0349  AST 40 40 46*  ALT 25 23 23   ALKPHOS 54 52 51  BILITOT 0.8 0.6 0.5  PROT 6.6 6.2* 6.6  ALBUMIN 3.1* 2.7* 2.8*    CBG: No results for input(s): GLUCAP in the  last 168 hours.   Recent Results (from the past 240 hour(s))  Resp Panel by RT-PCR (Flu Belton Peplinski&B, Covid) Nasopharyngeal Swab     Status: Abnormal   Collection Time: 03/05/20  4:14 PM   Specimen: Nasopharyngeal Swab; Nasopharyngeal(NP) swabs in vial transport medium  Result Value Ref Range Status   SARS Coronavirus 2 by RT PCR POSITIVE (Jaikob Borgwardt) NEGATIVE Final    Comment: RESULT CALLED TO, READ BACK BY AND VERIFIED WITH: BOWEN,M RN @1748  ON 03/05/20 JACKSON,K (NOTE) SARS-CoV-2 target  nucleic acids are DETECTED.  The SARS-CoV-2 RNA is generally detectable in upper respiratory specimens during the acute phase of infection. Positive results are indicative of the presence of the identified virus, but do not rule out bacterial infection or co-infection with other pathogens not detected by the test. Clinical correlation with patient history and other diagnostic information is necessary to determine patient infection status. The expected result is Negative.  Fact Sheet for Patients: BloggerCourse.com  Fact Sheet for Healthcare Providers: SeriousBroker.it  This test is not yet approved or cleared by the [place] FDA and  has been authorized for detection and/or diagnosis of SARS-CoV-2 by FDA under an Emergency Use Authorization (EUA).  This EUA will remain in effect (meaning this test ca n be used) for the duration of  the COVID-19 declaration under Section 564(b)(1) of the Act, 21 U.S.C. section 360bbb-3(b)(1), unless the authorization is terminated or revoked sooner.     Influenza Seri Kimmer by PCR NEGATIVE NEGATIVE Final   Influenza B by PCR NEGATIVE NEGATIVE Final    Comment: (NOTE) The Xpert Xpress SARS-CoV-2/FLU/RSV plus assay is intended as an aid in the diagnosis of influenza from Nasopharyngeal swab specimens and should not be used as Nimai Burbach sole basis for treatment. Nasal washings and aspirates are unacceptable for Xpert Xpress  SARS-CoV-2/FLU/RSV testing.  Fact Sheet for Patients: BloggerCourse.com  Fact Sheet for Healthcare Providers: SeriousBroker.it  This test is not yet approved or cleared by the [place] FDA and has been authorized for detection and/or diagnosis of SARS-CoV-2 by FDA under an Emergency Use Authorization (EUA). This EUA will remain in effect (meaning this test can be used) for the duration of the COVID-19 declaration under Section 564(b)(1) of the Act, 21 U.S.C. section 360bbb-3(b)(1), unless the authorization is terminated or revoked.  Performed at [facility], [address], [place], [place] [address]   Blood Culture (routine x 2)     Status: None (Preliminary result)   Collection Time: 03/05/20  4:19 PM   Specimen: BLOOD  Result Value Ref Range Status   Specimen Description   Final    BLOOD RIGHT ANTECUBITAL Performed at [facility], [address] [place], [place] [address]    Special Requests   Final    BOTTLES DRAWN AEROBIC AND ANAEROBIC Blood Culture adequate volume Performed at [facility], [address], [place], [place] [address]    Culture   Final    NO GROWTH < 24 HOURS Performed at [facility], [address], [place], [place] [address]    Report Status PENDING  Incomplete  Blood Culture (routine x 2)     Status: None (Preliminary result)   Collection Time: 03/05/20  5:05 PM   Specimen: BLOOD  Result Value Ref Range Status   Specimen Description   Final    BLOOD LEFT ANTECUBITAL Performed at [facility], [address], [place], [place] [address]    Special Requests   Final    BOTTLES DRAWN AEROBIC AND ANAEROBIC Blood Culture adequate volume Performed at [facility], [address], [place], [place] [address]    Culture   Final    NO GROWTH < 24 HOURS Performed at [facility], [address] [place], [place] [address]    Report Status PENDING  Incomplete  Expectorated Sputum Assessment w Gram Stain, Rflx to Resp Cult     Status: None   Collection Time: 03/06/20  5:32 PM  Specimen: Sputum  Result Value Ref Range Status   Specimen Description SPU  Final   Special Requests NONE  Final   Sputum evaluation   Final    THIS SPECIMEN IS ACCEPTABLE FOR SPUTUM CULTURE Performed at [facility], [address] [place], [place] [address]    Report Status 03/06/2020 FINAL  Final  Culture, Respiratory w Gram Stain     Status: None (Preliminary result)   Collection Time: 03/06/20  5:32 PM   Specimen: Sputum  Result Value Ref Range Status   Specimen Description   Final    SPU Performed at [facility], [address] [place], [place] [address]    Special Requests   Final    NONE Reflexed from [id number] Performed at [facility], [address] [place], [place] [address]    Gram Stain   Final    ABUNDANT WBC PRESENT, PREDOMINANTLY PMN FEW SQUAMOUS EPITHELIAL CELLS PRESENT ABUNDANT GRAM POSITIVE COCCI ABUNDANT YEAST FEW GRAM POSITIVE RODS RARE GRAM NEGATIVE RODS    Culture   Final    TOO YOUNG TO READ Performed at [facility], [address] [place], [place] [address]    Report Status PENDING  Incomplete         Radiology Studies: No results found.      Scheduled Meds: . vitamin C  500 mg Oral BID  . calcium-vitamin D  2 tablet Oral Q breakfast  . enoxaparin (LOVENOX) injection  60 mg Subcutaneous Q24H  . fluticasone  2 spray Each Nare QHS  . gabapentin  600 mg Oral QHS  . ipratropium  2 puff Inhalation Q6H  . levothyroxine  137 mcg Oral QAC breakfast  . loratadine  10 mg Oral Daily  . methylPREDNISolone (SOLU-MEDROL) injection  60 mg Intravenous Q8H   Followed by  . [START ON 03/09/2020] methylPREDNISolone (SOLU-MEDROL) injection  60 mg Intravenous Q12H  . nystatin  5 mL Oral QID  .  nystatin   Topical TID  . pantoprazole  40 mg Oral Daily  . zinc sulfate  220 mg Oral Daily   Continuous Infusions: . azithromycin 500 mg (03/06/20 1928)  . cefTRIAXone (ROCEPHIN)  IV 2 g (03/07/20 1556)  . remdesivir 100 mg in NS 100 mL 100 mg (03/07/20 1046)     LOS: 2 days    Time spent: over 30 min     Lacretia Nicksaldwell Powell, MD [facility]   To contact the attending provider between 7A-7P or the covering provider during after hours 7P-7A, please log into the web site [email or web address] and access using universal [facility] password for that web site. If you do not have the password, please call the hospital operator.  03/07/2020, 8:30 PM

## 2020-03-07 NOTE — Progress Notes (Signed)
PT Cancellation Note  Patient Details Name: Rachel [name] MRN: [id number] DOB: 1955/07/09   Cancelled Treatment:    Reason Eval/Treat Not Completed: PT screened, no needs identified, will sign off (per RN and NT, pt is ambulating independently with O2 in the room without difficulty. No PT needed at present, will sign off.)   Tamala Ser PT 03/07/2020  Acute Rehabilitation Services Pager [phone] Office [phone]

## 2020-03-08 DIAGNOSIS — U071 COVID-19: Principal | ICD-10-CM

## 2020-03-08 DIAGNOSIS — J189 Pneumonia, unspecified organism: Secondary | ICD-10-CM

## 2020-03-08 LAB — CBC WITH DIFFERENTIAL/PLATELET
Abs Immature Granulocytes: 0.14 10*3/uL — ABNORMAL HIGH (ref 0.00–0.07)
Basophils Absolute: 0 10*3/uL (ref 0.0–0.1)
Basophils Relative: 0 %
Eosinophils Absolute: 0 10*3/uL (ref 0.0–0.5)
Eosinophils Relative: 0 %
HCT: 40.1 % (ref 36.0–46.0)
Hemoglobin: 12.8 g/dL (ref 12.0–15.0)
Immature Granulocytes: 2 %
Lymphocytes Relative: 15 %
Lymphs Abs: 1.1 10*3/uL (ref 0.7–4.0)
MCH: 30 pg (ref 26.0–34.0)
MCHC: 31.9 g/dL (ref 30.0–36.0)
MCV: 93.9 fL (ref 80.0–100.0)
Monocytes Absolute: 0.5 10*3/uL (ref 0.1–1.0)
Monocytes Relative: 6 %
Neutro Abs: 5.9 10*3/uL (ref 1.7–7.7)
Neutrophils Relative %: 77 %
Platelets: 184 10*3/uL (ref 150–400)
RBC: 4.27 MIL/uL (ref 3.87–5.11)
RDW: 13.8 % (ref 11.5–15.5)
WBC: 7.6 10*3/uL (ref 4.0–10.5)
nRBC: 0 % (ref 0.0–0.2)

## 2020-03-08 LAB — COMPREHENSIVE METABOLIC PANEL
ALT: 26 U/L (ref 0–44)
AST: 47 U/L — ABNORMAL HIGH (ref 15–41)
Albumin: 2.8 g/dL — ABNORMAL LOW (ref 3.5–5.0)
Alkaline Phosphatase: 55 U/L (ref 38–126)
Anion gap: 10 (ref 5–15)
BUN: 22 mg/dL (ref 8–23)
CO2: 26 mmol/L (ref 22–32)
Calcium: 9.2 mg/dL (ref 8.9–10.3)
Chloride: 110 mmol/L (ref 98–111)
Creatinine, Ser: 0.93 mg/dL (ref 0.44–1.00)
GFR, Estimated: >60 mL/min (ref >60)
Glucose, Bld: 146 mg/dL — ABNORMAL HIGH (ref 70–99)
Potassium: 4.1 mmol/L (ref 3.5–5.1)
Sodium: 146 mmol/L — ABNORMAL HIGH (ref 135–145)
Total Bilirubin: 0.5 mg/dL (ref 0.3–1.2)
Total Protein: 6.7 g/dL (ref 6.5–8.1)

## 2020-03-08 LAB — GLUCOSE, CAPILLARY
Glucose-Capillary: 151 mg/dL — ABNORMAL HIGH (ref 70–99)
Glucose-Capillary: 114 mg/dL — ABNORMAL HIGH (ref 70–99)
Glucose-Capillary: 139 mg/dL — ABNORMAL HIGH (ref 70–99)
Glucose-Capillary: 165 mg/dL — ABNORMAL HIGH (ref 70–99)

## 2020-03-08 LAB — PROCALCITONIN: Procalcitonin: 1 ng/mL

## 2020-03-08 LAB — C-REACTIVE PROTEIN: CRP: 4.6 mg/dL — ABNORMAL HIGH (ref <1.0)

## 2020-03-08 LAB — HEMOGLOBIN A1C
Hgb A1c MFr Bld: 5.6 % (ref 4.8–5.6)
Mean Plasma Glucose: 114.02 mg/dL

## 2020-03-08 LAB — D-DIMER, QUANTITATIVE: D-Dimer, Quant: 0.57 ug/mL-FEU — ABNORMAL HIGH (ref 0.00–0.50)

## 2020-03-08 MED ORDER — AZITHROMYCIN 250 MG PO TABS
500.0000 mg | ORAL_TABLET | Freq: Every day | ORAL | Status: AC
  Administered 2020-03-08 – 2020-03-09 (×2): 500 mg via ORAL
  Filled 2020-03-08 (×2): qty 2

## 2020-03-08 MED ORDER — SODIUM CHLORIDE 0.9 % IV SOLN
INTRAVENOUS | Status: DC | PRN
  Administered 2020-03-08 (×2): 250 mL via INTRAVENOUS

## 2020-03-08 NOTE — Progress Notes (Signed)
PROGRESS NOTE    Rachel [name]   ZOX:[id number]  DOB: 07-02-1955  DOA: 03/05/2020     3  PCP: Sigmund Hazel, MD  CC: SOB  Hospital Course: Rachel [name] is a 65 y.o. female with medical history significant for morbid obesity, OSA, GERD, history of allergic rhinitis, asthma when she was younger, vaccinated for COVID-19 in April 2021, osteoarthritis/back pain, hypothyroidism presents to the ED for evaluation of worsening shortness of breath. Patient reported she was exposed to COVID-19 patient then developed sore throat and fever.  She developed further worsening of cough, weakness, shortness of breath and was given molnupiravir outpatient.  Due to no relief she presented for further evaluation. She was found to be hypoxic and admitted for further work-up and treatment.     Interval History:  No events overnight.  Sitting up in bed breathing more comfortably and in no distress.  Old records reviewed in assessment of this patient  ROS: Constitutional: negative for chills and fevers, Respiratory: negative for cough, Cardiovascular: negative for chest pain and Gastrointestinal: negative for abdominal pain  Assessment & Plan: Acute Hypoxic Respiratory Failure Covid 19 PNA -Continue weaning oxygen as able -CXR reviewed, left-sided opacity appreciated concerning for probable superimposed bacterial pneumonia -Continue Rocephin and azithromycin -Continue remdesivir and steroid course -Continue trending inflammatory markers -Follow-up sputum culture, still in process -Strep pneumo urinary antigen and Legionella urinary antigen both negative -Continue incentive spirometer, flutter, out of bed as able  Hyperglycemia: - continue SSI - check A1c   Osteoarthritisof right knee/chronic back pain:Symptomatic management.  Hypothyroidism:Resume home Synthroid dose.  Allergic rhinitis/history of asthma as a kid, controlled with nebs/rescue inhaler at home:Continue respiratory support  as #1.  Morbid obesity with BMI 39/OSAhx-not on cpap:Counseling on weight loss strategy outpatient follow-up once medically stable. Does not use cpap at home.  GERD: cont ppi  Antimicrobials: Azithro 2/13 >> current Remdesivir 2/13>>current Rocephin 2/13>>current   DVT prophylaxis: Lovenox   Code Status:   Code Status: Full Code Family Communication:   Disposition Plan: Status is: Inpatient  Remains inpatient appropriate because:IV treatments appropriate due to intensity of illness or inability to take PO and Inpatient level of care appropriate due to severity of illness   Dispo: The patient is from: Home              Anticipated d/c is to: Home              Anticipated d/c date is: 3 days              Patient currently is not medically stable to d/c.   Difficult to place patient No  Risk of unplanned readmission score: Unplanned Admission- Pilot do not use: 9.33   Objective: Blood pressure 117/75, pulse (!) 56, temperature 97.8 F (36.6 C), temperature source Oral, resp. rate 15, height  (1.676 m), weight 108.7 kg, last menstrual period 06/08/2009, SpO2 95 %.  Examination: General appearance: alert, cooperative and no distress Head: Normocephalic, without obvious abnormality, atraumatic Eyes: EOMI Lungs: Distant but coarse breath sounds bilaterally Heart: regular rate and rhythm and S1, S2 normal Abdomen: normal findings: bowel sounds normal and soft, non-tender Extremities: no edema Skin: mobility and turgor normal Neurologic: Grossly normal  Consultants:     Procedures:     Data Reviewed: I have personally reviewed following labs and imaging studies Results for orders placed or performed during the hospital encounter of 03/05/20 (from the past 24 hour(s))  Glucose, capillary  Status: Abnormal   Collection Time: 03/07/20 11:52 PM  Result Value Ref Range   Glucose-Capillary 132 (H) 70 - 99 mg/dL  CBC with Differential/Platelet     Status:  Abnormal   Collection Time: 03/08/20  3:56 AM  Result Value Ref Range   WBC 7.6 4.0 - 10.5 K/uL   RBC 4.27 3.87 - 5.11 MIL/uL   Hemoglobin 12.8 12.0 - 15.0 g/dL   HCT 24.2 35.3 - 61.4 %   MCV 93.9 80.0 - 100.0 fL   MCH 30.0 26.0 - 34.0 pg   MCHC 31.9 30.0 - 36.0 g/dL   RDW 43.1 54.0 - 08.6 %   Platelets 184 150 - 400 K/uL   nRBC 0.0 0.0 - 0.2 %   Neutrophils Relative % 77 %   Neutro Abs 5.9 1.7 - 7.7 K/uL   Lymphocytes Relative 15 %   Lymphs Abs 1.1 0.7 - 4.0 K/uL   Monocytes Relative 6 %   Monocytes Absolute 0.5 0.1 - 1.0 K/uL   Eosinophils Relative 0 %   Eosinophils Absolute 0.0 0.0 - 0.5 K/uL   Basophils Relative 0 %   Basophils Absolute 0.0 0.0 - 0.1 K/uL   Immature Granulocytes 2 %   Abs Immature Granulocytes 0.14 (H) 0.00 - 0.07 K/uL   Reactive, Benign Lymphocytes PRESENT   Comprehensive metabolic panel     Status: Abnormal   Collection Time: 03/08/20  3:56 AM  Result Value Ref Range   Sodium 146 (H) 135 - 145 mmol/L   Potassium 4.1 3.5 - 5.1 mmol/L   Chloride 110 98 - 111 mmol/L   CO2 26 22 - 32 mmol/L   Glucose, Bld 146 (H) 70 - 99 mg/dL   BUN 22 8 - 23 mg/dL   Creatinine, Ser 7.61 0.44 - 1.00 mg/dL   Calcium 9.2 8.9 - 95.0 mg/dL   Total Protein 6.7 6.5 - 8.1 g/dL   Albumin 2.8 (L) 3.5 - 5.0 g/dL   AST 47 (H) 15 - 41 U/L   ALT 26 0 - 44 U/L   Alkaline Phosphatase 55 38 - 126 U/L   Total Bilirubin 0.5 0.3 - 1.2 mg/dL   GFR, Estimated >93 >26 mL/min   Anion gap 10 5 - 15  C-reactive protein     Status: Abnormal   Collection Time: 03/08/20  3:56 AM  Result Value Ref Range   CRP 4.6 (H) <1.0 mg/dL  D-dimer, quantitative (not at [facility])     Status: Abnormal   Collection Time: 03/08/20  3:56 AM  Result Value Ref Range   D-Dimer, Quant 0.57 (H) 0.00 - 0.50 ug/mL-FEU  Procalcitonin     Status: None   Collection Time: 03/08/20  3:56 AM  Result Value Ref Range   Procalcitonin 1.00 ng/mL  Glucose, capillary     Status: Abnormal   Collection Time: 03/08/20  8:01 AM   Result Value Ref Range   Glucose-Capillary 151 (H) 70 - 99 mg/dL  Glucose, capillary     Status: Abnormal   Collection Time: 03/08/20 11:35 AM  Result Value Ref Range   Glucose-Capillary 114 (H) 70 - 99 mg/dL    Recent Results (from the past 240 hour(s))  Resp Panel by RT-PCR (Flu A&B, Covid) Nasopharyngeal Swab     Status: Abnormal   Collection Time: 03/05/20  4:14 PM   Specimen: Nasopharyngeal Swab; Nasopharyngeal(NP) swabs in vial transport medium  Result Value Ref Range Status   SARS Coronavirus 2 by RT PCR POSITIVE (A) NEGATIVE  Final    Comment: RESULT CALLED TO, READ BACK BY AND VERIFIED WITH: BOWEN,M RN @1748  ON 03/05/20 JACKSON,K (NOTE) SARS-CoV-2 target nucleic acids are DETECTED.  The SARS-CoV-2 RNA is generally detectable in upper respiratory specimens during the acute phase of infection. Positive results are indicative of the presence of the identified virus, but do not rule out bacterial infection or co-infection with other pathogens not detected by the test. Clinical correlation with patient history and other diagnostic information is necessary to determine patient infection status. The expected result is Negative.  Fact Sheet for Patients: 03/07/20  Fact Sheet for Healthcare Providers: BloggerCourse.com  This test is not yet approved or cleared by the [employer] FDA and  has been authorized for detection and/or diagnosis of SARS-CoV-2 by FDA under an Emergency Use Authorization (EUA).  This EUA will remain in effect (meaning this test ca n be used) for the duration of  the COVID-19 declaration under Section 564(b)(1) of the Act, 21 U.S.C. section 360bbb-3(b)(1), unless the authorization is terminated or revoked sooner.     Influenza A by PCR NEGATIVE NEGATIVE Final   Influenza B by PCR NEGATIVE NEGATIVE Final    Comment: (NOTE) The Xpert Xpress SARS-CoV-2/FLU/RSV plus assay is intended as an  aid in the diagnosis of influenza from Nasopharyngeal swab specimens and should not be used as a sole basis for treatment. Nasal washings and aspirates are unacceptable for Xpert Xpress SARS-CoV-2/FLU/RSV testing.  Fact Sheet for Patients: [place]  Fact Sheet for Healthcare Providers: BloggerCourse.com  This test is not yet approved or cleared by the [employer]ious[employer]ker.[employer] FDA and has been authorized for detection and/or diagnosis of SARS-CoV-2 by FDA under an Emergency Use Authorization (EUA). This EUA will remain in effect (meaning this test can be used) for the duration of the COVID-19 declaration under Section 564(b)(1) of the Act, 21 U.S.C. section 360bbb-3(b)(1), unless the authorization is terminated or revoked.  Performed at [facility], [address] [place], [place]   Blood Culture (routine x 2)     Status: None (Preliminary result)   Collection Time: 03/05/20  4:19 PM   Specimen: BLOOD  Result Value Ref Range Status   Specimen Description   Final    BLOOD RIGHT ANTECUBITAL Performed at [facility], [address], [place], [place]    Special Requests   Final    BOTTLES DRAWN AEROBIC AND ANAEROBIC Blood Culture adequate volume Performed at [facility], [address], [place], [place]    Culture   Final    NO GROWTH 3 DAYS Performed at [facility], [address], [place], [place]    Report Status PENDING  Incomplete  Blood Culture (routine x 2)     Status: None (Preliminary result)   Collection Time: 03/05/20  5:05 PM   Specimen: BLOOD  Result Value Ref Range Status   Specimen Description   Final    BLOOD LEFT ANTECUBITAL Performed at [facility], [address] [place], [place]    Special Requests   Final    BOTTLES DRAWN AEROBIC AND ANAEROBIC Blood Culture adequate  volume Performed at [facility], [address], [place], [place]    Culture   Final    NO GROWTH 3 DAYS Performed at [facility], [address], [place], [place]    Report Status PENDING  Incomplete  Urine culture  Status: Abnormal (Preliminary result)   Collection Time: 03/06/20  5:32 PM   Specimen: In/Out Cath Urine  Result Value Ref Range Status   Specimen Description   Final    IN/OUT CATH URINE Performed at [facility], [address] [place], [place] [address]    Special Requests   Final    NONE Performed at [facility], [address] [place], [place] [address]    Culture (A)  Final    100 COLONIES/mL STAPHYLOCOCCUS EPIDERMIDIS CULTURE REINCUBATED FOR BETTER GROWTH Performed at [facility], [address] [place], [place] [address]    Report Status PENDING  Incomplete  Expectorated Sputum Assessment w Gram Stain, Rflx to Resp Cult     Status: None   Collection Time: 03/06/20  5:32 PM   Specimen: Sputum  Result Value Ref Range Status   Specimen Description SPU  Final   Special Requests NONE  Final   Sputum evaluation   Final    THIS SPECIMEN IS ACCEPTABLE FOR SPUTUM CULTURE Performed at [facility], [address] [place], [place] [address]    Report Status 03/06/2020 FINAL  Final  Culture, Respiratory w Gram Stain     Status: None (Preliminary result)   Collection Time: 03/06/20  5:32 PM   Specimen: Sputum  Result Value Ref Range Status   Specimen Description   Final    SPU Performed at [facility], [address] [place], [place] [address]    Special Requests   Final    NONE Reflexed from [id number] Performed at [facility], [address] [place], [place] [address]    Gram Stain   Final    ABUNDANT WBC PRESENT, PREDOMINANTLY PMN FEW SQUAMOUS EPITHELIAL CELLS PRESENT ABUNDANT GRAM POSITIVE  COCCI ABUNDANT YEAST FEW GRAM POSITIVE RODS RARE GRAM NEGATIVE RODS    Culture   Final    CULTURE REINCUBATED FOR BETTER GROWTH Performed at [facility], [address] [place], [place] [address]    Report Status PENDING  Incomplete     Radiology Studies: No results found. DG Chest Port 1 View  Final Result      Scheduled Meds: . vitamin C  500 mg Oral BID  . azithromycin  500 mg Oral Daily  . calcium-vitamin D  2 tablet Oral Q breakfast  . enoxaparin (LOVENOX) injection  60 mg Subcutaneous Q24H  . fluticasone  2 spray Each Nare QHS  . gabapentin  600 mg Oral QHS  . insulin aspart  0-9 Units Subcutaneous TID WC  . ipratropium  2 puff Inhalation Q6H  . levothyroxine  137 mcg Oral QAC breakfast  . loratadine  10 mg Oral Daily  . methylPREDNISolone (SOLU-MEDROL) injection  60 mg Intravenous Q8H   Followed by  . [START ON 03/09/2020] methylPREDNISolone (SOLU-MEDROL) injection  60 mg Intravenous Q12H  . nystatin  5 mL Oral QID  . nystatin   Topical TID  . pantoprazole  40 mg Oral Daily  . zinc sulfate  220 mg Oral Daily   PRN Meds: sodium chloride, acetaminophen, benzonatate, diclofenac Sodium, menthol-cetylpyridinium, ondansetron **OR** ondansetron (ZOFRAN) IV, traZODone Continuous Infusions: . sodium chloride Stopped (03/08/20 1200)  . cefTRIAXone (ROCEPHIN)  IV 2 g (03/07/20 1556)  . remdesivir 100 mg in NS 100 mL Stopped (03/08/20 1142)     LOS: 3 days  Time spent: Greater than 50% of the 35 minute visit was spent in counseling/coordination of  care for the patient as laid out in the A&P.   Lewie Chamber, MD [facility] 03/08/2020, 4:25 PM

## 2020-03-08 NOTE — Plan of Care (Signed)

## 2020-03-08 NOTE — Progress Notes (Signed)
PHARMACIST - PHYSICIAN COMMUNICATION  CONCERNING: Antibiotic IV to Oral Route Change Policy  RECOMMENDATION: This patient is receiving azithrymycin by the intravenous route.  Based on criteria approved by the Pharmacy and Therapeutics Committee, the antibiotic(s) is/are being converted to the equivalent oral dose form(s).   DESCRIPTION: These criteria include:  Patient being treated for a respiratory tract infection, urinary tract infection, cellulitis or clostridium difficile associated diarrhea if on metronidazole  The patient is not neutropenic and does not exhibit a GI malabsorption state  The patient is eating (either orally or via tube) and/or has been taking other orally administered medications for a least 24 hours  The patient is improving clinically and has a Tmax < 100.5  If you have questions about this conversion, please contact the Pharmacy Department  []   [phone] )  ( [phone] []   [phone] )  [facility] []   [phone] )  [facility] []   [phone] )  [facility] [x]   [phone] )  [facility]

## 2020-03-08 NOTE — Hospital Course (Addendum)
Rachel [name] is a 65 y.o. female with medical history significant for morbid obesity, OSA, GERD, history of allergic rhinitis, asthma when she was younger, vaccinated for COVID-19 in April 2021, osteoarthritis/back pain, hypothyroidism presents to the ED for evaluation of worsening shortness of breath. Patient reported she was exposed to COVID-19 patient then developed sore throat and fever.  She developed further worsening of cough, weakness, shortness of breath and was given molnupiravir outpatient.  Due to no relief she presented for further evaluation. She was found to be hypoxic and admitted for further work-up and treatment.  CXR on admission showed infiltrates, notably worse on the left.  There was suspicion for superimposed bacterial infection and she was initiated on Rocephin and azithromycin.  She was continued on steroids and completed course of remdesivir during hospitalization.  Antibiotic course was extended for a total of 7 days.  She was discharged on a further course of prednisone to complete at discharge as well. She required oxygen that was escalated up to salter high flow then able to be slowly weaned as inflammatory markers also down trended reassuringly.  She underwent a walk test prior to discharge and required 2 L nasal cannula oxygen which was arranged for home use at discharge.  She was clinically stable and much improved and considered stable for discharging home which she was amenable to as well.

## 2020-03-09 LAB — COMPREHENSIVE METABOLIC PANEL
ALT: 26 U/L (ref 0–44)
AST: 43 U/L — ABNORMAL HIGH (ref 15–41)
Albumin: 2.6 g/dL — ABNORMAL LOW (ref 3.5–5.0)
Alkaline Phosphatase: 47 U/L (ref 38–126)
Anion gap: 9 (ref 5–15)
BUN: 22 mg/dL (ref 8–23)
CO2: 24 mmol/L (ref 22–32)
Calcium: 9.1 mg/dL (ref 8.9–10.3)
Chloride: 109 mmol/L (ref 98–111)
Creatinine, Ser: 0.74 mg/dL (ref 0.44–1.00)
GFR, Estimated: >60 mL/min (ref >60)
Glucose, Bld: 148 mg/dL — ABNORMAL HIGH (ref 70–99)
Potassium: 4.1 mmol/L (ref 3.5–5.1)
Sodium: 142 mmol/L (ref 135–145)
Total Bilirubin: 0.6 mg/dL (ref 0.3–1.2)
Total Protein: 6 g/dL — ABNORMAL LOW (ref 6.5–8.1)

## 2020-03-09 LAB — MAGNESIUM: Magnesium: 1.9 mg/dL (ref 1.7–2.4)

## 2020-03-09 LAB — CBC WITH DIFFERENTIAL/PLATELET
Abs Immature Granulocytes: 0.39 10*3/uL — ABNORMAL HIGH (ref 0.00–0.07)
Basophils Absolute: 0 10*3/uL (ref 0.0–0.1)
Basophils Relative: 0 %
Eosinophils Absolute: 0 10*3/uL (ref 0.0–0.5)
Eosinophils Relative: 0 %
HCT: 41.4 % (ref 36.0–46.0)
Hemoglobin: 12.8 g/dL (ref 12.0–15.0)
Immature Granulocytes: 5 %
Lymphocytes Relative: 11 %
Lymphs Abs: 0.9 10*3/uL (ref 0.7–4.0)
MCH: 29.8 pg (ref 26.0–34.0)
MCHC: 30.9 g/dL (ref 30.0–36.0)
MCV: 96.3 fL (ref 80.0–100.0)
Monocytes Absolute: 0.3 10*3/uL (ref 0.1–1.0)
Monocytes Relative: 4 %
Neutro Abs: 6.4 10*3/uL (ref 1.7–7.7)
Neutrophils Relative %: 80 %
Platelets: 182 10*3/uL (ref 150–400)
RBC: 4.3 MIL/uL (ref 3.87–5.11)
RDW: 13.8 % (ref 11.5–15.5)
WBC: 8.1 10*3/uL (ref 4.0–10.5)
nRBC: 0 % (ref 0.0–0.2)

## 2020-03-09 LAB — D-DIMER, QUANTITATIVE: D-Dimer, Quant: 0.47 ug/mL-FEU (ref 0.00–0.50)

## 2020-03-09 LAB — CULTURE, RESPIRATORY W GRAM STAIN: Culture: NORMAL

## 2020-03-09 LAB — GLUCOSE, CAPILLARY
Glucose-Capillary: 124 mg/dL — ABNORMAL HIGH (ref 70–99)
Glucose-Capillary: 140 mg/dL — ABNORMAL HIGH (ref 70–99)
Glucose-Capillary: 157 mg/dL — ABNORMAL HIGH (ref 70–99)
Glucose-Capillary: 163 mg/dL — ABNORMAL HIGH (ref 70–99)

## 2020-03-09 LAB — HEMOGLOBIN A1C
Hgb A1c MFr Bld: 5.5 % (ref 4.8–5.6)
Mean Plasma Glucose: 111 mg/dL

## 2020-03-09 LAB — C-REACTIVE PROTEIN: CRP: 1.5 mg/dL — ABNORMAL HIGH (ref <1.0)

## 2020-03-09 LAB — PROCALCITONIN: Procalcitonin: 0.29 ng/mL

## 2020-03-09 NOTE — Progress Notes (Signed)
SATURATION QUALIFICATIONS: (This note is used to comply with regulatory documentation for home oxygen)  Patient Saturations on Room Air at Rest = 85%  Patient Saturations on Room Air while Ambulating = 76%  Patient Saturations on 8 Liters of oxygen while Ambulating = 88%  Please briefly explain why patient needs home oxygen: Patient will benefit from home oxygen to keep O2 sats >86%.

## 2020-03-09 NOTE — Progress Notes (Signed)
PROGRESS NOTE    Rachel [name]   FYB:[id number]  DOB: 05-24-1955  DOA: 03/05/2020     4  PCP: Sigmund Hazel, MD  CC: SOB  Hospital Course: Rachel [name] is a 65 y.o. female with medical history significant for morbid obesity, OSA, GERD, history of allergic rhinitis, asthma when she was younger, vaccinated for COVID-19 in April 2021, osteoarthritis/back pain, hypothyroidism presents to the ED for evaluation of worsening shortness of breath. Patient reported she was exposed to COVID-19 patient then developed sore throat and fever.  She developed further worsening of cough, weakness, shortness of breath and was given molnupiravir outpatient.  Due to no relief she presented for further evaluation. She was found to be hypoxic and admitted for further work-up and treatment.   Interval History:  No events overnight.  Breathing is more comfortable today.  Clearing her cough even more and pulling in better volumes on incentive spirometer.  Oxygen was further weaned some this morning bedside.  Old records reviewed in assessment of this patient  ROS: Constitutional: negative for chills and fevers, Respiratory: negative for cough, Cardiovascular: negative for chest pain and Gastrointestinal: negative for abdominal pain  Assessment & Plan: Acute Hypoxic Respiratory Failure Covid 19 PNA -Continue weaning oxygen as able -CXR reviewed, left-sided opacity appreciated concerning for probable superimposed bacterial pneumonia -Continue Rocephin and azithromycin -Continue remdesivir and steroid course -Continue trending inflammatory markers -Follow-up sputum culture, still in process; polymicrobial, possibly normal flora -Strep pneumo urinary antigen and Legionella urinary antigen both negative -Continue incentive spirometer, flutter, out of bed as able  Hyperglycemia: - continue SSI - check A1c, 5.6%  Osteoarthritisof right knee/chronic back pain:Symptomatic  management.  Hypothyroidism:Resume home Synthroid dose.  Allergic rhinitis/history of asthma as a kid, controlled with nebs/rescue inhaler at home:Continue respiratory support as #1.  Morbid obesity with BMI 39/OSAhx-not on cpap:Counseling on weight loss strategy outpatient follow-up once medically stable. Does not use cpap at home.  GERD: cont ppi  Antimicrobials: Azithro 2/13 >> current Remdesivir 2/13>>2/17 Rocephin 2/13>>current   DVT prophylaxis: Lovenox   Code Status:   Code Status: Full Code Family Communication:   Disposition Plan: Status is: Inpatient  Remains inpatient appropriate because:IV treatments appropriate due to intensity of illness or inability to take PO and Inpatient level of care appropriate due to severity of illness   Dispo: The patient is from: Home              Anticipated d/c is to: Home              Anticipated d/c date is: 3 days              Patient currently is not medically stable to d/c.   Difficult to place patient No  Risk of unplanned readmission score: Unplanned Admission- Pilot do not use: 9.24   Objective: Blood pressure 116/67, pulse (!) 54, temperature 98.2 F (36.8 C), temperature source Oral, resp. rate 18, height 5\' 6"  (1.676 m), weight 108.7 kg, last menstrual period 06/08/2009, SpO2 97 %.  Examination: General appearance: alert, cooperative and no distress Head: Normocephalic, without obvious abnormality, atraumatic Eyes: EOMI Lungs: Distant but coarse breath sounds bilaterally Heart: regular rate and rhythm and S1, S2 normal Abdomen: normal findings: bowel sounds normal and soft, non-tender Extremities: no edema Skin: mobility and turgor normal Neurologic: Grossly normal  Consultants:     Procedures:     Data Reviewed: I have personally reviewed following labs and imaging studies Results for orders placed or  performed during the hospital encounter of 03/05/20 (from the past 24 hour(s))  Glucose,  capillary     Status: Abnormal   Collection Time: 03/08/20  5:06 PM  Result Value Ref Range   Glucose-Capillary 139 (H) 70 - 99 mg/dL  Glucose, capillary     Status: Abnormal   Collection Time: 03/08/20  9:35 PM  Result Value Ref Range   Glucose-Capillary 165 (H) 70 - 99 mg/dL  CBC with Differential/Platelet     Status: Abnormal   Collection Time: 03/09/20  3:41 AM  Result Value Ref Range   WBC 8.1 4.0 - 10.5 K/uL   RBC 4.30 3.87 - 5.11 MIL/uL   Hemoglobin 12.8 12.0 - 15.0 g/dL   HCT 98.9 21.1 - 94.1 %   MCV 96.3 80.0 - 100.0 fL   MCH 29.8 26.0 - 34.0 pg   MCHC 30.9 30.0 - 36.0 g/dL   RDW 74.0 81.4 - 48.1 %   Platelets 182 150 - 400 K/uL   nRBC 0.0 0.0 - 0.2 %   Neutrophils Relative % 80 %   Neutro Abs 6.4 1.7 - 7.7 K/uL   Lymphocytes Relative 11 %   Lymphs Abs 0.9 0.7 - 4.0 K/uL   Monocytes Relative 4 %   Monocytes Absolute 0.3 0.1 - 1.0 K/uL   Eosinophils Relative 0 %   Eosinophils Absolute 0.0 0.0 - 0.5 K/uL   Basophils Relative 0 %   Basophils Absolute 0.0 0.0 - 0.1 K/uL   Immature Granulocytes 5 %   Abs Immature Granulocytes 0.39 (H) 0.00 - 0.07 K/uL  Comprehensive metabolic panel     Status: Abnormal   Collection Time: 03/09/20  3:41 AM  Result Value Ref Range   Sodium 142 135 - 145 mmol/L   Potassium 4.1 3.5 - 5.1 mmol/L   Chloride 109 98 - 111 mmol/L   CO2 24 22 - 32 mmol/L   Glucose, Bld 148 (H) 70 - 99 mg/dL   BUN 22 8 - 23 mg/dL   Creatinine, Ser 8.56 0.44 - 1.00 mg/dL   Calcium 9.1 8.9 - 31.4 mg/dL   Total Protein 6.0 (L) 6.5 - 8.1 g/dL   Albumin 2.6 (L) 3.5 - 5.0 g/dL   AST 43 (H) 15 - 41 U/L   ALT 26 0 - 44 U/L   Alkaline Phosphatase 47 38 - 126 U/L   Total Bilirubin 0.6 0.3 - 1.2 mg/dL   GFR, Estimated >97 >02 mL/min   Anion gap 9 5 - 15  C-reactive protein     Status: Abnormal   Collection Time: 03/09/20  3:41 AM  Result Value Ref Range   CRP 1.5 (H) <1.0 mg/dL  D-dimer, quantitative (not at [facility])     Status: None   Collection Time: 03/09/20   3:41 AM  Result Value Ref Range   D-Dimer, Quant 0.47 0.00 - 0.50 ug/mL-FEU  Procalcitonin     Status: None   Collection Time: 03/09/20  3:41 AM  Result Value Ref Range   Procalcitonin 0.29 ng/mL  Magnesium     Status: None   Collection Time: 03/09/20  3:41 AM  Result Value Ref Range   Magnesium 1.9 1.7 - 2.4 mg/dL  Glucose, capillary     Status: Abnormal   Collection Time: 03/09/20  8:07 AM  Result Value Ref Range   Glucose-Capillary 124 (H) 70 - 99 mg/dL  Glucose, capillary     Status: Abnormal   Collection Time: 03/09/20 11:56 AM  Result Value Ref  Range   Glucose-Capillary 140 (H) 70 - 99 mg/dL    Recent Results (from the past 240 hour(s))  Resp Panel by RT-PCR (Flu A&B, Covid) Nasopharyngeal Swab     Status: Abnormal   Collection Time: 03/05/20  4:14 PM   Specimen: Nasopharyngeal Swab; Nasopharyngeal(NP) swabs in vial transport medium  Result Value Ref Range Status   SARS Coronavirus 2 by RT PCR POSITIVE (A) NEGATIVE Final    Comment: RESULT CALLED TO, READ BACK BY AND VERIFIED WITH: BOWEN,M RN @1748  ON 03/05/20 JACKSON,K (NOTE) SARS-CoV-2 target nucleic acids are DETECTED.  The SARS-CoV-2 RNA is generally detectable in upper respiratory specimens during the acute phase of infection. Positive results are indicative of the presence of the identified virus, but do not rule out bacterial infection or co-infection with other pathogens not detected by the test. Clinical correlation with patient history and other diagnostic information is necessary to determine patient infection status. The expected result is Negative.  Fact Sheet for Patients: 03/07/20  Fact Sheet for Healthcare Providers: BloggerCourse.com  This test is not yet approved or cleared by the [employer] FDA and  has been authorized for detection and/or diagnosis of SARS-CoV-2 by FDA under an Emergency Use Authorization (EUA).  This EUA will remain  in effect (meaning this test ca n be used) for the duration of  the COVID-19 declaration under Section 564(b)(1) of the Act, 21 U.S.C. section 360bbb-3(b)(1), unless the authorization is terminated or revoked sooner.     Influenza A by PCR NEGATIVE NEGATIVE Final   Influenza B by PCR NEGATIVE NEGATIVE Final    Comment: (NOTE) The Xpert Xpress SARS-CoV-2/FLU/RSV plus assay is intended as an aid in the diagnosis of influenza from Nasopharyngeal swab specimens and should not be used as a sole basis for treatment. Nasal washings and aspirates are unacceptable for Xpert Xpress SARS-CoV-2/FLU/RSV testing.  Fact Sheet for Patients: [place]  Fact Sheet for Healthcare Providers: BloggerCourse.com  This test is not yet approved or cleared by the [employer] FDA and has been authorized for detection and/or diagnosis of SARS-CoV-2 by FDA under an Emergency Use Authorization (EUA). This EUA will remain in effect (meaning this test can be used) for the duration of the COVID-19 declaration under Section 564(b)(1) of the Act, 21 U.S.C. section 360bbb-3(b)(1), unless the authorization is terminated or revoked.  Performed at [facility], [address] [place], [place]   Blood Culture (routine x 2)     Status: None (Preliminary result)   Collection Time: 03/05/20  4:19 PM   Specimen: BLOOD  Result Value Ref Range Status   Specimen Description   Final    BLOOD RIGHT ANTECUBITAL Performed at [facility], [address], [place], [place]    Special Requests   Final    BOTTLES DRAWN AEROBIC AND ANAEROBIC Blood Culture adequate volume Performed at [facility], [address] [place], [place]    Culture   Final    NO GROWTH 4 DAYS Performed at [facility], [address] [place], [place]    Report Status PENDING  Incomplete   Blood Culture (routine x 2)     Status: None (Preliminary result)   Collection Time: 03/05/20  5:05 PM   Specimen: BLOOD  Result Value Ref Range Status   Specimen Description   Final    BLOOD LEFT ANTECUBITAL Performed at [facility], [address] [place], [place]  Special Requests   Final    BOTTLES DRAWN AEROBIC AND ANAEROBIC Blood Culture adequate volume Performed at [facility], [address], [place], [place] [address]    Culture   Final    NO GROWTH 4 DAYS Performed at [facility], [address] [place], [place] [address]    Report Status PENDING  Incomplete  Urine culture     Status: Abnormal (Preliminary result)   Collection Time: 03/06/20  5:32 PM   Specimen: In/Out Cath Urine  Result Value Ref Range Status   Specimen Description   Final    IN/OUT CATH URINE Performed at [facility], [address] [place], [place] [address]    Special Requests   Final    NONE Performed at [facility], [address], [place], [place] [address]    Culture (A)  Final    100 COLONIES/mL STAPHYLOCOCCUS EPIDERMIDIS CULTURE REINCUBATED FOR BETTER GROWTH Performed at [facility], [address], [place], [place] [address]    Report Status PENDING  Incomplete  Expectorated Sputum Assessment w Gram Stain, Rflx to Resp Cult     Status: None   Collection Time: 03/06/20  5:32 PM   Specimen: Sputum  Result Value Ref Range Status   Specimen Description SPU  Final   Special Requests NONE  Final   Sputum evaluation   Final    THIS SPECIMEN IS ACCEPTABLE FOR SPUTUM CULTURE Performed at [facility], [address], [place], [place] [address]    Report Status 03/06/2020 FINAL  Final  Culture, Respiratory w Gram Stain     Status: None   Collection Time: 03/06/20  5:32 PM   Specimen: Sputum  Result Value Ref Range Status   Specimen Description   Final     SPU Performed at [facility], [address], [place], [place] [address]    Special Requests   Final    NONE Reflexed from [id number] Performed at [facility], [address] [place], [place] [address]    Gram Stain   Final    ABUNDANT WBC PRESENT, PREDOMINANTLY PMN FEW SQUAMOUS EPITHELIAL CELLS PRESENT ABUNDANT GRAM POSITIVE COCCI ABUNDANT YEAST FEW GRAM POSITIVE RODS RARE GRAM NEGATIVE RODS    Culture   Final    FEW Normal respiratory flora-no Staph aureus or Pseudomonas seen Performed at [facility], [address] [place], [place] [address]    Report Status 03/09/2020 FINAL  Final     Radiology Studies: No results found. DG Chest Port 1 View  Final Result      Scheduled Meds: . vitamin C  500 mg Oral BID  . calcium-vitamin D  2 tablet Oral Q breakfast  . enoxaparin (LOVENOX) injection  60 mg Subcutaneous Q24H  . fluticasone  2 spray Each Nare QHS  . gabapentin  600 mg Oral QHS  . insulin aspart  0-9 Units Subcutaneous TID WC  . ipratropium  2 puff Inhalation Q6H  . levothyroxine  137 mcg Oral QAC breakfast  . loratadine  10 mg Oral Daily  . methylPREDNISolone (SOLU-MEDROL) injection  60 mg Intravenous Q12H  . nystatin  5 mL Oral QID  . nystatin   Topical TID  . pantoprazole  40 mg Oral Daily  . zinc sulfate  220 mg Oral Daily   PRN Meds: sodium chloride, acetaminophen, benzonatate, diclofenac Sodium, menthol-cetylpyridinium, ondansetron **OR** ondansetron (ZOFRAN) IV, traZODone Continuous Infusions: .  sodium chloride Stopped (03/08/20 1744)  . cefTRIAXone (ROCEPHIN)  IV 2 g (03/09/20 1441)     LOS: 4 days  Time spent: Greater than 50% of the 35 minute visit was spent in counseling/coordination of care for the patient as laid out in the A&P.   Lewie Chamberavid Jaime Dome, MD [facility] 03/09/2020, 2:48 PM

## 2020-03-10 DIAGNOSIS — J9601 Acute respiratory failure with hypoxia: Secondary | ICD-10-CM

## 2020-03-10 DIAGNOSIS — R739 Hyperglycemia, unspecified: Secondary | ICD-10-CM

## 2020-03-10 LAB — COMPREHENSIVE METABOLIC PANEL
ALT: 26 U/L (ref 0–44)
AST: 32 U/L (ref 15–41)
Albumin: 2.4 g/dL — ABNORMAL LOW (ref 3.5–5.0)
Alkaline Phosphatase: 42 U/L (ref 38–126)
Anion gap: 9 (ref 5–15)
BUN: 26 mg/dL — ABNORMAL HIGH (ref 8–23)
CO2: 24 mmol/L (ref 22–32)
Calcium: 8.8 mg/dL — ABNORMAL LOW (ref 8.9–10.3)
Chloride: 109 mmol/L (ref 98–111)
Creatinine, Ser: 0.69 mg/dL (ref 0.44–1.00)
GFR, Estimated: >60 mL/min (ref >60)
Glucose, Bld: 153 mg/dL — ABNORMAL HIGH (ref 70–99)
Potassium: 3.8 mmol/L (ref 3.5–5.1)
Sodium: 142 mmol/L (ref 135–145)
Total Bilirubin: 0.6 mg/dL (ref 0.3–1.2)
Total Protein: 5.6 g/dL — ABNORMAL LOW (ref 6.5–8.1)

## 2020-03-10 LAB — CBC WITH DIFFERENTIAL/PLATELET
Abs Immature Granulocytes: 0.18 10*3/uL — ABNORMAL HIGH (ref 0.00–0.07)
Basophils Absolute: 0 10*3/uL (ref 0.0–0.1)
Basophils Relative: 0 %
Eosinophils Absolute: 0 10*3/uL (ref 0.0–0.5)
Eosinophils Relative: 0 %
HCT: 38.2 % (ref 36.0–46.0)
Hemoglobin: 12.2 g/dL (ref 12.0–15.0)
Immature Granulocytes: 2 %
Lymphocytes Relative: 10 %
Lymphs Abs: 0.8 10*3/uL (ref 0.7–4.0)
MCH: 30.2 pg (ref 26.0–34.0)
MCHC: 31.9 g/dL (ref 30.0–36.0)
MCV: 94.6 fL (ref 80.0–100.0)
Monocytes Absolute: 0.3 10*3/uL (ref 0.1–1.0)
Monocytes Relative: 3 %
Neutro Abs: 7.2 10*3/uL (ref 1.7–7.7)
Neutrophils Relative %: 85 %
Platelets: 166 10*3/uL (ref 150–400)
RBC: 4.04 MIL/uL (ref 3.87–5.11)
RDW: 13.6 % (ref 11.5–15.5)
WBC: 8.4 10*3/uL (ref 4.0–10.5)
nRBC: 0 % (ref 0.0–0.2)

## 2020-03-10 LAB — GLUCOSE, CAPILLARY
Glucose-Capillary: 122 mg/dL — ABNORMAL HIGH (ref 70–99)
Glucose-Capillary: 130 mg/dL — ABNORMAL HIGH (ref 70–99)
Glucose-Capillary: 192 mg/dL — ABNORMAL HIGH (ref 70–99)
Glucose-Capillary: 166 mg/dL — ABNORMAL HIGH (ref 70–99)

## 2020-03-10 LAB — D-DIMER, QUANTITATIVE: D-Dimer, Quant: 0.49 ug/mL-FEU (ref 0.00–0.50)

## 2020-03-10 LAB — CULTURE, BLOOD (ROUTINE X 2)
Culture: NO GROWTH
Culture: NO GROWTH
Special Requests: ADEQUATE
Special Requests: ADEQUATE

## 2020-03-10 LAB — PROCALCITONIN: Procalcitonin: <0.1 ng/mL

## 2020-03-10 LAB — MAGNESIUM: Magnesium: 2.2 mg/dL (ref 1.7–2.4)

## 2020-03-10 LAB — C-REACTIVE PROTEIN: CRP: 0.7 mg/dL (ref <1.0)

## 2020-03-10 MED ORDER — SALINE SPRAY 0.65 % NA SOLN
1.0000 | NASAL | Status: DC | PRN
  Administered 2020-03-10: 1 via NASAL
  Filled 2020-03-10: qty 44

## 2020-03-10 MED ORDER — AZITHROMYCIN 250 MG PO TABS
500.0000 mg | ORAL_TABLET | Freq: Every day | ORAL | Status: AC
  Administered 2020-03-10 – 2020-03-11 (×2): 500 mg via ORAL
  Filled 2020-03-10 (×2): qty 2

## 2020-03-10 MED ORDER — SODIUM CHLORIDE 0.9 % IV SOLN
2.0000 g | INTRAVENOUS | Status: AC
  Administered 2020-03-10 – 2020-03-11 (×2): 2 g via INTRAVENOUS
  Filled 2020-03-10 (×2): qty 20

## 2020-03-10 NOTE — Assessment & Plan Note (Addendum)
-   2/2 covid PNA - continue weaning O2 as able: Discharged on 2 L nasal cannula - continue IS

## 2020-03-10 NOTE — Assessment & Plan Note (Signed)
Resume home Synthroid dose.

## 2020-03-10 NOTE — Assessment & Plan Note (Addendum)
-  Continue weaning oxygen as able -CXR reviewed, left-sided opacity appreciated concerning for probable superimposed bacterial pneumonia -Continue Rocephin and azithromycin; extend for total of 7 day course - s/p 5 days remdesivir - continue solu-medrol; transitioned to prednisone at discharge to complete course -Inflammatory markers do continue to downtrend reassuringly -Follow-up sputum culture, still in process; polymicrobial, possibly normal flora -Strep pneumo urinary antigen and Legionella urinary antigen both negative -Continue incentive spirometer, flutter, out of bed as able

## 2020-03-10 NOTE — Assessment & Plan Note (Addendum)
-   continue SSI while hospitalized - check A1c, 5.6%

## 2020-03-10 NOTE — Assessment & Plan Note (Signed)
-   Counseling on weight loss strategy outpatient follow-up once medically stable. Does not use cpap at home.

## 2020-03-10 NOTE — Assessment & Plan Note (Addendum)
as a kid, controlled with nebs/rescue inhaler at home

## 2020-03-10 NOTE — Progress Notes (Signed)
PROGRESS NOTE    Rachel [name]   ZOX:[id number]  DOB: 11-Aug-1955  DOA: 03/05/2020     5  PCP: Sigmund HazelMiller, Lisa, MD  CC: SOB  Hospital Course: Rachel [name] is a 65 y.o. female with medical history significant for morbid obesity, OSA, GERD, history of allergic rhinitis, asthma when she was younger, vaccinated for COVID-19 in April 2021, osteoarthritis/back pain, hypothyroidism presents to the ED for evaluation of worsening shortness of breath. Patient reported she was exposed to COVID-19 patient then developed sore throat and fever.  She developed further worsening of cough, weakness, shortness of breath and was given molnupiravir outpatient.  Due to no relief she presented for further evaluation. She was found to be hypoxic and admitted for further work-up and treatment.   Interval History:  No events overnight.  Underwent walk test yesterday and desaturated quite a bit requiring transition back to salter high flow.  She was stable at rest on nasal cannula but with exertion desaturates quite a bit.  Repeating walk test today.  Hopeful that she will continue improving in the next couple days  Old records reviewed in assessment of this patient  ROS: Constitutional: negative for chills and fevers, Respiratory: negative for cough, Cardiovascular: negative for chest pain and Gastrointestinal: negative for abdominal pain  Assessment & Plan: Acute Hypoxic Respiratory Failure Covid 19 PNA -Continue weaning oxygen as able -CXR reviewed, left-sided opacity appreciated concerning for probable superimposed bacterial pneumonia -Continue Rocephin and azithromycin; extend for total of 7 day course - s/p 5 days remdesivir - continue solu-medrol -Inflammatory markers do continue to downtrend reassuringly.  Hopeful that oxygen requirement starts to further downtrend as well.  If does further escalate, will initiate on baricitinib -Follow-up sputum culture, still in process; polymicrobial, possibly  normal flora -Strep pneumo urinary antigen and Legionella urinary antigen both negative -Continue incentive spirometer, flutter, out of bed as able  Hyperglycemia: - continue SSI - check A1c, 5.6%  Osteoarthritisof right knee/chronic back pain:Symptomatic management.  Hypothyroidism:Resume home Synthroid dose.  Allergic rhinitis/history of asthma as a kid, controlled with nebs/rescue inhaler at home:Continue respiratory support as #1.  Morbid obesity with BMI 39/OSAhx-not on cpap:Counseling on weight loss strategy outpatient follow-up once medically stable. Does not use cpap at home.  GERD: cont ppi  Antimicrobials: Azithro 2/13 >> current Remdesivir 2/13>>2/17 Rocephin 2/13>>current   DVT prophylaxis: Lovenox   Code Status:   Code Status: Full Code Family Communication:   Disposition Plan: Status is: Inpatient  Remains inpatient appropriate because:IV treatments appropriate due to intensity of illness or inability to take PO and Inpatient level of care appropriate due to severity of illness   Dispo: The patient is from: Home              Anticipated d/c is to: Home              Anticipated d/c date is: 3 days              Patient currently is not medically stable to d/c.   Difficult to place patient No  Risk of unplanned readmission score: Unplanned Admission- Pilot do not use: 12.37   Objective: Blood pressure 125/78, pulse (!) 51, temperature 98.4 F (36.9 C), temperature source Oral, resp. rate 20, height 5\' 6"  (1.676 m), weight 112.5 kg, last menstrual period 06/08/2009, SpO2 96 %.  Examination: General appearance: alert, cooperative and no distress Head: Normocephalic, without obvious abnormality, atraumatic Eyes: EOMI Lungs: Distant but coarse breath sounds bilaterally Heart: regular  rate and rhythm and S1, S2 normal Abdomen: normal findings: bowel sounds normal and soft, non-tender Extremities: no edema Skin: mobility and turgor  normal Neurologic: Grossly normal  Consultants:     Procedures:     Data Reviewed: I have personally reviewed following labs and imaging studies Results for orders placed or performed during the hospital encounter of 03/05/20 (from the past 24 hour(s))  Glucose, capillary     Status: Abnormal   Collection Time: 03/09/20  4:59 PM  Result Value Ref Range   Glucose-Capillary 157 (H) 70 - 99 mg/dL  Glucose, capillary     Status: Abnormal   Collection Time: 03/09/20  9:06 PM  Result Value Ref Range   Glucose-Capillary 163 (H) 70 - 99 mg/dL  CBC with Differential/Platelet     Status: Abnormal   Collection Time: 03/10/20  3:32 AM  Result Value Ref Range   WBC 8.4 4.0 - 10.5 K/uL   RBC 4.04 3.87 - 5.11 MIL/uL   Hemoglobin 12.2 12.0 - 15.0 g/dL   HCT 16.1 09.6 - 04.5 %   MCV 94.6 80.0 - 100.0 fL   MCH 30.2 26.0 - 34.0 pg   MCHC 31.9 30.0 - 36.0 g/dL   RDW 40.9 81.1 - 91.4 %   Platelets 166 150 - 400 K/uL   nRBC 0.0 0.0 - 0.2 %   Neutrophils Relative % 85 %   Neutro Abs 7.2 1.7 - 7.7 K/uL   Lymphocytes Relative 10 %   Lymphs Abs 0.8 0.7 - 4.0 K/uL   Monocytes Relative 3 %   Monocytes Absolute 0.3 0.1 - 1.0 K/uL   Eosinophils Relative 0 %   Eosinophils Absolute 0.0 0.0 - 0.5 K/uL   Basophils Relative 0 %   Basophils Absolute 0.0 0.0 - 0.1 K/uL   Immature Granulocytes 2 %   Abs Immature Granulocytes 0.18 (H) 0.00 - 0.07 K/uL  Comprehensive metabolic panel     Status: Abnormal   Collection Time: 03/10/20  3:32 AM  Result Value Ref Range   Sodium 142 135 - 145 mmol/L   Potassium 3.8 3.5 - 5.1 mmol/L   Chloride 109 98 - 111 mmol/L   CO2 24 22 - 32 mmol/L   Glucose, Bld 153 (H) 70 - 99 mg/dL   BUN 26 (H) 8 - 23 mg/dL   Creatinine, Ser 7.82 0.44 - 1.00 mg/dL   Calcium 8.8 (L) 8.9 - 10.3 mg/dL   Total Protein 5.6 (L) 6.5 - 8.1 g/dL   Albumin 2.4 (L) 3.5 - 5.0 g/dL   AST 32 15 - 41 U/L   ALT 26 0 - 44 U/L   Alkaline Phosphatase 42 38 - 126 U/L   Total Bilirubin 0.6 0.3 -  1.2 mg/dL   GFR, Estimated >95 >62 mL/min   Anion gap 9 5 - 15  C-reactive protein     Status: None   Collection Time: 03/10/20  3:32 AM  Result Value Ref Range   CRP 0.7 <1.0 mg/dL  D-dimer, quantitative (not at [facility])     Status: None   Collection Time: 03/10/20  3:32 AM  Result Value Ref Range   D-Dimer, Quant 0.49 0.00 - 0.50 ug/mL-FEU  Procalcitonin     Status: None   Collection Time: 03/10/20  3:32 AM  Result Value Ref Range   Procalcitonin <0.10 ng/mL  Magnesium     Status: None   Collection Time: 03/10/20  3:32 AM  Result Value Ref Range   Magnesium 2.2 1.7 -  2.4 mg/dL  Glucose, capillary     Status: Abnormal   Collection Time: 03/10/20  7:43 AM  Result Value Ref Range   Glucose-Capillary 122 (H) 70 - 99 mg/dL  Glucose, capillary     Status: Abnormal   Collection Time: 03/10/20 11:40 AM  Result Value Ref Range   Glucose-Capillary 130 (H) 70 - 99 mg/dL    Recent Results (from the past 240 hour(s))  Resp Panel by RT-PCR (Flu A&B, Covid) Nasopharyngeal Swab     Status: Abnormal   Collection Time: 03/05/20  4:14 PM   Specimen: Nasopharyngeal Swab; Nasopharyngeal(NP) swabs in vial transport medium  Result Value Ref Range Status   SARS Coronavirus 2 by RT PCR POSITIVE (A) NEGATIVE Final    Comment: RESULT CALLED TO, READ BACK BY AND VERIFIED WITH: BOWEN,M RN @1748  ON 03/05/20 JACKSON,K (NOTE) SARS-CoV-2 target nucleic acids are DETECTED.  The SARS-CoV-2 RNA is generally detectable in upper respiratory specimens during the acute phase of infection. Positive results are indicative of the presence of the identified virus, but do not rule out bacterial infection or co-infection with other pathogens not detected by the test. Clinical correlation with patient history and other diagnostic information is necessary to determine patient infection status. The expected result is Negative.  Fact Sheet for Patients: 03/07/20  Fact Sheet for  Healthcare Providers: BloggerCourse.com  This test is not yet approved or cleared by the [employer] FDA and  has been authorized for detection and/or diagnosis of SARS-CoV-2 by FDA under an Emergency Use Authorization (EUA).  This EUA will remain in effect (meaning this test ca n be used) for the duration of  the COVID-19 declaration under Section 564(b)(1) of the Act, 21 U.S.C. section 360bbb-3(b)(1), unless the authorization is terminated or revoked sooner.     Influenza A by PCR NEGATIVE NEGATIVE Final   Influenza B by PCR NEGATIVE NEGATIVE Final    Comment: (NOTE) The Xpert Xpress SARS-CoV-2/FLU/RSV plus assay is intended as an aid in the diagnosis of influenza from Nasopharyngeal swab specimens and should not be used as a sole basis for treatment. Nasal washings and aspirates are unacceptable for Xpert Xpress SARS-CoV-2/FLU/RSV testing.  Fact Sheet for Patients: [place]  Fact Sheet for Healthcare Providers: BloggerCourse.com  This test is not yet approved or cleared by the [employer] FDA and has been authorized for detection and/or diagnosis of SARS-CoV-2 by FDA under an Emergency Use Authorization (EUA). This EUA will remain in effect (meaning this test can be used) for the duration of the COVID-19 declaration under Section 564(b)(1) of the Act, 21 U.S.C. section 360bbb-3(b)(1), unless the authorization is terminated or revoked.  Performed at [facility], [address] [place], [place]   Blood Culture (routine x 2)     Status: None (Preliminary result)   Collection Time: 03/05/20  4:19 PM   Specimen: BLOOD  Result Value Ref Range Status   Specimen Description   Final    BLOOD RIGHT ANTECUBITAL Performed at [facility], [address], [place], [place]    Special Requests   Final    BOTTLES DRAWN AEROBIC AND ANAEROBIC  Blood Culture adequate volume Performed at [facility], [address], [place], [place]    Culture   Final    NO GROWTH 4 DAYS Performed at [facility], [address] [place], [place]    Report Status PENDING  Incomplete  Blood Culture (routine x  2)     Status: None (Preliminary result)   Collection Time: 03/05/20  5:05 PM   Specimen: BLOOD  Result Value Ref Range Status   Specimen Description   Final    BLOOD LEFT ANTECUBITAL Performed at [facility], [address], [place], [place] [address]    Special Requests   Final    BOTTLES DRAWN AEROBIC AND ANAEROBIC Blood Culture adequate volume Performed at [facility], [address] [place], [place] [address]    Culture   Final    NO GROWTH 4 DAYS Performed at [facility], [address] [place], [place] [address]    Report Status PENDING  Incomplete  Urine culture     Status: Abnormal (Preliminary result)   Collection Time: 03/06/20  5:32 PM   Specimen: In/Out Cath Urine  Result Value Ref Range Status   Specimen Description   Final    IN/OUT CATH URINE Performed at [facility], [address], [place], [place] [address]    Special Requests   Final    NONE Performed at [facility], [address] [place], [place] [address]    Culture (A)  Final    100 COLONIES/mL STAPHYLOCOCCUS EPIDERMIDIS SUSCEPTIBILITIES TO FOLLOW Performed at [facility], [address], [place], [place] [address]    Report Status PENDING  Incomplete  Expectorated Sputum Assessment w Gram Stain, Rflx to Resp Cult     Status: None   Collection Time: 03/06/20  5:32 PM   Specimen: Sputum  Result Value Ref Range Status   Specimen Description SPU  Final   Special Requests NONE  Final   Sputum evaluation   Final    THIS SPECIMEN IS ACCEPTABLE FOR SPUTUM CULTURE Performed at [facility], [address] [place], [place] [address]    Report Status 03/06/2020 FINAL  Final  Culture, Respiratory w Gram Stain     Status: None   Collection Time: 03/06/20  5:32 PM   Specimen: Sputum  Result Value Ref Range Status   Specimen Description   Final    SPU Performed at [facility], [address], [place], [place] [address]    Special Requests   Final    NONE Reflexed from [phone] Performed at [facility], [address] [place], [place] [address]    Gram Stain   Final    ABUNDANT WBC PRESENT, PREDOMINANTLY PMN FEW SQUAMOUS EPITHELIAL CELLS PRESENT ABUNDANT GRAM POSITIVE COCCI ABUNDANT YEAST FEW GRAM POSITIVE RODS RARE GRAM NEGATIVE RODS    Culture   Final    FEW Normal respiratory flora-no Staph aureus or Pseudomonas seen Performed at [facility], [address] [place], [place] [address]    Report Status 03/09/2020 FINAL  Final     Radiology Studies: No results found. DG Chest Port 1 View  Final Result      Scheduled Meds: . vitamin C  500 mg Oral BID  . azithromycin  500 mg Oral Daily  . calcium-vitamin D  2 tablet Oral Q breakfast  . enoxaparin (LOVENOX) injection  60 mg Subcutaneous Q24H  . fluticasone  2 spray Each Nare QHS  . gabapentin  600 mg Oral QHS  . insulin aspart  0-9 Units Subcutaneous TID WC  . ipratropium  2 puff Inhalation Q6H  . levothyroxine  137 mcg Oral QAC breakfast  . loratadine  10 mg Oral Daily  .  methylPREDNISolone (SOLU-MEDROL) injection  60 mg Intravenous Q12H  . nystatin  5 mL Oral QID  . nystatin   Topical TID  . pantoprazole  40 mg Oral Daily  . zinc sulfate  220 mg Oral Daily   PRN Meds: sodium chloride, acetaminophen, benzonatate, diclofenac Sodium, menthol-cetylpyridinium, ondansetron **OR** ondansetron (ZOFRAN) IV, traZODone Continuous Infusions: . sodium chloride Stopped (03/08/20 1744)  . cefTRIAXone (ROCEPHIN)  IV       LOS: 5 days  Time spent: Greater than 50% of the  35 minute visit was spent in counseling/coordination of care for the patient as laid out in the A&P.   Lewie Chamber, MD [facility] 03/10/2020, 2:49 PM

## 2020-03-10 NOTE — Assessment & Plan Note (Signed)
-   right knee/chronic back pain:Symptomatic management

## 2020-03-10 NOTE — Assessment & Plan Note (Signed)
continue PPI

## 2020-03-11 DIAGNOSIS — J9601 Acute respiratory failure with hypoxia: Secondary | ICD-10-CM

## 2020-03-11 DIAGNOSIS — J1282 Pneumonia due to coronavirus disease 2019: Secondary | ICD-10-CM

## 2020-03-11 LAB — URINE CULTURE: Culture: 100 — AB

## 2020-03-11 LAB — COMPREHENSIVE METABOLIC PANEL
ALT: 25 U/L (ref 0–44)
AST: 26 U/L (ref 15–41)
Albumin: 2.5 g/dL — ABNORMAL LOW (ref 3.5–5.0)
Alkaline Phosphatase: 43 U/L (ref 38–126)
Anion gap: 9 (ref 5–15)
BUN: 25 mg/dL — ABNORMAL HIGH (ref 8–23)
CO2: 23 mmol/L (ref 22–32)
Calcium: 8.9 mg/dL (ref 8.9–10.3)
Chloride: 108 mmol/L (ref 98–111)
Creatinine, Ser: 0.67 mg/dL (ref 0.44–1.00)
GFR, Estimated: >60 mL/min (ref >60)
Glucose, Bld: 161 mg/dL — ABNORMAL HIGH (ref 70–99)
Potassium: 3.8 mmol/L (ref 3.5–5.1)
Sodium: 140 mmol/L (ref 135–145)
Total Bilirubin: 0.5 mg/dL (ref 0.3–1.2)
Total Protein: 5.5 g/dL — ABNORMAL LOW (ref 6.5–8.1)

## 2020-03-11 LAB — GLUCOSE, CAPILLARY
Glucose-Capillary: 120 mg/dL — ABNORMAL HIGH (ref 70–99)
Glucose-Capillary: 131 mg/dL — ABNORMAL HIGH (ref 70–99)

## 2020-03-11 LAB — CBC WITH DIFFERENTIAL/PLATELET
Abs Immature Granulocytes: 0.21 10*3/uL — ABNORMAL HIGH (ref 0.00–0.07)
Basophils Absolute: 0 10*3/uL (ref 0.0–0.1)
Basophils Relative: 1 %
Eosinophils Absolute: 0 10*3/uL (ref 0.0–0.5)
Eosinophils Relative: 0 %
HCT: 39.9 % (ref 36.0–46.0)
Hemoglobin: 12.5 g/dL (ref 12.0–15.0)
Immature Granulocytes: 3 %
Lymphocytes Relative: 8 %
Lymphs Abs: 0.7 10*3/uL (ref 0.7–4.0)
MCH: 29.6 pg (ref 26.0–34.0)
MCHC: 31.3 g/dL (ref 30.0–36.0)
MCV: 94.3 fL (ref 80.0–100.0)
Monocytes Absolute: 0.2 10*3/uL (ref 0.1–1.0)
Monocytes Relative: 3 %
Neutro Abs: 7.4 10*3/uL (ref 1.7–7.7)
Neutrophils Relative %: 85 %
Platelets: 162 10*3/uL (ref 150–400)
RBC: 4.23 MIL/uL (ref 3.87–5.11)
RDW: 13.4 % (ref 11.5–15.5)
WBC: 8.5 10*3/uL (ref 4.0–10.5)
nRBC: 0 % (ref 0.0–0.2)

## 2020-03-11 LAB — MAGNESIUM: Magnesium: 2.2 mg/dL (ref 1.7–2.4)

## 2020-03-11 LAB — PROCALCITONIN: Procalcitonin: <0.1 ng/mL

## 2020-03-11 LAB — LACTATE DEHYDROGENASE: LDH: 206 U/L — ABNORMAL HIGH (ref 98–192)

## 2020-03-11 LAB — C-REACTIVE PROTEIN: CRP: 0.6 mg/dL (ref <1.0)

## 2020-03-11 LAB — D-DIMER, QUANTITATIVE: D-Dimer, Quant: 0.48 ug/mL-FEU (ref 0.00–0.50)

## 2020-03-11 MED ORDER — ASPIRIN EC 81 MG PO TBEC: 81.0000 mg | DELAYED_RELEASE_TABLET | Freq: Every day | ORAL | Status: AC

## 2020-03-11 MED ORDER — PREDNISONE 20 MG PO TABS: 40.0000 mg | ORAL_TABLET | Freq: Every day | ORAL | 0 refills | Status: AC

## 2020-03-11 NOTE — Discharge Instructions (Addendum)
Acute Respiratory Failure, Adult Acute respiratory failure is a condition that is a medical emergency. It can develop quickly, and it should be treated right away. There are two types of acute respiratory failure:  Type I respiratory failure is when the lungs are not able to get enough oxygen into the blood. This causes the blood oxygen level to drop.  Type II respiratory failure is when carbon dioxide is not passing from the lungs out of the body. This causes carbon dioxide to build up in the blood. A person may have one type of acute respiratory failure or have both types at the same time. What are the causes? Common causes of type I respiratory failure include:  Trauma to the lung, chest, ribs, or tissues around the lung.  Pneumonia.  Lung diseases, such as pulmonary fibrosis or asthma.  Smoke, chemical, or water inhalation.  A blood clot in the lungs (pulmonary embolism).  A blood infection (sepsis).  Heart attack. Common causes of type II respiratory failure include:  Stroke.  A spinal cord injury.  A drug or alcohol overdose.  A blood infection (sepsis).  Cardiac arrest. What increases the risk? This condition is more likely to develop in people who have:  Lung diseases such as asthma or chronic obstructive pulmonary disease (COPD).  A condition that damages or weakens the muscles, nerves, bones, or tissues that are involved in breathing, such as myasthenia gravis or Guillain-Barr syndrome.  A serious infection.  A health problem that blocks the unconscious reflex that is involved in breathing, such as hypothyroidism or sleep apnea. What are the signs or symptoms? Trouble breathing is the main symptom of acute respiratory failure. Symptoms may also include:  Fast breathing.  Restlessness or anxiety.  Breathing loudly (wheezing) and grunting.  Fast or irregular heartbeats (palpitations).  Confusion or changes in behavior.  Feeling tired (fatigue),  sleeping more than normal, or being hard to wake.  Skin, lips, or fingernails that appear blue (cyanosis). How is this diagnosed? This condition may be diagnosed based on:  Your medical history and a physical exam. Your health care provider will listen to your heart and lungs to check for abnormal sounds.  Tests to confirm the diagnosis and determine the cause of respiratory failure. These tests may include: ? Measuring the amount of oxygen in your blood (pulse oximetry). The measurement comes from a small device that is placed on your finger, earlobe, or toe. ? Blood tests to measure blood oxygen and carbon dioxide and to look for signs of infection. ? Tests on a sample of the fluid that surrounds the spinal cord (cerebrospinal fluid) or a sample of fluid that is drawn from the windpipe (trachea) to check for infections. ? Chest X-ray. ? Electrocardiogram (ECG) to look at the heart's electrical activity.   How is this treated? Treatment for this condition usually takes place in a hospital intensive care unit (ICU). Treatment depends on what is causing the condition. It may include one or more of these treatments:  Oxygen may be given through your nose or a face mask.  A device such as a continuous positive airway pressure (CPAP) machine or bi-level positive airway pressure (BPAP) machine may be used to help you breathe. The device gives you oxygen and pressure.  Breathing treatments, fluids, and other medicines may be given.  A ventilator may be used to help you breathe. The machine gives you oxygen and pressure. A tube is put into your mouth and trachea to connect the   ventilator. ? If this treatment is needed longer term, a tracheostomy may be placed. A tracheostomy is a breathing tube put through your neck into your trachea.  In extreme cases, extracorporeal life support (ECLS) may be used. This treatment temporarily takes over the function of the heart and lungs, supplying oxygen and  removing carbon dioxide. ECLS gives the lungs a chance to recover. Follow these instructions at home: Medicines  Take over-the-counter and prescription medicines only as told by your health care provider.  If you were prescribed an antibiotic medicine, take it as told by your health care provider. Do not stop using the antibiotic even if you start to feel better.  If you are taking blood thinners: ? Talk with your health care provider before you take any medicines that contain aspirin or NSAIDs, such as ibuprofen. These medicines increase your risk for dangerous bleeding. ? Take your medicine exactly as told, at the same time every day. ? Avoid activities that could cause injury or bruising, and follow instructions about how to prevent falls. ? Wear a medical alert bracelet or carry a card that lists what medicines you take. General instructions  Return to your normal activities as told by your health care provider. Ask your health care provider what activities are safe for you.  Do not use any products that contain nicotine or tobacco, such as cigarettes, e-cigarettes, and chewing tobacco. If you need help quitting, ask your health care provider.  Do not drink alcohol if: ? Your health care provider tells you not to drink. ? You are pregnant, may be pregnant, or are planning to become pregnant.  Wear compression stockings as told by your health care provider. These stockings help to prevent blood clots and reduce swelling in your legs.  Attend any physical therapy and pulmonary rehabilitation as told by your health care provider.  Keep all follow-up visits as told by your health care provider. This is important. How is this prevented?  If you have an infection or a medical condition that may lead to acute respiratory failure, make sure you get proper treatment. Contact a health care provider if:  You have a fever.  Your symptoms do not improve or they get worse. Get help right  away if:  You are having trouble breathing.  You lose consciousness.  You develop a fast heart rate.  Your fingers, lips, or other areas turn blue.  You are confused. These symptoms may represent a serious problem that is an emergency. Do not wait to see if the symptoms will go away. Get medical help right away. Call your local emergency services (911 in the [place] Do not drive yourself to the hospital. Summary  Acute respiratory failure is a medical emergency. It can develop quickly, and it should be treated right away.  Treatment for this condition usually takes place in a hospital intensive care unit (ICU). Treatment may include oxygen, fluids, and medicines. A device may be used to help you breathe, such as a ventilator.  Take over-the-counter and prescription medicines only as told by your health care provider.  Contact a health care provider if your symptoms do not improve or if they get worse. This information is not intended to replace advice given to you by your health care provider. Make sure you discuss any questions you have with your health care provider. Document Revised: 12/25/2018 Document Reviewed: 12/25/2018 Elsevier Patient Education  2021 [employer]  

## 2020-03-11 NOTE — Progress Notes (Signed)
SATURATION QUALIFICATIONS: (This note is used to comply with regulatory documentation for home oxygen)  Patient Saturations on Room Air at Rest = 94%  Patient Saturations on Room Air while Ambulating = 84%  Patient Saturations on 2 Liters of oxygen while Ambulating = 90%  Please briefly explain why patient needs home oxygen: Patient would benefit from home oxygen to keep 02 sats >86% while ambulating

## 2020-03-11 NOTE — TOC Initial Note (Signed)
Transition of Care [facility]) - Initial/Assessment Note    Patient Details  Name: Rachel [name] MRN: [id number] Date of Birth: [birth year]  Transition of Care (TOC) CM/SW Contact:    Armanda Heritage, RN Phone Number: 03/11/2020, 1:28 PM  Clinical Narrative:                 [employer] to deliver home oxygen.  Expected Discharge Plan: Home/Self Care Barriers to Discharge: No Barriers Identified   Patient Goals and CMS Choice Patient states their goals for this hospitalization and ongoing recovery are:: to go home      Expected Discharge Plan and Services Expected Discharge Plan: Home/Self Care         Expected Discharge Date: 03/11/20               DME Arranged: Oxygen DME Agency: Other - Comment Loyal Buba) Date DME Agency Contacted: 03/11/20 Time DME Agency Contacted: [phone] Representative spoke with at DME Agency: Vaughan Basta            Prior Living Arrangements/Services       Do you feel safe going back to the place where you live?: Yes               Activities of Daily Living Home Assistive Devices/Equipment: Grab bars around toilet,Raised toilet seat with rails ADL Screening (condition at time of admission) Patient's cognitive ability adequate to safely complete daily activities?: Yes Is the patient deaf or have difficulty hearing?: No Does the patient have difficulty seeing, even when wearing glasses/contacts?: No Does the patient have difficulty concentrating, remembering, or making decisions?: No Patient able to express need for assistance with ADLs?: Yes Does the patient have difficulty dressing or bathing?: No Independently performs ADLs?: Yes (appropriate for developmental age) Does the patient have difficulty walking or climbing stairs?: No Weakness of Legs: None Weakness of Arms/Hands: None  Permission Sought/Granted                  Emotional Assessment           Psych Involvement: No (comment)  Admission diagnosis:  Pneumonia  [J18.9] SOB (shortness of breath) [R06.02] Acute respiratory failure due to COVID-19 (HCC) [U07.1, J96.00] COVID-19 [U07.1] Patient Active Problem List   Diagnosis Date Noted  . Acute respiratory failure with hypoxia (HCC) 03/10/2020  . Hyperglycemia 03/10/2020  . Pneumonia due to COVID-19 virus 03/05/2020  . Fatty liver 03/05/2020  . Gastro-esophageal reflux disease without esophagitis 03/05/2020  . Hypothyroidism 03/05/2020  . Pneumonia 03/05/2020  . COVID-19 03/05/2020  . Allergic rhinitis 10/13/2017  . Allergy to insect bites and stings 10/13/2017  . Tinea 10/13/2017  . History of wheezing 10/13/2017  . Obstructive sleep apnea syndrome 08/18/2016  . Morbid obesity (HCC) 05/28/2016  . History of total knee arthroplasty 01/25/2015  . Osteoarthritis of right knee 06/08/2013  . Lateral meniscus tear, current 06/08/2013   PCP:  Sigmund Hazel, MD Pharmacy:   [facility] [address] [place] - [address] AT [facility] [address] [place] [address] Phone: [phone] Fax: [phone]     Social Determinants of Health (SDOH) Interventions    Readmission Risk Interventions No flowsheet data found.

## 2020-03-11 NOTE — Discharge Summary (Signed)
Physician Discharge Summary   Rachel [name]:[id number] DOB: 1955-07-08 DOA: 03/05/2020  PCP: Sigmund Hazel, MD  Admit date: 03/05/2020 Discharge date: 03/11/2020  Admitted From: home Disposition:  home Discharging physician: Lewie Chamber, MD  Recommendations for Outpatient Follow-up:  1. Completion of steroid course 2. Wean oxygen to off   Patient discharged to home in Discharge Condition: stable Risk of unplanned readmission score: Unplanned Admission- Pilot do not use: 10.85  CODE STATUS: Full Diet recommendation:  Diet Orders (From admission, onward)    Start     Ordered   03/11/20 0000  Diet - low sodium heart healthy        03/11/20 1311   03/06/20 0816  Diet Heart Room service appropriate? Yes; Fluid consistency: Thin  Diet effective now       Question Answer Comment  Room service appropriate? Yes   Fluid consistency: Thin      03/06/20 0815          Hospital Course: Rachel [name] is a 65 y.o. female with medical history significant for morbid obesity, OSA, GERD, history of allergic rhinitis, asthma when she was younger, vaccinated for COVID-19 in April 2021, osteoarthritis/back pain, hypothyroidism presents to the ED for evaluation of worsening shortness of breath. Patient reported she was exposed to COVID-19 patient then developed sore throat and fever.  She developed further worsening of cough, weakness, shortness of breath and was given molnupiravir outpatient.  Due to no relief she presented for further evaluation. She was found to be hypoxic and admitted for further work-up and treatment.  CXR on admission showed infiltrates, notably worse on the left.  There was suspicion for superimposed bacterial infection and she was initiated on Rocephin and azithromycin.  She was continued on steroids and completed course of remdesivir during hospitalization.  Antibiotic course was extended for a total of 7 days.  She was discharged on a further course of prednisone to  complete at discharge as well. She required oxygen that was escalated up to salter high flow then able to be slowly weaned as inflammatory markers also down trended reassuringly.  She underwent a walk test prior to discharge and required 2 L nasal cannula oxygen which was arranged for home use at discharge.  She was clinically stable and much improved and considered stable for discharging home which she was amenable to as well.   * Pneumonia due to COVID-19 virus -Continue weaning oxygen as able -CXR reviewed, left-sided opacity appreciated concerning for probable superimposed bacterial pneumonia -Continue Rocephin and azithromycin; extend for total of 7 day course - s/p 5 days remdesivir - continue solu-medrol; transitioned to prednisone at discharge to complete course -Inflammatory markers do continue to downtrend reassuringly -Follow-up sputum culture, still in process; polymicrobial, possibly normal flora -Strep pneumo urinary antigen and Legionella urinary antigen both negative -Continue incentive spirometer, flutter, out of bed as able  Acute respiratory failure with hypoxia (HCC) - 2/2 covid PNA - continue weaning O2 as able: Discharged on 2 L nasal cannula - continue IS  Hyperglycemia - continue SSI while hospitalized - check A1c, 5.6%  Morbid obesity (HCC) - Counseling on weight loss strategy outpatient follow-up once medically stable. Does not use cpap at home.  Hypothyroidism Resume home Synthroid dose.  Gastro-esophageal reflux disease without esophagitis - continue PPI  Allergic rhinitis as a kid, controlled with nebs/rescue inhaler at home  Osteoarthritis of right knee - right knee/chronic back pain:Symptomatic management    The patient's chronic medical conditions were treated  accordingly per the patient's home medication regimen except as noted.  On day of discharge, patient was felt deemed stable for discharge. Patient/family member advised to call PCP or  come back to ER if needed.   Principal Diagnosis: Pneumonia due to COVID-19 virus  Discharge Diagnoses: Active Hospital Problems   Diagnosis Date Noted  . Pneumonia due to COVID-19 virus 03/05/2020    Priority: High  . Acute respiratory failure with hypoxia (HCC) 03/10/2020    Priority: High  . Hyperglycemia 03/10/2020    Priority: Low  . Hypothyroidism 03/05/2020  . Gastro-esophageal reflux disease without esophagitis 03/05/2020  . Allergic rhinitis 10/13/2017  . Morbid obesity (HCC) 05/28/2016  . Osteoarthritis of right knee 06/08/2013    Resolved Hospital Problems  No resolved problems to display.    Discharge Instructions    Diet - low sodium heart healthy   Complete by: As directed    Increase activity slowly   Complete by: As directed    No wound care   Complete by: As directed      Allergies as of 03/11/2020      Reactions   Bee Venom Anaphylaxis   Codeine Itching   Guaifenesin & Derivatives Anaphylaxis   Molds & Smuts Shortness Of Breath   Nose will burn   Pollen Extract    Other reaction(s): Eye Irritation Itchy Eyes, Burning Eyes, Sneezing      Medication List    STOP taking these medications   HYDROcodone-acetaminophen 5-325 MG tablet Commonly known as: NORCO/VICODIN   lidocaine 5 % Commonly known as: Lidoderm   methocarbamol 500 MG tablet Commonly known as: ROBAXIN   oxyCODONE-acetaminophen 5-325 MG tablet Commonly known as: Percocet     TAKE these medications   acetaminophen 500 MG tablet Commonly known as: TYLENOL Take 1,000 mg by mouth every 6 (six) hours as needed for mild pain.   albuterol (2.5 MG/3ML) 0.083% nebulizer solution Commonly known as: PROVENTIL Inhale 3 mLs into the lungs every 6 (six) hours as needed for shortness of breath.   albuterol 108 (90 Base) MCG/ACT inhaler Commonly known as: VENTOLIN HFA Inhale 1 puff into the lungs every 6 (six) hours as needed for wheezing or shortness of breath.   aspirin EC 81 MG  tablet Take 1 tablet (81 mg total) by mouth daily for 14 days. Swallow whole.   atorvastatin 10 MG tablet Commonly known as: LIPITOR Take 10 mg by mouth daily.   calcium citrate-vitamin D 500-500 MG-UNIT chewable tablet Chew 2 tablets by mouth daily.   docusate sodium 100 MG capsule Commonly known as: COLACE Take 100 mg by mouth 2 (two) times daily.   EPINEPHrine 0.3 mg/0.3 mL Soaj injection Commonly known as: EPI-PEN Inject 0.3 mg into the muscle as needed for anaphylaxis.   fexofenadine 180 MG tablet Commonly known as: ALLEGRA Take 180 mg by mouth daily.   Flovent HFA 110 MCG/ACT inhaler Generic drug: fluticasone Inhale 1 puff into the lungs 2 (two) times daily.   fluticasone 50 MCG/ACT nasal spray Commonly known as: FLONASE Place 2 sprays into both nostrils at bedtime.   gabapentin 300 MG capsule Commonly known as: NEURONTIN Take 1,200 mg by mouth at bedtime.   ibuprofen 200 MG tablet Commonly known as: ADVIL Take 200 mg by mouth every 6 (six) hours as needed for moderate pain or headache.   levothyroxine 137 MCG tablet Commonly known as: SYNTHROID Take 137 mcg by mouth daily before breakfast.   Melatonin 10 MG Tabs Take 10 mg  by mouth at bedtime.   multivitamin tablet Take 1 tablet by mouth daily. Celebrity nuitriional supplement multiple vitaman complete chews   pantoprazole 40 MG tablet Commonly known as: PROTONIX Take 40 mg by mouth daily.   predniSONE 20 MG tablet Commonly known as: DELTASONE Take 2 tablets (40 mg total) by mouth daily for 8 days.            Durable Medical Equipment  (From admission, onward)         Start     Ordered   03/11/20 1312  DME Oxygen  Once       Question Answer Comment  Length of Need 6 Months   Mode or (Route) Nasal cannula   Liters per Minute 2   Oxygen delivery system Gas      03/11/20 1311          Allergies  Allergen Reactions  . Bee Venom Anaphylaxis  . Codeine Itching  . Guaifenesin &  Derivatives Anaphylaxis  . Molds & Smuts Shortness Of Breath    Nose will burn  . Pollen Extract     Other reaction(s): Eye Irritation Itchy Eyes, Burning Eyes, Sneezing    Consultations:   Discharge Exam: BP (!) 131/93   Pulse 94   Temp 97.7 F (36.5 C) (Axillary)   Resp 18   Ht  (1.676 m)   Wt 115 kg   LMP 06/08/2009   SpO2 90%   BMI 40.92 kg/m  General appearance: alert, cooperative and no distress Head: Normocephalic, without obvious abnormality, atraumatic Eyes: EOMI Lungs: mostly CTABL Heart: regular rate and rhythm and S1, S2 normal Abdomen: normal findings: bowel sounds normal and soft, non-tender Extremities: no edema Skin: mobility and turgor normal Neurologic: Grossly normal  The results of significant diagnostics from this hospitalization (including imaging, microbiology, ancillary and laboratory) are listed below for reference.   Microbiology: Recent Results (from the past 240 hour(s))  Resp Panel by RT-PCR (Flu A&B, Covid) Nasopharyngeal Swab     Status: Abnormal   Collection Time: 03/05/20  4:14 PM   Specimen: Nasopharyngeal Swab; Nasopharyngeal(NP) swabs in vial transport medium  Result Value Ref Range Status   SARS Coronavirus 2 by RT PCR POSITIVE (A) NEGATIVE Final    Comment: RESULT CALLED TO, READ BACK BY AND VERIFIED WITH: BOWEN,M RN  ON 03/05/20 JACKSON,K (NOTE) SARS-CoV-2 target nucleic acids are DETECTED.  The SARS-CoV-2 RNA is generally detectable in upper respiratory specimens during the acute phase of infection. Positive results are indicative of the presence of the identified virus, but do not rule out bacterial infection or co-infection with other pathogens not detected by the test. Clinical correlation with patient history and other diagnostic information is necessary to determine patient infection status. The expected result is Negative.  Fact Sheet for Patients: BloggerCourse.com  Fact Sheet for  Healthcare Providers: SeriousBroker.it  This test is not yet approved or cleared by the [place] FDA and  has been authorized for detection and/or diagnosis of SARS-CoV-2 by FDA under an Emergency Use Authorization (EUA).  This EUA will remain in effect (meaning this test ca n be used) for the duration of  the COVID-19 declaration under Section 564(b)(1) of the Act, 21 U.S.C. section 360bbb-3(b)(1), unless the authorization is terminated or revoked sooner.     Influenza A by PCR NEGATIVE NEGATIVE Final   Influenza B by PCR NEGATIVE NEGATIVE Final    Comment: (NOTE) The Xpert Xpress SARS-CoV-2/FLU/RSV plus assay is intended as an aid in  the diagnosis of influenza from Nasopharyngeal swab specimens and should not be used as a sole basis for treatment. Nasal washings and aspirates are unacceptable for Xpert Xpress SARS-CoV-2/FLU/RSV testing.  Fact Sheet for Patients: BloggerCourse.com  Fact Sheet for Healthcare Providers: SeriousBroker.it  This test is not yet approved or cleared by the [place] FDA and has been authorized for detection and/or diagnosis of SARS-CoV-2 by FDA under an Emergency Use Authorization (EUA). This EUA will remain in effect (meaning this test can be used) for the duration of the COVID-19 declaration under Section 564(b)(1) of the Act, 21 U.S.C. section 360bbb-3(b)(1), unless the authorization is terminated or revoked.  Performed at [facility], [address], [place], [place] [address]   Blood Culture (routine x 2)     Status: None   Collection Time: 03/05/20  4:19 PM   Specimen: BLOOD  Result Value Ref Range Status   Specimen Description   Final    BLOOD RIGHT ANTECUBITAL Performed at [facility], [address], [place], [place] [address]    Special Requests   Final    BOTTLES DRAWN AEROBIC AND ANAEROBIC Blood Culture  adequate volume Performed at [facility], [address], [place], [place] [address]    Culture   Final    NO GROWTH 5 DAYS Performed at [facility], [address] [place], [place] [address]    Report Status 03/10/2020 FINAL  Final  Blood Culture (routine x 2)     Status: None   Collection Time: 03/05/20  5:05 PM   Specimen: BLOOD  Result Value Ref Range Status   Specimen Description   Final    BLOOD LEFT ANTECUBITAL Performed at [facility], [address], [place], [place] [address]    Special Requests   Final    BOTTLES DRAWN AEROBIC AND ANAEROBIC Blood Culture adequate volume Performed at [facility], [address], [place], [place] [address]    Culture   Final    NO GROWTH 5 DAYS Performed at [facility], [address], [place], [place] [address]    Report Status 03/10/2020 FINAL  Final  Urine culture     Status: Abnormal   Collection Time: 03/06/20  5:32 PM   Specimen: In/Out Cath Urine  Result Value Ref Range Status   Specimen Description   Final    IN/OUT CATH URINE Performed at [facility], [address], [place], [place] [address]    Special Requests   Final    NONE Performed at [facility], [address], [place], [place] [address]    Culture 100 COLONIES/mL STAPHYLOCOCCUS EPIDERMIDIS (A)  Final   Report Status 03/11/2020 FINAL  Final   Organism ID, Bacteria STAPHYLOCOCCUS EPIDERMIDIS (A)  Final      Susceptibility   Staphylococcus epidermidis - MIC*    CIPROFLOXACIN <=0.5 SENSITIVE Sensitive     GENTAMICIN <=0.5 SENSITIVE Sensitive     NITROFURANTOIN <=16 SENSITIVE Sensitive     OXACILLIN >=4 RESISTANT Resistant     TETRACYCLINE 2 SENSITIVE Sensitive     VANCOMYCIN 1 SENSITIVE Sensitive     TRIMETH/SULFA <=10 SENSITIVE Sensitive     CLINDAMYCIN >=8 RESISTANT Resistant     RIFAMPIN <=0.5 SENSITIVE Sensitive     Inducible Clindamycin  NEGATIVE Sensitive     * 100 COLONIES/mL STAPHYLOCOCCUS EPIDERMIDIS  Expectorated Sputum Assessment w Gram Stain, Rflx to Resp Cult  Status: None   Collection Time: 03/06/20  5:32 PM   Specimen: Sputum  Result Value Ref Range Status   Specimen Description SPU  Final   Special Requests NONE  Final   Sputum evaluation   Final    THIS SPECIMEN IS ACCEPTABLE FOR SPUTUM CULTURE Performed at [facility], [address] [place], [place] [address]    Report Status 03/06/2020 FINAL  Final  Culture, Respiratory w Gram Stain     Status: None   Collection Time: 03/06/20  5:32 PM   Specimen: Sputum  Result Value Ref Range Status   Specimen Description   Final    SPU Performed at [facility], [address] [place], [place] [address]    Special Requests   Final    NONE Reflexed from [id number] Performed at [facility], [address], [place], [place] [address]    Gram Stain   Final    ABUNDANT WBC PRESENT, PREDOMINANTLY PMN FEW SQUAMOUS EPITHELIAL CELLS PRESENT ABUNDANT GRAM POSITIVE COCCI ABUNDANT YEAST FEW GRAM POSITIVE RODS RARE GRAM NEGATIVE RODS    Culture   Final    FEW Normal respiratory flora-no Staph aureus or Pseudomonas seen Performed at [facility], [address], [place], [place] [address]    Report Status 03/09/2020 FINAL  Final     Labs: BNP (last 3 results) No results for input(s): BNP in the last 8760 hours. Basic Metabolic Panel: Recent Labs  Lab 03/07/20 0349 03/08/20 0356 03/09/20 0341 03/10/20 0332 03/11/20 0210  NA 142 146* 142 142 140  K 5.0 4.1 4.1 3.8 3.8  CL 109 110 109 109 108  CO2 GLUCOSE 142* 146* 148* 153* 161*  BUN 26* 25*  CREATININE 0.83 0.93 0.74 0.69 0.67  CALCIUM 9.1 9.2 9.1 8.8* 8.9  MG  --   --  1.9 2.2 2.2   Liver Function Tests: Recent Labs  Lab 03/07/20 0349 03/08/20 0356 03/09/20 0341 03/10/20 0332 03/11/20 0210  AST 46* 47*  43* 32 26  ALT ALKPHOS 51 55 47 42 43  BILITOT 0.5 0.5 0.6 0.6 0.5  PROT 6.6 6.7 6.0* 5.6* 5.5*  ALBUMIN 2.8* 2.8* 2.6* 2.4* 2.5*   No results for input(s): LIPASE, AMYLASE in the last 168 hours. No results for input(s): AMMONIA in the last 168 hours. CBC: Recent Labs  Lab 03/07/20 0349 03/08/20 0356 03/09/20 0341 03/10/20 0332 03/11/20 0210  WBC 3.1* 7.6 8.1 8.4 8.5  NEUTROABS 2.0 5.9 6.4 7.2 7.4  HGB 12.7 12.8 12.8 12.2 12.5  HCT 41.4 40.1 41.4 38.2 39.9  MCV 96.3 93.9 96.3 94.6 94.3  PLT 132* 184 182 166 162   Cardiac Enzymes: No results for input(s): CKTOTAL, CKMB, CKMBINDEX, TROPONINI in the last 168 hours. BNP: Invalid input(s): POCBNP CBG: Recent Labs  Lab 03/10/20 1140 03/10/20 1630 03/10/20 2033 03/11/20 0729 03/11/20 1248  GLUCAP 130* 192* 166* 120* 131*   D-Dimer Recent Labs    03/10/20 0332 03/11/20 0210  DDIMER 0.49 0.48   Hgb A1c No results for input(s): HGBA1C in the last 72 hours. Lipid Profile No results for input(s): CHOL, HDL, LDLCALC, TRIG, CHOLHDL, LDLDIRECT in the last 72 hours. Thyroid function studies No results for input(s): TSH, T4TOTAL, T3FREE, THYROIDAB in the last 72 hours.  Invalid input(s): FREET3 Anemia work up No results for input(s): VITAMINB12, FOLATE, FERRITIN, TIBC, IRON, RETICCTPCT in  the last 72 hours. Urinalysis    Component Value Date/Time   COLORURINE YELLOW 03/06/2020 1732   APPEARANCEUR CLEAR 03/06/2020 1732   LABSPEC 1.006 03/06/2020 1732   PHURINE 5.0 03/06/2020 1732   GLUCOSEU NEGATIVE 03/06/2020 1732   HGBUR NEGATIVE 03/06/2020 1732   BILIRUBINUR NEGATIVE 03/06/2020 1732   KETONESUR NEGATIVE 03/06/2020 1732   PROTEINUR NEGATIVE 03/06/2020 1732   NITRITE NEGATIVE 03/06/2020 1732   LEUKOCYTESUR NEGATIVE 03/06/2020 1732   Sepsis Labs Invalid input(s): PROCALCITONIN,  WBC,  LACTICIDVEN Microbiology Recent Results (from the past 240 hour(s))  Resp Panel by RT-PCR (Flu A&B, Covid)  Nasopharyngeal Swab     Status: Abnormal   Collection Time: 03/05/20  4:14 PM   Specimen: Nasopharyngeal Swab; Nasopharyngeal(NP) swabs in vial transport medium  Result Value Ref Range Status   SARS Coronavirus 2 by RT PCR POSITIVE (A) NEGATIVE Final    Comment: RESULT CALLED TO, READ BACK BY AND VERIFIED WITH: BOWEN,M RN @1748  ON 03/05/20 JACKSON,K (NOTE) SARS-CoV-2 target nucleic acids are DETECTED.  The SARS-CoV-2 RNA is generally detectable in upper respiratory specimens during the acute phase of infection. Positive results are indicative of the presence of the identified virus, but do not rule out bacterial infection or co-infection with other pathogens not detected by the test. Clinical correlation with patient history and other diagnostic information is necessary to determine patient infection status. The expected result is Negative.  Fact Sheet for Patients: 03/07/20  Fact Sheet for Healthcare Providers: BloggerCourse.com  This test is not yet approved or cleared by the [place] FDA and  has been authorized for detection and/or diagnosis of SARS-CoV-2 by FDA under an Emergency Use Authorization (EUA).  This EUA will remain in effect (meaning this test ca n be used) for the duration of  the COVID-19 declaration under Section 564(b)(1) of the Act, 21 U.S.C. section 360bbb-3(b)(1), unless the authorization is terminated or revoked sooner.     Influenza A by PCR NEGATIVE NEGATIVE Final   Influenza B by PCR NEGATIVE NEGATIVE Final    Comment: (NOTE) The Xpert Xpress SARS-CoV-2/FLU/RSV plus assay is intended as an aid in the diagnosis of influenza from Nasopharyngeal swab specimens and should not be used as a sole basis for treatment. Nasal washings and aspirates are unacceptable for Xpert Xpress SARS-CoV-2/FLU/RSV testing.  Fact Sheet for Patients: [place]  Fact Sheet for  Healthcare Providers: BloggerCourse.com  This test is not yet approved or cleared by the [place]ious[place] FDA and has been authorized for detection and/or diagnosis of SARS-CoV-2 by FDA under an Emergency Use Authorization (EUA). This EUA will remain in effect (meaning this test can be used) for the duration of the COVID-19 declaration under Section 564(b)(1) of the Act, 21 U.S.C. section 360bbb-3(b)(1), unless the authorization is terminated or revoked.  Performed at [facility], [address] [place], [place]   Blood Culture (routine x 2)     Status: None   Collection Time: 03/05/20  4:19 PM   Specimen: BLOOD  Result Value Ref Range Status   Specimen Description   Final    BLOOD RIGHT ANTECUBITAL Performed at [facility], [address] [place], [place]    Special Requests   Final    BOTTLES DRAWN AEROBIC AND ANAEROBIC Blood Culture adequate volume Performed at [facility], [address], [place], [place]    Culture   Final    NO GROWTH 5 DAYS Performed at [facility], [address] [place], [place] [address]    Report Status 03/10/2020 FINAL  Final  Blood Culture (routine x 2)     Status: None   Collection Time: 03/05/20  5:05 PM   Specimen: BLOOD  Result Value Ref Range Status   Specimen Description   Final    BLOOD LEFT ANTECUBITAL Performed at [facility], [address] [place], [place] [address]    Special Requests   Final    BOTTLES DRAWN AEROBIC AND ANAEROBIC Blood Culture adequate volume Performed at [facility], [address] [place], [place] [address]    Culture   Final    NO GROWTH 5 DAYS Performed at [facility], [address] [place], [place] [address]    Report Status 03/10/2020 FINAL  Final  Urine culture     Status: Abnormal   Collection Time: 03/06/20  5:32 PM   Specimen:  In/Out Cath Urine  Result Value Ref Range Status   Specimen Description   Final    IN/OUT CATH URINE Performed at [facility], [address] [place], [place] [address]    Special Requests   Final    NONE Performed at [facility], [address] [place], [place] [address]    Culture 100 COLONIES/mL STAPHYLOCOCCUS EPIDERMIDIS (A)  Final   Report Status 03/11/2020 FINAL  Final   Organism ID, Bacteria STAPHYLOCOCCUS EPIDERMIDIS (A)  Final      Susceptibility   Staphylococcus epidermidis - MIC*    CIPROFLOXACIN <=0.5 SENSITIVE Sensitive     GENTAMICIN <=0.5 SENSITIVE Sensitive     NITROFURANTOIN <=16 SENSITIVE Sensitive     OXACILLIN >=4 RESISTANT Resistant     TETRACYCLINE 2 SENSITIVE Sensitive     VANCOMYCIN 1 SENSITIVE Sensitive     TRIMETH/SULFA <=10 SENSITIVE Sensitive     CLINDAMYCIN >=8 RESISTANT Resistant     RIFAMPIN <=0.5 SENSITIVE Sensitive     Inducible Clindamycin NEGATIVE Sensitive     * 100 COLONIES/mL STAPHYLOCOCCUS EPIDERMIDIS  Expectorated Sputum Assessment w Gram Stain, Rflx to Resp Cult     Status: None   Collection Time: 03/06/20  5:32 PM   Specimen: Sputum  Result Value Ref Range Status   Specimen Description SPU  Final   Special Requests NONE  Final   Sputum evaluation   Final    THIS SPECIMEN IS ACCEPTABLE FOR SPUTUM CULTURE Performed at [facility], [address] [place], [place] [address]    Report Status 03/06/2020 FINAL  Final  Culture, Respiratory w Gram Stain     Status: None   Collection Time: 03/06/20  5:32 PM   Specimen: Sputum  Result Value Ref Range Status   Specimen Description   Final    SPU Performed at [facility], [address] [place], [place] [address]    Special Requests   Final    NONE Reflexed from [id number] Performed at [facility], [address] [place], [place] [address]    Gram Stain   Final    ABUNDANT WBC PRESENT,  PREDOMINANTLY PMN FEW SQUAMOUS EPITHELIAL CELLS PRESENT ABUNDANT GRAM POSITIVE COCCI ABUNDANT YEAST FEW GRAM POSITIVE RODS RARE GRAM NEGATIVE RODS    Culture   Final    FEW Normal respiratory flora-no Staph aureus or Pseudomonas seen Performed at [facility], [address] [place], [place] [address]    Report Status 03/09/2020 FINAL  Final  Procedures/Studies: DG Chest Port 1 View  Result Date: 03/05/2020 CLINICAL DATA:  COVID-19 positive with cough and shortness of breath EXAM: PORTABLE CHEST 1 VIEW COMPARISON:  January 12, 2015 FINDINGS: There is airspace opacity throughout much of the left lung. Right lung is clear. Heart size and pulmonary vascularity are normal. No adenopathy. No bone lesions. IMPRESSION: Airspace opacity consistent with pneumonia throughout much of the left lung. Suspect multifocal pneumonia, likely of atypical organism etiology. Right lung clear. Cardiac silhouette within normal limits. Electronically Signed   By: Bretta Bang III M.D.   On: 03/05/2020 14:10     Time coordinating discharge: Over 30 minutes    Lewie Chamber, MD  [facility] 03/11/2020, 3:21 PM

## 2020-03-11 NOTE — Plan of Care (Signed)
  Problem: Education: Goal: Knowledge of General Education information will improve Description: Including pain rating scale, medication(s)/side effects and non-pharmacologic comfort measures 03/11/2020 1455 by Bradley Ferris, RN Outcome: Adequate for Discharge 03/11/2020 1455 by Bradley Ferris, RN Outcome: Adequate for Discharge   Problem: Health Behavior/Discharge Planning: Goal: Ability to manage health-related needs will improve 03/11/2020 1455 by Bradley Ferris, RN Outcome: Adequate for Discharge 03/11/2020 1455 by Bradley Ferris, RN Outcome: Adequate for Discharge   Problem: Clinical Measurements: Goal: Ability to maintain clinical measurements within normal limits will improve 03/11/2020 1455 by Bradley Ferris, RN Outcome: Adequate for Discharge 03/11/2020 1455 by Bradley Ferris, RN Outcome: Adequate for Discharge Goal: Will remain free from infection 03/11/2020 1455 by Bradley Ferris, RN Outcome: Adequate for Discharge 03/11/2020 1455 by Bradley Ferris, RN Outcome: Adequate for Discharge Goal: Diagnostic test results will improve 03/11/2020 1455 by Bradley Ferris, RN Outcome: Adequate for Discharge 03/11/2020 1455 by Bradley Ferris, RN Outcome: Adequate for Discharge Goal: Respiratory complications will improve 03/11/2020 1455 by Bradley Ferris, RN Outcome: Adequate for Discharge 03/11/2020 1455 by Bradley Ferris, RN Outcome: Adequate for Discharge Goal: Cardiovascular complication will be avoided 03/11/2020 1455 by Bradley Ferris, RN Outcome: Adequate for Discharge 03/11/2020 1455 by Bradley Ferris, RN Outcome: Adequate for Discharge   Problem: Respiratory: Goal: Will maintain a patent airway 03/11/2020 1455 by Bradley Ferris, RN Outcome: Adequate for Discharge 03/11/2020 1455 by Bradley Ferris, RN Outcome: Adequate for Discharge Goal: Complications related to the disease process, condition or treatment will be avoided or minimized 03/11/2020 1455 by Bradley Ferris, RN Outcome: Adequate for Discharge 03/11/2020 1455 by Bradley Ferris,  RN Outcome: Adequate for Discharge   Problem: Coping: Goal: Psychosocial and spiritual needs will be supported 03/11/2020 1455 by Bradley Ferris, RN Outcome: Adequate for Discharge 03/11/2020 1455 by Bradley Ferris, RN Outcome: Adequate for Discharge   Problem: Education: Goal: Knowledge of risk factors and measures for prevention of condition will improve 03/11/2020 1455 by Bradley Ferris, RN Outcome: Adequate for Discharge 03/11/2020 1455 by Bradley Ferris, RN Outcome: Adequate for Discharge   Problem: Skin Integrity: Goal: Risk for impaired skin integrity will decrease 03/11/2020 1455 by Bradley Ferris, RN Outcome: Adequate for Discharge 03/11/2020 1455 by Bradley Ferris, RN Outcome: Adequate for Discharge   Problem: Safety: Goal: Ability to remain free from injury will improve 03/11/2020 1455 by Bradley Ferris, RN Outcome: Adequate for Discharge 03/11/2020 1455 by Bradley Ferris, RN Outcome: Adequate for Discharge

## 2020-03-16 ENCOUNTER — Ambulatory Visit: Payer: BC Managed Care – PPO | Admitting: Neurology

## 2020-03-17 DIAGNOSIS — U071 COVID-19: Secondary | ICD-10-CM | POA: Diagnosis not present

## 2020-03-17 DIAGNOSIS — J4530 Mild persistent asthma, uncomplicated: Secondary | ICD-10-CM | POA: Diagnosis not present

## 2020-03-17 DIAGNOSIS — J1282 Pneumonia due to coronavirus disease 2019: Secondary | ICD-10-CM | POA: Diagnosis not present

## 2020-03-24 DIAGNOSIS — J1282 Pneumonia due to coronavirus disease 2019: Secondary | ICD-10-CM | POA: Diagnosis not present

## 2020-03-24 DIAGNOSIS — F5101 Primary insomnia: Secondary | ICD-10-CM | POA: Diagnosis not present

## 2020-03-24 DIAGNOSIS — E6609 Other obesity due to excess calories: Secondary | ICD-10-CM | POA: Diagnosis not present

## 2020-03-24 DIAGNOSIS — U071 COVID-19: Secondary | ICD-10-CM | POA: Diagnosis not present

## 2020-03-24 DIAGNOSIS — J4530 Mild persistent asthma, uncomplicated: Secondary | ICD-10-CM | POA: Diagnosis not present

## 2020-04-21 DIAGNOSIS — R0602 Shortness of breath: Secondary | ICD-10-CM | POA: Diagnosis not present

## 2020-04-21 DIAGNOSIS — R5383 Other fatigue: Secondary | ICD-10-CM | POA: Diagnosis not present

## 2020-04-21 DIAGNOSIS — J1282 Pneumonia due to coronavirus disease 2019: Secondary | ICD-10-CM | POA: Diagnosis not present

## 2020-04-21 DIAGNOSIS — Z6841 Body Mass Index (BMI) 40.0 and over, adult: Secondary | ICD-10-CM | POA: Diagnosis not present

## 2020-05-09 ENCOUNTER — Telehealth: Payer: Self-pay | Admitting: Neurology

## 2020-05-09 ENCOUNTER — Encounter: Payer: Self-pay | Admitting: Neurology

## 2020-05-09 ENCOUNTER — Ambulatory Visit (INDEPENDENT_AMBULATORY_CARE_PROVIDER_SITE_OTHER): Payer: Medicare HMO | Admitting: Neurology

## 2020-05-09 VITALS — BP 121/83 | HR 66 | Ht 66.0 in | Wt 249.0 lb

## 2020-05-09 DIAGNOSIS — G4733 Obstructive sleep apnea (adult) (pediatric): Secondary | ICD-10-CM | POA: Diagnosis not present

## 2020-05-09 DIAGNOSIS — G4761 Periodic limb movement disorder: Secondary | ICD-10-CM

## 2020-05-09 DIAGNOSIS — Z9884 Bariatric surgery status: Secondary | ICD-10-CM

## 2020-05-09 DIAGNOSIS — G2581 Restless legs syndrome: Secondary | ICD-10-CM | POA: Diagnosis not present

## 2020-05-09 NOTE — Telephone Encounter (Signed)
I called the pt and we reviewed Dr. Teofilo Pod recommendation. She verbalized understanding and was agreeable to the plan.   Pt had no other questions/concerns at this time.

## 2020-05-09 NOTE — Telephone Encounter (Signed)
Dr. Frances Furbish, are we prescribing med?  Pharmacy has not record of med being sent?

## 2020-05-09 NOTE — Progress Notes (Signed)
Subjective:    Patient ID: Rachel [name] is a 65 y.o. female.  HPI     Huston Foley, MD, PhD [facility] [address] [address] [place], [place] [address]  Dear Dr. Hyacinth Meeker,   I saw your patient, Rachel [name], upon your kind request in my sleep clinic.  For initial consultation of her restless leg syndrome.  The patient is unaccompanied today.  As you know, Ms. Mazzarella is a 65 year old right-handed woman with an underlying medical history of hypothyroidism, asthma, allergies, osteoarthritis, reflux disease, iron deficiency, low back pain, kidney stone, obesity with s/p bariatric surgery, and sleep apnea, who reports a several year history of restless leg symptoms, approximately 7 to 8 years.  She reports a sensation of something crawling through her legs, sometimes it is just an urge to move, she has had leg jerking, symptoms are primarily at night.  Gabapentin helps to some degree.  She was treated with ropinirole in the past but does not recall the dose.  It helped in the beginning but then stopped being effective.  She has not been on Mirapex.  Your referral indicates that she has failed Mirapex as well but she reports that she has never been on it.  She has been on gabapentin, currently it is 1200 mg at bedtime.  Of note, she reports daytime sluggishness in her thinking and mood irritability.  She has recently been started on generic Prozac low-dose 25 mg strength.  She reports that she had frustration intolerance and mood irritability, particularly after her hospitalization for COVID.  She also reports sluggishness in her thinking and losing her train of thought quickly.  She does not sleep well, has difficulty maintaining sleep.  She takes trazodone 50 mg, 1 or 2 pills at bedtime, typically only 1 pill at bedtime.  She has also been on melatonin 10 mg for the past year and a half.  She has not had a recent change in her thyroid medicine.  She had blood work in the  hospital.  I reviewed her hospital records.  She did not had a thyroid function test done at the time.  I reviewed the office note from 01/03/2020.  She had previously seen Dr. Earl Gala for sleep apnea.  She was diagnosed with mild obstructive sleep apnea she had been significantly more obese at the time.  Her home sleep test from 11/27/2015 showed an AHI of 8.2/h, with moderate desaturations, minimum oxygen saturation was 74%.  For her restless leg syndrome, she has been tried on Requip and Mirapex.  She is currently on gabapentin. She had blood work through your office on 09/20/2019 and I reviewed with the results: CBC with differential was unremarkable, CMP showed glucose of 84, BUN 14, creatinine 0.64, normal liver functions, ferritin was 151.9, B12 443, TSH 0.07 which is in low normal, vitamin D51.6, lipid panel showed benign findings, total cholesterol 118, triglycerides 102, HDL 69, LDL 50. She has not been sleeping well.  Especially since her hospitalization in February she has had difficulty maintaining sleep.  She was given trazodone while she was hospitalized.  She was in the hospital from 03/05/2018 to through 03/11/2020 due to COVID-related pneumonia.  She was treated with Rocephin, azithromycin, and 5 treatments of remdesivir.  She was treated with Solu-Medrol which was transitioned to oral prednisone and weaned off.  She was also weaned off oxygen.    She lives with her husband.  She takes care of of her great granddaughter  during the week, 4 days a week.  She is a non-smoker and does not drink caffeine regularly, 1 cup of hot chocolate in the morning, occasional alcohol.  She has had more low back pain, particularly on the left side.  She is status post left total hip replacement and right total knee replacements.  She has fallen, she fell a year ago in April.  She is followed by orthopedics and was told to use a cane as needed.  She also reports that she had a syncopal spell in the context of  getting COVID.  Her Past Medical History Is Significant For: Past Medical History:  Diagnosis Date  . Allergic rhinitis   . GERD (gastroesophageal reflux disease)   . H/O bariatric surgery 10/2016  . History of hiatal hernia   . History of kidney stones   . Hypothyroidism   . Migraines   . Mild asthma   . OA (osteoarthritis)   . Recurrent upper respiratory infection (URI)   . Renal calculus, bilateral   . Wears glasses     Her Past Surgical History Is Significant For: Past Surgical History:  Procedure Laterality Date  . ANKLE ARTHROSCOPY Left 01-27-2002    dr Elita Quick  [facility]  . BREAST SURGERY     lumpectomy x 2 - benign   . CARPAL TUNNEL RELEASE Bilateral right 08-23-2003   dr sypher [facility]/  left 2006 approx.  . CYSTOSCOPY WITH RETROGRADE PYELOGRAM, URETEROSCOPY AND STENT PLACEMENT Bilateral 01/20/2017   Procedure: CYSTOSCOPY WITH RETROGRADE PYELOGRAM, URETEROSCOPY AND STENT PLACEMENT;  Surgeon: Malen Gauze, MD;  Location: [facility];  Service: Urology;  Laterality: Bilateral;  . DILATION AND CURETTAGE OF UTERUS     several   . ESOPHAGOGASTRODUODENOSCOPY  09/2016  . HOLMIUM LASER APPLICATION Bilateral 01/20/2017   Procedure: HOLMIUM LASER APPLICATION;  Surgeon: Malen Gauze, MD;  Location: [facility];  Service: Urology;  Laterality: Bilateral;  . KNEE ARTHROSCOPY WITH MEDIAL MENISECTOMY Right 06/08/2013   Procedure: RIGHT KNEE ARTHROSCOPY WITH LATERAL MENISECTOMY with ABRASION CHRONDROPLASTY and SUPRAPATELLAR SYNOVECTOMY;  Surgeon: Jacki Cones, MD;  Location: [facility];  Service: Orthopedics;  Laterality: Right;  . LAPAROSCOPIC CHOLECYSTECTOMY  1996  . LAPAROSCOPIC GASTRIC RESTRICTIVE DUODENAL PROCEDURE (DUODENAL SWITCH)  11-11-2016     [facility] in [place] , [place]   AND HIATAL HERNIA REPAIR  . TONSILLECTOMY  child  . TOTAL HIP ARTHROPLASTY Left 04-02-2006   dr Darrelyn Hillock   [facility]  . TOTAL KNEE ARTHROPLASTY Right 01/25/2015   Procedure: TOTAL KNEE  ARTHROPLASTY;  Surgeon: Ranee Gosselin, MD;  Location: [facility];  Service: Orthopedics;  Laterality: Right;    Her Family History Is Significant For: Family History  Problem Relation Age of Onset  . Asthma Mother   . Asthma Sister   . Urticaria Neg Hx   . Eczema Neg Hx   . Allergic rhinitis Neg Hx     Her Social History Is Significant For: Social History   Socioeconomic History  . Marital status: Married    Spouse name: Not on file  . Number of children: Not on file  . Years of education: Not on file  . Highest education level: Not on file  Occupational History  . Not on file  Tobacco Use  . Smoking status: Never Smoker  . Smokeless tobacco: Never Used  Vaping Use  . Vaping Use: Never used  Substance and Sexual Activity  . Alcohol use: No  . Drug use: No  .  Sexual activity: Not on file  Other Topics Concern  . Not on file  Social History Narrative  . Not on file   Social Determinants of Health   Financial Resource Strain: Not on file  Food Insecurity: Not on file  Transportation Needs: Not on file  Physical Activity: Not on file  Stress: Not on file  Social Connections: Not on file    Her Allergies Are:  Allergies  Allergen Reactions  . Bee Venom Anaphylaxis  . Codeine Itching  . Guaifenesin & Derivatives Anaphylaxis  . Molds & Smuts Shortness Of Breath    Nose will burn  . Pollen Extract     Other reaction(s): Eye Irritation Itchy Eyes, Burning Eyes, Sneezing  :   Her Current Medications Are:  Outpatient Encounter Medications as of 05/09/2020  Medication Sig  . acetaminophen (TYLENOL) 500 MG tablet Take 1,000 mg by mouth every 6 (six) hours as needed for mild pain.  Marland Kitchen albuterol (PROVENTIL) (2.5 MG/3ML) 0.083% nebulizer solution Inhale 3 mLs into the lungs every 6 (six) hours as needed for shortness of breath.  . Ascorbic Acid (VITA-C PO) Take by mouth.  Marland Kitchen aspirin EC 81 MG tablet Take 81 mg by mouth daily. Swallow whole.  Marland Kitchen atorvastatin (LIPITOR) 10 MG  tablet Take 10 mg by mouth daily.  . calcium citrate-vitamin D 500-500 MG-UNIT chewable tablet Chew 2 tablets by mouth daily.  Marland Kitchen docusate sodium (COLACE) 100 MG capsule Take 100 mg by mouth 2 (two) times daily.  Marland Kitchen EPINEPHrine 0.3 mg/0.3 mL IJ SOAJ injection Inject 0.3 mg into the muscle as needed for anaphylaxis.   . fexofenadine (ALLEGRA) 180 MG tablet Take 180 mg by mouth daily.  Marland Kitchen FLOVENT HFA 110 MCG/ACT inhaler Inhale 1 puff into the lungs 2 (two) times daily.  Marland Kitchen FLUoxetine (PROZAC) 20 MG capsule 1 capsule  . fluticasone (FLONASE) 50 MCG/ACT nasal spray Place 2 sprays into both nostrils at bedtime.   . gabapentin (NEURONTIN) 300 MG capsule Take 1,200 mg by mouth at bedtime.  Marland Kitchen levothyroxine (SYNTHROID, LEVOTHROID) 137 MCG tablet Take 137 mcg by mouth daily before breakfast.   . Melatonin 10 MG TABS Take 10 mg by mouth at bedtime.   . Multiple Vitamin (MULTIVITAMIN) tablet Take 1 tablet by mouth daily. Celebrity nuitriional supplement multiple vitaman complete chews  . pantoprazole (PROTONIX) 40 MG tablet Take 40 mg by mouth daily.  . traZODone (DESYREL) 50 MG tablet Take 50 mg by mouth at bedtime. 1-2 tabs at bedtime  . Zinc Sulfate (ZINC-220 PO)   . [DISCONTINUED] albuterol (PROVENTIL HFA;VENTOLIN HFA) 108 (90 Base) MCG/ACT inhaler Inhale 1 puff into the lungs every 6 (six) hours as needed for wheezing or shortness of breath.  . [DISCONTINUED] ibuprofen (ADVIL) 200 MG tablet Take 200 mg by mouth every 6 (six) hours as needed for moderate pain or headache.   No facility-administered encounter medications on file as of 05/09/2020.  :   Review of Systems:  Out of a complete 14 point review of systems, all are reviewed and negative with the exception of these symptoms as listed below:  Review of Systems  Neurological:       Here for consult on worsening RLS sx. Pt has tried Requip, gabapentin ( taking 4 at night), pt repots she has not tried mirapex as of yet. Pt reports sx have not  improved with these meds.   Pt also reports memory fog post covid. She was dx back in feb of 2022 and stayed in  the [facility] for 6 days.Pt also reports she does not sleep well.     Objective:  Neurological Exam  Physical Exam Physical Examination:   Vitals:   05/09/20 0751  BP: 121/83  Pulse: 66    General Examination: The patient is a very pleasant 65 y.o. female in no acute distress. She appears well-developed and well-nourished and well groomed.   HEENT: Normocephalic, atraumatic, pupils are equal, round and reactive to light, extraocular tracking is good without limitation to gaze excursion or nystagmus noted. Hearing is grossly intact. Face is symmetric with normal facial animation. Speech is clear with no dysarthria noted. There is no hypophonia. There is no lip, neck/head, jaw or voice tremor. Neck is supple with full range of passive and active motion. There are no carotid bruits on auscultation. Oropharynx exam reveals: mild mouth dryness, adequate dental hygiene and mild airway crowding, due to small airway entry.  Tonsils absent.  Mallampati class I.  Neck circumference of 14 inches.  Tongue protrudes centrally and palate elevates symmetrically.  Chest: Clear to auscultation without wheezing, rhonchi or crackles noted.  Heart: S1+S2+0, regular and normal without murmurs, rubs or gallops noted.   Abdomen: Soft, non-tender and non-distended with normal bowel sounds appreciated on auscultation.  Extremities: There is trace pitting edema in the distal lower extremities bilaterally.   Skin: Warm and dry without trophic changes noted.   Musculoskeletal: exam reveals decreased range of motion in the left hip and right knee.  She reports low back pain, left side.    Neurologically:  Mental status: The patient is awake, alert and oriented in all 4 spheres. Her immediate and remote memory, attention, language skills and fund of knowledge are appropriate. There is no evidence of aphasia,  agnosia, apraxia or anomia. Speech is clear with normal prosody and enunciation. Thought process is linear. Mood is normal and affect is normal.  Cranial nerves II - XII are as described above under HEENT exam.  Motor exam: Normal bulk, strength and tone is noted. There is no postural or action or resting tremor. Romberg is negative. Reflexes are 1-2+ throughout.  Toes are downgoing bilaterally.  Fine motor skills and coordination: grossly intact.  Cerebellar testing: No dysmetria or intention tremor.  Finger-to-nose is unremarkable, heel-to-shin is difficult secondary to restriction in range of motion in the left hip and right knee.  There is no truncal or gait ataxia.  Sensory exam: intact to light touch in the upper and lower extremities.  Gait, station and balance: She stands with mild difficulty, stand slightly wide-based.  She walks with a limp.   Assessment and Plan:  In summary, Leverne Humblesngela G Marling is a very pleasant 65 y.o.-year old female with an underlying medical history of hypothyroidism, asthma, allergies, osteoarthritis, reflux disease, iron deficiency, low back pain, kidney stone, obesity with s/p bariatric surgery, and sleep apnea, who presents for evaluation of her restless leg syndrome of several years duration.  She has been treated in the past with ropinirole, she is unsure about the dose, it was helpful in the beginning.  She is currently on a higher dose of gabapentin at bedtime.  In addition, she takes trazodone at night for sleep.  She was recently started on generic Prozac for mood irritability.  She is advised that SSRI type medications can exacerbate restless leg symptoms and leg twitching at night.  I would not recommend increasing the gabapentin at this time as she is on the higher dose and also takes additional medications for  sleep at night.  She is advised to talk to her about her thyroid function.  She may not have had blood work for thyroid function evaluation since August and  her TSH was low at the time.  She reports difficulty sleeping and some snoring.  We will proceed with a sleep study at this time, a laboratory attended sleep study would allow for monitoring her leg movements.  She is furthermore advised that if she has residual obstructive sleep apnea, treating her sleep apnea with a CPAP or AutoPap machine may help with her restless leg symptoms as well as leg twitching at night.  A home sleep test would unfortunately not allow for monitoring her leg movements.  It would screen her for sleep apnea only.  She is advised that we can consider Mirapex next since she has not tried it.  We will await test results from a sleep study and plan to follow-up after testing in this clinic.  I answered all her questions today and she was in agreement with the plan.   Thank you very much for allowing me to participate in the care of this nice patient. If I can be of any further assistance to you please do not hesitate to call me at [phone].  Sincerely,   Huston Foley, MD, PhD

## 2020-05-09 NOTE — Telephone Encounter (Signed)
Pt called, was told by physician if Mirapex was not at the pharmacy to call back. Mirapex is not at the pharmacy. Would like a call from the nurse.

## 2020-05-09 NOTE — Telephone Encounter (Signed)
We had discussed this during her clinic appointment, please clarify again with the patient.  We can consider Mirapex after the sleep study.  If she has sleep apnea, we will pursue treatment of her sleep apnea first.  I have not prescribed Mirapex.

## 2020-05-09 NOTE — Patient Instructions (Signed)
Based on your symptoms and your exam I believe we should look for leg movements during sleep, which we call periodic limb movements.  We will also reevaluate your sleep apnea diagnosis.    For your restless leg symptoms we can try Mirapex next. It's generic name is pramipexole, and since you have not tried this medication, it may be worthwhile.  You are already on a high dose of gabapentin.  In addition, you take trazodone at night for sleep as well as melatonin.  I would not recommend increasing the gabapentin at this time.   Please do try to make enough time for sleep, 7 to 8 hours of sleep are recommended.    If your Sleep study shows obstructive sleep apnea, we will consider treatment with a CPAP machine or AutoPap machine. Studies have shown that patients with sleep apnea can have an increased frequency of restless leg symptoms and leg twitching at night.  Treating sleep apnea can improve these.    The risks and ramifications of moderate to severe obstructive sleep apnea or OSA are: Cardiovascular disease, including congestive heart failure, stroke, difficult to control hypertension, arrhythmias, and even type 2 diabetes has been linked to untreated OSA. Sleep apnea causes disruption of sleep and sleep deprivation in most cases, which, in turn, can cause recurrent headaches, problems with memory, mood, concentration, focus, and vigilance. Most people with untreated sleep apnea report excessive daytime sleepiness, which can affect their ability to drive. Please do not drive if you feel sleepy.   We will call you after your sleep study to advise about the results (most likely, you will hear from [facility], my nurse).    Our sleep lab administrative assistant, will call you to schedule your sleep study. If you don't hear back from her by about 2 weeks from now, please feel free to call her at [phone]. This is her direct line and please leave a message with your phone number to call back if you get the  voicemail box. She will call back as soon as possible.   Please also talk to your primary care physician about your thyroid function.  When she checked her labs in August, your TSH was low.

## 2020-05-16 DIAGNOSIS — K5730 Diverticulosis of large intestine without perforation or abscess without bleeding: Secondary | ICD-10-CM | POA: Diagnosis not present

## 2020-05-16 DIAGNOSIS — Z8371 Family history of colonic polyps: Secondary | ICD-10-CM | POA: Diagnosis not present

## 2020-05-25 DIAGNOSIS — Z01419 Encounter for gynecological examination (general) (routine) without abnormal findings: Secondary | ICD-10-CM | POA: Diagnosis not present

## 2020-05-25 DIAGNOSIS — Z1231 Encounter for screening mammogram for malignant neoplasm of breast: Secondary | ICD-10-CM | POA: Diagnosis not present

## 2020-05-25 DIAGNOSIS — Z6839 Body mass index (BMI) 39.0-39.9, adult: Secondary | ICD-10-CM | POA: Diagnosis not present

## 2020-07-07 DIAGNOSIS — J4530 Mild persistent asthma, uncomplicated: Secondary | ICD-10-CM | POA: Diagnosis not present

## 2020-07-07 DIAGNOSIS — R059 Cough, unspecified: Secondary | ICD-10-CM | POA: Diagnosis not present

## 2020-10-03 DIAGNOSIS — M25561 Pain in right knee: Secondary | ICD-10-CM | POA: Insufficient documentation

## 2020-10-03 DIAGNOSIS — M545 Low back pain, unspecified: Secondary | ICD-10-CM | POA: Diagnosis not present

## 2020-10-03 DIAGNOSIS — M79642 Pain in left hand: Secondary | ICD-10-CM | POA: Diagnosis not present

## 2020-10-03 DIAGNOSIS — M79641 Pain in right hand: Secondary | ICD-10-CM | POA: Insufficient documentation

## 2020-10-24 DIAGNOSIS — Z23 Encounter for immunization: Secondary | ICD-10-CM | POA: Diagnosis not present

## 2020-10-24 DIAGNOSIS — I700 Atherosclerosis of aorta: Secondary | ICD-10-CM | POA: Diagnosis not present

## 2020-10-24 DIAGNOSIS — E781 Pure hyperglyceridemia: Secondary | ICD-10-CM | POA: Diagnosis not present

## 2020-10-24 DIAGNOSIS — M4726 Other spondylosis with radiculopathy, lumbar region: Secondary | ICD-10-CM | POA: Diagnosis not present

## 2020-10-24 DIAGNOSIS — F5101 Primary insomnia: Secondary | ICD-10-CM | POA: Diagnosis not present

## 2020-10-24 DIAGNOSIS — G2581 Restless legs syndrome: Secondary | ICD-10-CM | POA: Diagnosis not present

## 2020-10-24 DIAGNOSIS — Z79899 Other long term (current) drug therapy: Secondary | ICD-10-CM | POA: Diagnosis not present

## 2020-10-24 DIAGNOSIS — E039 Hypothyroidism, unspecified: Secondary | ICD-10-CM | POA: Diagnosis not present

## 2020-12-04 DIAGNOSIS — Z0000 Encounter for general adult medical examination without abnormal findings: Secondary | ICD-10-CM | POA: Diagnosis not present

## 2020-12-04 DIAGNOSIS — Z1389 Encounter for screening for other disorder: Secondary | ICD-10-CM | POA: Diagnosis not present

## 2020-12-20 DIAGNOSIS — G2581 Restless legs syndrome: Secondary | ICD-10-CM | POA: Diagnosis not present

## 2020-12-20 DIAGNOSIS — E039 Hypothyroidism, unspecified: Secondary | ICD-10-CM | POA: Diagnosis not present

## 2020-12-22 DIAGNOSIS — J209 Acute bronchitis, unspecified: Secondary | ICD-10-CM | POA: Diagnosis not present

## 2021-01-16 DIAGNOSIS — E039 Hypothyroidism, unspecified: Secondary | ICD-10-CM | POA: Diagnosis not present

## 2021-02-15 DIAGNOSIS — M545 Low back pain, unspecified: Secondary | ICD-10-CM | POA: Diagnosis not present

## 2021-02-15 DIAGNOSIS — M1711 Unilateral primary osteoarthritis, right knee: Secondary | ICD-10-CM | POA: Diagnosis not present

## 2021-04-17 DIAGNOSIS — M150 Primary generalized (osteo)arthritis: Secondary | ICD-10-CM | POA: Diagnosis not present

## 2021-04-17 DIAGNOSIS — E781 Pure hyperglyceridemia: Secondary | ICD-10-CM | POA: Diagnosis not present

## 2021-04-17 DIAGNOSIS — G8929 Other chronic pain: Secondary | ICD-10-CM | POA: Diagnosis not present

## 2021-04-17 DIAGNOSIS — J4530 Mild persistent asthma, uncomplicated: Secondary | ICD-10-CM | POA: Diagnosis not present

## 2021-04-24 DIAGNOSIS — Z9884 Bariatric surgery status: Secondary | ICD-10-CM | POA: Diagnosis not present

## 2021-04-24 DIAGNOSIS — M25562 Pain in left knee: Secondary | ICD-10-CM | POA: Diagnosis not present

## 2021-04-24 DIAGNOSIS — J301 Allergic rhinitis due to pollen: Secondary | ICD-10-CM | POA: Diagnosis not present

## 2021-04-24 DIAGNOSIS — E039 Hypothyroidism, unspecified: Secondary | ICD-10-CM | POA: Diagnosis not present

## 2021-04-24 DIAGNOSIS — I700 Atherosclerosis of aorta: Secondary | ICD-10-CM | POA: Diagnosis not present

## 2021-05-03 DIAGNOSIS — M545 Low back pain, unspecified: Secondary | ICD-10-CM | POA: Diagnosis not present

## 2021-05-28 DIAGNOSIS — E785 Hyperlipidemia, unspecified: Secondary | ICD-10-CM | POA: Insufficient documentation

## 2021-05-28 DIAGNOSIS — Z6834 Body mass index (BMI) 34.0-34.9, adult: Secondary | ICD-10-CM | POA: Diagnosis not present

## 2021-05-28 DIAGNOSIS — Z1231 Encounter for screening mammogram for malignant neoplasm of breast: Secondary | ICD-10-CM | POA: Diagnosis not present

## 2021-05-28 DIAGNOSIS — Z124 Encounter for screening for malignant neoplasm of cervix: Secondary | ICD-10-CM | POA: Diagnosis not present

## 2021-05-28 DIAGNOSIS — M5430 Sciatica, unspecified side: Secondary | ICD-10-CM | POA: Insufficient documentation

## 2021-06-25 DIAGNOSIS — H524 Presbyopia: Secondary | ICD-10-CM | POA: Diagnosis not present

## 2021-06-25 DIAGNOSIS — H5213 Myopia, bilateral: Secondary | ICD-10-CM | POA: Diagnosis not present

## 2021-06-29 DIAGNOSIS — Z9884 Bariatric surgery status: Secondary | ICD-10-CM | POA: Diagnosis not present

## 2021-07-12 DIAGNOSIS — Z9884 Bariatric surgery status: Secondary | ICD-10-CM | POA: Diagnosis not present

## 2021-07-19 DIAGNOSIS — R233 Spontaneous ecchymoses: Secondary | ICD-10-CM | POA: Diagnosis not present

## 2021-07-19 DIAGNOSIS — T148XXA Other injury of unspecified body region, initial encounter: Secondary | ICD-10-CM | POA: Diagnosis not present

## 2021-07-19 DIAGNOSIS — D692 Other nonthrombocytopenic purpura: Secondary | ICD-10-CM | POA: Diagnosis not present

## 2021-07-19 DIAGNOSIS — E162 Hypoglycemia, unspecified: Secondary | ICD-10-CM | POA: Diagnosis not present

## 2021-08-24 DIAGNOSIS — M545 Low back pain, unspecified: Secondary | ICD-10-CM | POA: Diagnosis not present

## 2021-09-11 IMAGING — DX DG CHEST 1V PORT
1 series · 1 of 1 positions shown · non-contrast
Comparison: January 12, 2015

CLINICAL DATA: 4L5C3-PT positive with cough and shortness of breath

EXAM:
PORTABLE CHEST 1 VIEW

[chest ap]
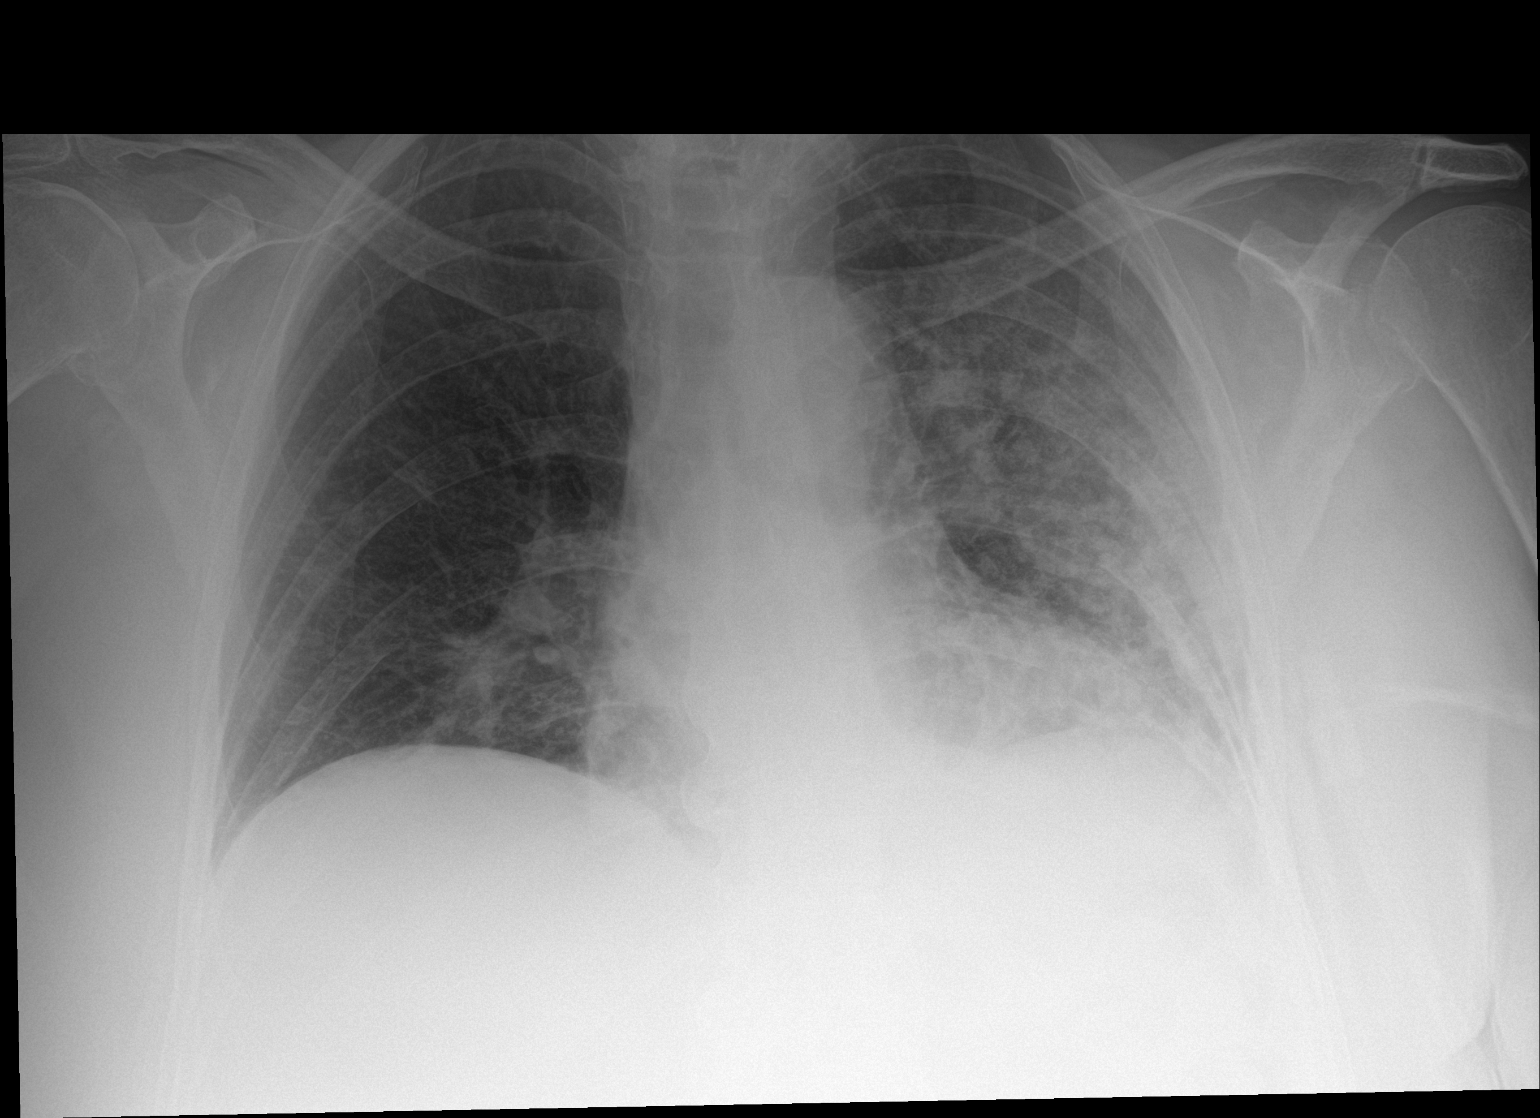

[1 of 1 positions shown; findings below may reference images not displayed]

FINDINGS: There is airspace opacity throughout much of the left lung. Right
lung is clear. Heart size and pulmonary vascularity are normal. No
adenopathy. No bone lesions.
IMPRESSION: Airspace opacity consistent with pneumonia throughout much of the
left lung. Suspect multifocal pneumonia, likely of atypical organism
etiology. Right lung clear. Cardiac silhouette within normal limits.

## 2021-10-24 DIAGNOSIS — E039 Hypothyroidism, unspecified: Secondary | ICD-10-CM | POA: Diagnosis not present

## 2021-10-24 DIAGNOSIS — M545 Low back pain, unspecified: Secondary | ICD-10-CM | POA: Diagnosis not present

## 2021-10-24 DIAGNOSIS — Z9884 Bariatric surgery status: Secondary | ICD-10-CM | POA: Diagnosis not present

## 2021-11-19 DIAGNOSIS — F5101 Primary insomnia: Secondary | ICD-10-CM | POA: Diagnosis not present

## 2021-11-19 DIAGNOSIS — Z6835 Body mass index (BMI) 35.0-35.9, adult: Secondary | ICD-10-CM | POA: Diagnosis not present

## 2021-11-19 DIAGNOSIS — E039 Hypothyroidism, unspecified: Secondary | ICD-10-CM | POA: Diagnosis not present

## 2021-11-19 DIAGNOSIS — F419 Anxiety disorder, unspecified: Secondary | ICD-10-CM | POA: Diagnosis not present

## 2021-11-19 DIAGNOSIS — Z79899 Other long term (current) drug therapy: Secondary | ICD-10-CM | POA: Diagnosis not present

## 2021-11-19 DIAGNOSIS — I700 Atherosclerosis of aorta: Secondary | ICD-10-CM | POA: Diagnosis not present

## 2021-12-06 DIAGNOSIS — Z1389 Encounter for screening for other disorder: Secondary | ICD-10-CM | POA: Diagnosis not present

## 2021-12-06 DIAGNOSIS — Z0000 Encounter for general adult medical examination without abnormal findings: Secondary | ICD-10-CM | POA: Diagnosis not present

## 2022-01-08 DIAGNOSIS — M8588 Other specified disorders of bone density and structure, other site: Secondary | ICD-10-CM | POA: Diagnosis not present

## 2022-01-08 DIAGNOSIS — M5136 Other intervertebral disc degeneration, lumbar region: Secondary | ICD-10-CM | POA: Diagnosis not present

## 2022-01-08 DIAGNOSIS — K219 Gastro-esophageal reflux disease without esophagitis: Secondary | ICD-10-CM | POA: Diagnosis not present

## 2022-01-08 DIAGNOSIS — R29890 Loss of height: Secondary | ICD-10-CM | POA: Diagnosis not present

## 2022-01-08 DIAGNOSIS — N958 Other specified menopausal and perimenopausal disorders: Secondary | ICD-10-CM | POA: Diagnosis not present

## 2022-01-08 DIAGNOSIS — Z8262 Family history of osteoporosis: Secondary | ICD-10-CM | POA: Diagnosis not present

## 2022-01-23 DIAGNOSIS — Z9884 Bariatric surgery status: Secondary | ICD-10-CM | POA: Diagnosis not present

## 2022-01-23 DIAGNOSIS — M5442 Lumbago with sciatica, left side: Secondary | ICD-10-CM | POA: Diagnosis not present

## 2022-01-23 DIAGNOSIS — E039 Hypothyroidism, unspecified: Secondary | ICD-10-CM | POA: Diagnosis not present

## 2022-01-28 DIAGNOSIS — M5432 Sciatica, left side: Secondary | ICD-10-CM | POA: Diagnosis not present

## 2022-01-28 DIAGNOSIS — M545 Low back pain, unspecified: Secondary | ICD-10-CM | POA: Diagnosis not present

## 2022-01-28 DIAGNOSIS — E039 Hypothyroidism, unspecified: Secondary | ICD-10-CM | POA: Diagnosis not present

## 2022-01-28 DIAGNOSIS — Z9884 Bariatric surgery status: Secondary | ICD-10-CM | POA: Diagnosis not present

## 2022-02-11 DIAGNOSIS — M5442 Lumbago with sciatica, left side: Secondary | ICD-10-CM | POA: Diagnosis not present

## 2022-02-25 DIAGNOSIS — E669 Obesity, unspecified: Secondary | ICD-10-CM | POA: Diagnosis not present

## 2022-02-25 DIAGNOSIS — M5442 Lumbago with sciatica, left side: Secondary | ICD-10-CM | POA: Diagnosis not present

## 2022-02-25 DIAGNOSIS — E039 Hypothyroidism, unspecified: Secondary | ICD-10-CM | POA: Diagnosis not present

## 2022-03-13 DIAGNOSIS — E039 Hypothyroidism, unspecified: Secondary | ICD-10-CM | POA: Diagnosis not present

## 2022-03-13 DIAGNOSIS — M5442 Lumbago with sciatica, left side: Secondary | ICD-10-CM | POA: Diagnosis not present

## 2022-03-13 DIAGNOSIS — Z9884 Bariatric surgery status: Secondary | ICD-10-CM | POA: Diagnosis not present

## 2022-05-29 DIAGNOSIS — D692 Other nonthrombocytopenic purpura: Secondary | ICD-10-CM | POA: Diagnosis not present

## 2022-05-29 DIAGNOSIS — F5101 Primary insomnia: Secondary | ICD-10-CM | POA: Diagnosis not present

## 2022-05-29 DIAGNOSIS — Z6835 Body mass index (BMI) 35.0-35.9, adult: Secondary | ICD-10-CM | POA: Diagnosis not present

## 2022-05-29 DIAGNOSIS — E039 Hypothyroidism, unspecified: Secondary | ICD-10-CM | POA: Diagnosis not present

## 2022-05-29 DIAGNOSIS — M4726 Other spondylosis with radiculopathy, lumbar region: Secondary | ICD-10-CM | POA: Diagnosis not present

## 2022-05-29 DIAGNOSIS — G4733 Obstructive sleep apnea (adult) (pediatric): Secondary | ICD-10-CM | POA: Diagnosis not present

## 2022-05-29 DIAGNOSIS — I700 Atherosclerosis of aorta: Secondary | ICD-10-CM | POA: Diagnosis not present

## 2022-07-15 DIAGNOSIS — S63502A Unspecified sprain of left wrist, initial encounter: Secondary | ICD-10-CM | POA: Diagnosis not present

## 2022-07-15 DIAGNOSIS — S43402A Unspecified sprain of left shoulder joint, initial encounter: Secondary | ICD-10-CM | POA: Diagnosis not present

## 2022-10-10 DIAGNOSIS — M25552 Pain in left hip: Secondary | ICD-10-CM | POA: Diagnosis not present

## 2022-10-10 DIAGNOSIS — M25562 Pain in left knee: Secondary | ICD-10-CM | POA: Diagnosis not present

## 2022-10-10 DIAGNOSIS — Z96642 Presence of left artificial hip joint: Secondary | ICD-10-CM | POA: Diagnosis not present

## 2022-10-23 ENCOUNTER — Emergency Department (HOSPITAL_BASED_OUTPATIENT_CLINIC_OR_DEPARTMENT_OTHER)
Admission: EM | Admit: 2022-10-23 | Discharge: 2022-10-23 | Disposition: A | Discharge status: Home / Self Care | Payer: Medicare PPO | Attending: Emergency Medicine | Admitting: Emergency Medicine

## 2022-10-23 ENCOUNTER — Emergency Department (HOSPITAL_BASED_OUTPATIENT_CLINIC_OR_DEPARTMENT_OTHER): Payer: Medicare PPO

## 2022-10-23 ENCOUNTER — Encounter (HOSPITAL_BASED_OUTPATIENT_CLINIC_OR_DEPARTMENT_OTHER): Payer: Self-pay | Admitting: Emergency Medicine

## 2022-10-23 ENCOUNTER — Other Ambulatory Visit: Payer: Self-pay

## 2022-10-23 DIAGNOSIS — M25552 Pain in left hip: Secondary | ICD-10-CM | POA: Insufficient documentation

## 2022-10-23 DIAGNOSIS — Z79899 Other long term (current) drug therapy: Secondary | ICD-10-CM | POA: Diagnosis not present

## 2022-10-23 DIAGNOSIS — E039 Hypothyroidism, unspecified: Secondary | ICD-10-CM | POA: Insufficient documentation

## 2022-10-23 DIAGNOSIS — N200 Calculus of kidney: Secondary | ICD-10-CM | POA: Insufficient documentation

## 2022-10-23 DIAGNOSIS — J45909 Unspecified asthma, uncomplicated: Secondary | ICD-10-CM | POA: Insufficient documentation

## 2022-10-23 DIAGNOSIS — W19XXXA Unspecified fall, initial encounter: Secondary | ICD-10-CM | POA: Diagnosis not present

## 2022-10-23 DIAGNOSIS — M47816 Spondylosis without myelopathy or radiculopathy, lumbar region: Secondary | ICD-10-CM | POA: Diagnosis not present

## 2022-10-23 DIAGNOSIS — R519 Headache, unspecified: Secondary | ICD-10-CM | POA: Diagnosis not present

## 2022-10-23 DIAGNOSIS — Z7982 Long term (current) use of aspirin: Secondary | ICD-10-CM | POA: Diagnosis not present

## 2022-10-23 DIAGNOSIS — M47811 Spondylosis without myelopathy or radiculopathy, occipito-atlanto-axial region: Secondary | ICD-10-CM | POA: Diagnosis not present

## 2022-10-23 DIAGNOSIS — M4802 Spinal stenosis, cervical region: Secondary | ICD-10-CM | POA: Diagnosis not present

## 2022-10-23 DIAGNOSIS — M1712 Unilateral primary osteoarthritis, left knee: Secondary | ICD-10-CM | POA: Diagnosis not present

## 2022-10-23 DIAGNOSIS — Z7951 Long term (current) use of inhaled steroids: Secondary | ICD-10-CM | POA: Diagnosis not present

## 2022-10-23 DIAGNOSIS — S199XXA Unspecified injury of neck, initial encounter: Secondary | ICD-10-CM | POA: Diagnosis not present

## 2022-10-23 DIAGNOSIS — M47812 Spondylosis without myelopathy or radiculopathy, cervical region: Secondary | ICD-10-CM | POA: Diagnosis not present

## 2022-10-23 DIAGNOSIS — M79605 Pain in left leg: Secondary | ICD-10-CM | POA: Diagnosis not present

## 2022-10-23 DIAGNOSIS — K5730 Diverticulosis of large intestine without perforation or abscess without bleeding: Secondary | ICD-10-CM | POA: Diagnosis not present

## 2022-10-23 DIAGNOSIS — S0990XA Unspecified injury of head, initial encounter: Secondary | ICD-10-CM | POA: Diagnosis not present

## 2022-10-23 DIAGNOSIS — Z96642 Presence of left artificial hip joint: Secondary | ICD-10-CM | POA: Diagnosis not present

## 2022-10-23 DIAGNOSIS — S3992XA Unspecified injury of lower back, initial encounter: Secondary | ICD-10-CM | POA: Diagnosis not present

## 2022-10-23 MED ORDER — OXYCODONE-ACETAMINOPHEN 5-325 MG PO TABS
1.0000 | ORAL_TABLET | Freq: Once | ORAL | Status: AC
  Administered 2022-10-23: 1 via ORAL
  Filled 2022-10-23: qty 1

## 2022-10-23 MED ORDER — HYDROCODONE-ACETAMINOPHEN 5-325 MG PO TABS: 1.0000 | ORAL_TABLET | ORAL | 0 refills | Status: DC | PRN

## 2022-10-23 NOTE — ED Triage Notes (Signed)
C/O coming stepping off the curb a little while ago. Stated she hit the back of her head and left hip with hx of replacement.

## 2022-10-23 NOTE — ED Notes (Signed)
Return to room

## 2022-10-23 NOTE — ED Provider Notes (Signed)
[facility] Provider Note   CSN: [id number] Arrival date & time: 10/23/22  1115     History  Chief Complaint  Patient presents with   Marletta Lor    Rachel [name] is a 67 y.o. female.  67 year old female brought in by daughter with concern for injuries from a fall.  Patient fell 2 weeks ago and injured her left hip, went to the urgent care associated with her doctor's office and was told was not broken.  Continues to be sore and limping.  Patient was with her daughter today, stepped down off of a curb and fell to the ground.  Reports hitting the back of her head, has worsening pain in her left hip.  No loss of consciousness, not on thinners.  Denies feeling weak, dizzy, lightheaded prior to the fall.  No other injuries, complaints, concerns.       Home Medications Prior to Admission medications   Medication Sig Start Date End Date Taking? Authorizing Provider  HYDROcodone-acetaminophen (NORCO/VICODIN) 5-325 MG tablet Take 1 tablet by mouth every 4 (four) hours as needed. 10/23/22  Yes Jeannie Fend, PA-C  acetaminophen (TYLENOL) 500 MG tablet Take 1,000 mg by mouth every 6 (six) hours as needed for mild pain.    [provider]  albuterol (PROVENTIL) (2.5 MG/3ML) 0.083% nebulizer solution Inhale 3 mLs into the lungs every 6 (six) hours as needed for shortness of breath.    [provider]  Ascorbic Acid (VITA-C PO) Take by mouth.    [provider]  aspirin EC 81 MG tablet Take 81 mg by mouth daily. Swallow whole.    [provider]  atorvastatin (LIPITOR) 10 MG tablet Take 10 mg by mouth daily. 02/16/20   [provider]  calcium citrate-vitamin D 500-500 MG-UNIT chewable tablet Chew 2 tablets by mouth daily.    [provider]  docusate sodium (COLACE) 100 MG capsule Take 100 mg by mouth 2 (two) times daily.    [provider]  EPINEPHrine 0.3 mg/0.3 mL IJ SOAJ injection Inject  0.3 mg into the muscle as needed for anaphylaxis.     [provider]  fexofenadine (ALLEGRA) 180 MG tablet Take 180 mg by mouth daily.    [provider]  FLOVENT HFA 110 MCG/ACT inhaler Inhale 1 puff into the lungs 2 (two) times daily. 03/01/20   [provider]  FLUoxetine (PROZAC) 20 MG capsule 1 capsule 05/01/20   [provider]  fluticasone (FLONASE) 50 MCG/ACT nasal spray Place 2 sprays into both nostrils at bedtime.     [provider]  gabapentin (NEURONTIN) 300 MG capsule Take 1,200 mg by mouth at bedtime. 12/24/18   [provider]  levothyroxine (SYNTHROID, LEVOTHROID) 137 MCG tablet Take 137 mcg by mouth daily before breakfast.     [provider]  Melatonin 10 MG TABS Take 10 mg by mouth at bedtime.     [provider]  Multiple Vitamin (MULTIVITAMIN) tablet Take 1 tablet by mouth daily. Celebrity nuitriional supplement multiple vitaman complete chews    [provider]  pantoprazole (PROTONIX) 40 MG tablet Take 40 mg by mouth daily. 01/12/20   [provider]  traZODone (DESYREL) 50 MG tablet Take 50 mg by mouth at bedtime. 1-2 tabs at bedtime    [provider]  Zinc Sulfate (ZINC-220 PO)     [provider]      Allergies    Bee venom, Codeine,  Guaifenesin & derivatives, Molds & smuts, and Pollen extract    Review of Systems   Review of Systems Negative except as per HPI Physical Exam Updated Vital Signs BP 119/62   Pulse 61   Temp 97.8 F (36.6 C) (Oral)   Resp 17   Ht 5\' 6"  (1.676 m)   Wt 95.3 kg   LMP 06/08/2009   SpO2 99%   BMI 33.89 kg/m  Physical Exam Vitals and nursing note reviewed.  Constitutional:      General: She is not in acute distress.    Appearance: She is well-developed. She is not diaphoretic.  HENT:     Head: Normocephalic and atraumatic.  Cardiovascular:     Pulses: Normal pulses.  Pulmonary:     Effort: Pulmonary effort is normal.   Abdominal:     Palpations: Abdomen is soft.     Tenderness: There is no abdominal tenderness.  Musculoskeletal:        General: Tenderness present. No swelling or deformity.     Cervical back: No tenderness or bony tenderness.     Thoracic back: No tenderness or bony tenderness.     Lumbar back: No tenderness or bony tenderness.     Right hip: Normal.     Left hip: Tenderness present. Decreased range of motion.     Right knee: Normal.     Comments: Pain in left hip with log roll left leg. Pain with palpation over left hip  Skin:    General: Skin is warm and dry.     Findings: No bruising, erythema or rash.  Neurological:     Mental Status: She is alert and oriented to person, place, and time.     Sensory: No sensory deficit.  Psychiatric:        Behavior: Behavior normal.     ED Results / Procedures / Treatments   Labs (all labs ordered are listed, but only abnormal results are displayed) Labs Reviewed - No data to display  EKG None  Radiology CT Lumbar Spine Wo Contrast  Result Date: 10/23/2022 CLINICAL DATA:  Larey Seat today with trauma to the back. Low back and left hip pain. EXAM: CT LUMBAR SPINE WITHOUT CONTRAST TECHNIQUE: Multidetector CT imaging of the lumbar spine was performed without intravenous contrast administration. Multiplanar CT image reconstructions were also generated. RADIATION DOSE REDUCTION: This exam was performed according to the departmental dose-optimization program which includes automated exposure control, adjustment of the mA and/or kV according to patient size and/or use of iterative reconstruction technique. COMPARISON:  04/24/2019 FINDINGS: Segmentation: 5 lumbar type vertebral bodies. Alignment: 2 mm degenerative anterolisthesis L4-5. Vertebrae: No fracture or focal bone lesion. Paraspinal and other soft tissues: Bilateral renal calculi. Stone in the left extra renal pelvis measuring up to 1.3 cm in size, not obstructive at this time, but which could  cause ball valve obstruction. Disc levels: T11-12 and T12-L1: Solid bridging anterior osteophytes. No stenosis. L1-2: Normal interspace. L2-3: Minimal disc bulge.  No stenosis. L3-4: Disc degeneration with moderate bulging. Mild facet and ligamentous hypertrophy. Stenosis of both lateral recesses but without definite neural compression. L4-5: Chronic disc protrusion with some midline calcification. Facet arthropathy with facet and ligamentous hypertrophy. Severe multifactorial stenosis that could cause neural compression on either or both sides. L5-S1: Minimal disc bulge. Bilateral facet osteoarthritis. No canal stenosis. Mild foraminal stenosis on the right. IMPRESSION: 1. No acute or traumatic finding. 2. L4-5: Chronic disc protrusion with some midline calcification. Facet arthropathy with facet and ligamentous hypertrophy.  Severe multifactorial stenosis that could cause neural compression on either or both sides. 3. L3-4: Disc degeneration with moderate bulging. Mild facet and ligamentous hypertrophy. Stenosis of both lateral recesses but without definite neural compression. 4. L5-S1: Bilateral facet osteoarthritis. Mild foraminal stenosis on the right. 5. Bilateral renal calculi. Stone in the left extra renal pelvis measuring up to 1.3 cm in size, not obstructive at this time, but which could cause ball valve obstruction. Electronically Signed   By: Paulina Fusi M.D.   On: 10/23/2022 14:27   CT PELVIS WO CONTRAST  Result Date: 10/23/2022 CLINICAL DATA:  Recurrent falls with left hip pain EXAM: CT PELVIS WITHOUT CONTRAST TECHNIQUE: Multidetector CT imaging of the pelvis was performed following the standard protocol without intravenous contrast. RADIATION DOSE REDUCTION: This exam was performed according to the departmental dose-optimization program which includes automated exposure control, adjustment of the mA and/or kV according to patient size and/or use of iterative reconstruction technique. COMPARISON:   Same-day pelvis and left femur radiographs FINDINGS: Urinary Tract: No focal bladder wall thickening. Bowel: Partially imaged bowel without bowel wall thickening, distention, or inflammatory changes. Colonic diverticulosis without acute diverticulitis. Vascular/Lymphatic: Aortic atherosclerosis. No enlarged abdominal or pelvic lymph nodes. Reproductive: Left adnexal simple-appearing cyst measures 2.8 cm (304:51). No right adnexal mass. Other: No free fluid, fluid collection, or free air. Musculoskeletal: No acute or abnormal lytic or blastic osseous lesions. Left hip arthroplasty. Degenerative changes of the right hip. Subcutaneous calcified granulomata in the right posterior proximal thigh. Asymmetric fatty atrophy of the left gluteal muscles. IMPRESSION: 1. No acute fracture or dislocation. 2. Left hip arthroplasty. 3. Left adnexal simple-appearing cyst measures 2.8 cm. No follow-up imaging recommended. 4.  Aortic Atherosclerosis (ICD10-I70.0). Electronically Signed   By: Agustin Cree M.D.   On: 10/23/2022 14:24   DG FEMUR MIN 2 VIEWS LEFT  Result Date: 10/23/2022 CLINICAL DATA:  Larey Seat with left hip and leg pain EXAM: LEFT FEMUR 2 VIEWS COMPARISON:  None Available. FINDINGS: Previous left hip arthroplasty. No abnormal finding relating to that. No injury seen distal to that. There are degenerative changes at the knee joint but no visible knee joint effusion. IMPRESSION: No acute or traumatic finding. Previous left hip arthroplasty. Degenerative changes at the knee joint. Electronically Signed   By: Paulina Fusi M.D.   On: 10/23/2022 13:00   DG Pelvis 1-2 Views  Result Date: 10/23/2022 CLINICAL DATA:  Larey Seat with left hip pain EXAM: PELVIS - 1-2 VIEW COMPARISON:  05/27/2019 FINDINGS: Previous left hip arthroplasty. No traumatic finding in the region. Other bones of the pelvis appear normal. Surgical clips in the right pelvis region. Degenerative change of the lumbar spine. Benign appearing calcification projected  over the proximal femur on the right. IMPRESSION: No acute or traumatic finding. Previous left hip arthroplasty. Electronically Signed   By: Paulina Fusi M.D.   On: 10/23/2022 12:59   CT Cervical Spine Wo Contrast  Result Date: 10/23/2022 CLINICAL DATA:  Larey Seat today with trauma to the back of the head. EXAM: CT CERVICAL SPINE WITHOUT CONTRAST TECHNIQUE: Multidetector CT imaging of the cervical spine was performed without intravenous contrast. Multiplanar CT image reconstructions were also generated. RADIATION DOSE REDUCTION: This exam was performed according to the departmental dose-optimization program which includes automated exposure control, adjustment of the mA and/or kV according to patient size and/or use of iterative reconstruction technique. COMPARISON:  04/24/2019 FINDINGS: Alignment: Mild scoliotic curvature convex to the left. Straightening of the normal cervical lordosis. Skull base and vertebrae: No  evidence of regional fracture or focal bone lesion. Soft tissues and spinal canal: No traumatic soft tissue finding. Disc levels: The foramen magnum is widely patent. There is ordinary mild osteoarthritis of the C1-2 articulation but no encroachment upon the neural structures. C2-3: Normal C3-4: Bilateral uncovertebral hypertrophy right worse than left. Bony foraminal stenosis on the right. C4-5: Right-sided uncovertebral osteophytes and facet degeneration. Right foraminal stenosis. C5-6: Bilateral uncovertebral osteophytes. Moderate foraminal narrowing on the right. Mild foraminal narrowing on the left. C6-7: Bilateral uncovertebral osteophytes. Moderate foraminal narrowing on both sides. C7-T1: Normal interspace. Upper chest: Negative Other: None IMPRESSION: No acute or traumatic finding. Degenerative spondylosis as outlined above. Electronically Signed   By: Paulina Fusi M.D.   On: 10/23/2022 12:58   CT Head Wo Contrast  Result Date: 10/23/2022 CLINICAL DATA:  Larey Seat today with trauma to the back of  the head EXAM: CT HEAD WITHOUT CONTRAST TECHNIQUE: Contiguous axial images were obtained from the base of the skull through the vertex without intravenous contrast. RADIATION DOSE REDUCTION: This exam was performed according to the departmental dose-optimization program which includes automated exposure control, adjustment of the mA and/or kV according to patient size and/or use of iterative reconstruction technique. COMPARISON:  None Available. FINDINGS: Brain: The brain shows a normal appearance without evidence of malformation, atrophy, old or acute small or large vessel infarction, mass lesion, hemorrhage, hydrocephalus or extra-axial collection. Vascular: No hyperdense vessel. No evidence of atherosclerotic calcification. Skull: Normal.  No traumatic finding.  No focal bone lesion. Sinuses/Orbits: Sinuses are clear. Orbits appear normal. Mastoids are clear. Other: None significant IMPRESSION: Normal head CT. No traumatic finding. Electronically Signed   By: Paulina Fusi M.D.   On: 10/23/2022 12:54    Procedures Procedures    Medications Ordered in ED Medications  oxyCODONE-acetaminophen (PERCOCET/ROXICET) 5-325 MG per tablet 1 tablet (1 tablet Oral Given 10/23/22 1253)    ED Course/ Medical Decision Making/ A&P                                 Medical Decision Making Amount and/or Complexity of Data Reviewed Radiology: ordered.  Risk Prescription drug management.   This patient presents to the ED for concern of left hip pain, head pain, fall, this involves an extensive number of treatment options, and is a complaint that carries with it a high risk of complications and morbidity.  The differential diagnosis includes but not limited to fracture, dislocation, intracranial injury, C-spine injury, compression fracture   Co morbidities that complicate the patient evaluation  Left hip replacement, hypothyroid, GERD, kidney stones, asthma   Additional history obtained:  Additional  history obtained from daughter at bedside who contributes to history as above External records from outside source obtained and reviewed including prior history on file   Imaging Studies ordered:  I ordered imaging studies including x-ray left hip, x-ray left femur, lumbar spine CT, pelvic CT, CT head, CT C-spine I independently visualized and interpreted imaging which showed no acute bony abnormality I agree with the radiologist interpretation  Problem List / ED Course / Critical interventions / Medication management  67 year old female brought in by daughter with concern for mechanical fall as above.  Found to have pain over her left hip with history of prior hip replacement.  Imaging reassuring including CTs and x-rays.  Incidental finding of large left renal stone.  Discussed results with patient and daughter, plan to follow-up with urology.  Patient to be discharged with prescription for Norco, discussed precautions.  Recommend follow-up with her PCP, urology, orthopedist. I ordered medication including Norco for pain Reevaluation of the patient after these medicines showed that the patient improved I have reviewed the patients home medicines and have made adjustments as needed   Social Determinants of Health:  Lives alone, has PCP.  Has urologist and orthopedics for follow-up   Test / Admission - Considered:  Stable for discharge         Final Clinical Impression(s) / ED Diagnoses Final diagnoses:  Fall, initial encounter  Left hip pain  Renal stone    Rx / DC Orders ED Discharge Orders          Ordered    HYDROcodone-acetaminophen (NORCO/VICODIN) 5-325 MG tablet  Every 4 hours PRN        10/23/22 1439              Jeannie Fend, PA-C 10/23/22 1442    Rondel Baton, MD 10/23/22 2008

## 2022-10-23 NOTE — Discharge Instructions (Addendum)
Follow-up with your orthopedist.  Follow-up with your urologist.  Return to the emergency room for worsening or concerning symptoms.  Recheck with your primary care provider.  Take Norco as needed as prescribed for pain.  As discussed, this medication can cause constipation, managed with medications as needed.  Do not drive or operate machinery while taking this medication.  Use your walker until gait improves.

## 2022-10-23 NOTE — ED Notes (Signed)
Patient transported to CT 

## 2022-10-23 NOTE — ED Notes (Signed)
ED Provider at bedside. 

## 2022-10-30 DIAGNOSIS — Z9884 Bariatric surgery status: Secondary | ICD-10-CM | POA: Diagnosis not present

## 2022-10-30 DIAGNOSIS — E669 Obesity, unspecified: Secondary | ICD-10-CM | POA: Diagnosis not present

## 2022-10-30 DIAGNOSIS — M5442 Lumbago with sciatica, left side: Secondary | ICD-10-CM | POA: Diagnosis not present

## 2022-10-30 DIAGNOSIS — Z6835 Body mass index (BMI) 35.0-35.9, adult: Secondary | ICD-10-CM | POA: Diagnosis not present

## 2022-10-31 ENCOUNTER — Other Ambulatory Visit: Payer: Self-pay | Admitting: Urology

## 2022-10-31 DIAGNOSIS — N202 Calculus of kidney with calculus of ureter: Secondary | ICD-10-CM | POA: Diagnosis not present

## 2022-11-04 DIAGNOSIS — Z96649 Presence of unspecified artificial hip joint: Secondary | ICD-10-CM | POA: Diagnosis not present

## 2022-11-04 DIAGNOSIS — M25552 Pain in left hip: Secondary | ICD-10-CM | POA: Diagnosis not present

## 2022-11-06 DIAGNOSIS — F5101 Primary insomnia: Secondary | ICD-10-CM | POA: Diagnosis not present

## 2022-11-06 DIAGNOSIS — Z9884 Bariatric surgery status: Secondary | ICD-10-CM | POA: Diagnosis not present

## 2022-11-06 DIAGNOSIS — G4733 Obstructive sleep apnea (adult) (pediatric): Secondary | ICD-10-CM | POA: Diagnosis not present

## 2022-11-06 DIAGNOSIS — N200 Calculus of kidney: Secondary | ICD-10-CM | POA: Diagnosis not present

## 2022-11-06 DIAGNOSIS — Z23 Encounter for immunization: Secondary | ICD-10-CM | POA: Diagnosis not present

## 2022-11-06 DIAGNOSIS — E781 Pure hyperglyceridemia: Secondary | ICD-10-CM | POA: Diagnosis not present

## 2022-11-06 DIAGNOSIS — E039 Hypothyroidism, unspecified: Secondary | ICD-10-CM | POA: Diagnosis not present

## 2022-11-06 DIAGNOSIS — K760 Fatty (change of) liver, not elsewhere classified: Secondary | ICD-10-CM | POA: Diagnosis not present

## 2022-11-06 DIAGNOSIS — E559 Vitamin D deficiency, unspecified: Secondary | ICD-10-CM | POA: Diagnosis not present

## 2022-11-06 DIAGNOSIS — J4530 Mild persistent asthma, uncomplicated: Secondary | ICD-10-CM | POA: Diagnosis not present

## 2022-11-06 DIAGNOSIS — I700 Atherosclerosis of aorta: Secondary | ICD-10-CM | POA: Diagnosis not present

## 2022-12-03 ENCOUNTER — Encounter (HOSPITAL_BASED_OUTPATIENT_CLINIC_OR_DEPARTMENT_OTHER): Payer: Self-pay | Admitting: Urology

## 2022-12-03 NOTE — Progress Notes (Addendum)
Spoke w/ via phone for pre-op interview--- Rachel [name] Lab needs dos---- NONE        Lab results------ COVID test -----patient states asymptomatic no test needed Arrive at -------0530 NPO after MN NO Solid Food.  Med rec completed Medications to take morning of surgery -----Flovent, Prozac, Levothyroxine and Protonix. Diabetic medication ----- Patient instructed no nail polish to be worn day of surgery Patient instructed to bring photo id and insurance card day of surgery Patient aware to have Driver (ride ) / caregiver    for 24 hours after surgery - Rachel [name]. Patient Special Instructions ----- Pre-Op special Instructions ----- Patient verbalized understanding of instructions that were given at this phone interview. Patient denies chest pain, sob, fever, cough at the interview.   Per patient instructions to hold ASA 7 days prior to surgery.

## 2022-12-09 NOTE — Anesthesia Preprocedure Evaluation (Signed)
Anesthesia Evaluation  Patient identified by MRN, date of birth, ID band Patient awake    Reviewed: Allergy & Precautions, NPO status , Patient's Chart, lab work & pertinent test results  History of Anesthesia Complications (+) PONV and history of anesthetic complications  Airway Mallampati: II  TM Distance: >3 FB Neck ROM: Full    Dental no notable dental hx.    Pulmonary asthma , sleep apnea    Pulmonary exam normal        Cardiovascular negative cardio ROS  Rhythm:Regular Rate:Normal     Neuro/Psych  Headaches  negative psych ROS   GI/Hepatic Neg liver ROS, hiatal hernia,GERD  ,,  Endo/Other  Hypothyroidism    Renal/GU Renal disease  negative genitourinary   Musculoskeletal  (+) Arthritis , Osteoarthritis,    Abdominal Normal abdominal exam  (+)   Peds  Hematology   Anesthesia Other Findings   Reproductive/Obstetrics                             Anesthesia Physical Anesthesia Plan  ASA: 2  Anesthesia Plan: General   Post-op Pain Management: Tylenol PO (pre-op)*   Induction: Intravenous  PONV Risk Score and Plan: 3 and Ondansetron, Dexamethasone and Treatment may vary due to age or medical condition  Airway Management Planned: Mask and Oral ETT  Additional Equipment: None  Intra-op Plan:   Post-operative Plan: Extubation in OR  Informed Consent: I have reviewed the patients History and Physical, chart, labs and discussed the procedure including the risks, benefits and alternatives for the proposed anesthesia with the patient or authorized representative who has indicated his/her understanding and acceptance.     Dental advisory given  Plan Discussed with: CRNA  Anesthesia Plan Comments:        Anesthesia Quick Evaluation

## 2022-12-10 ENCOUNTER — Encounter (HOSPITAL_BASED_OUTPATIENT_CLINIC_OR_DEPARTMENT_OTHER)
Admission: RE | Disposition: A | Discharge status: Home / Self Care | Payer: Self-pay | Source: Home / Self Care | Attending: Urology

## 2022-12-10 ENCOUNTER — Ambulatory Visit (HOSPITAL_BASED_OUTPATIENT_CLINIC_OR_DEPARTMENT_OTHER)
Admission: RE | Admit: 2022-12-10 | Discharge: 2022-12-10 | Disposition: A | Discharge status: Home / Self Care | Payer: Medicare PPO | Attending: Urology | Admitting: Urology

## 2022-12-10 ENCOUNTER — Encounter (HOSPITAL_BASED_OUTPATIENT_CLINIC_OR_DEPARTMENT_OTHER): Payer: Self-pay | Admitting: Urology

## 2022-12-10 ENCOUNTER — Ambulatory Visit (HOSPITAL_BASED_OUTPATIENT_CLINIC_OR_DEPARTMENT_OTHER): Payer: Medicare PPO | Admitting: Anesthesiology

## 2022-12-10 ENCOUNTER — Ambulatory Visit (HOSPITAL_BASED_OUTPATIENT_CLINIC_OR_DEPARTMENT_OTHER): Payer: Self-pay | Admitting: Anesthesiology

## 2022-12-10 ENCOUNTER — Other Ambulatory Visit: Payer: Self-pay

## 2022-12-10 DIAGNOSIS — K449 Diaphragmatic hernia without obstruction or gangrene: Secondary | ICD-10-CM | POA: Diagnosis not present

## 2022-12-10 DIAGNOSIS — K219 Gastro-esophageal reflux disease without esophagitis: Secondary | ICD-10-CM | POA: Diagnosis not present

## 2022-12-10 DIAGNOSIS — Z87442 Personal history of urinary calculi: Secondary | ICD-10-CM | POA: Diagnosis not present

## 2022-12-10 DIAGNOSIS — Z7982 Long term (current) use of aspirin: Secondary | ICD-10-CM | POA: Diagnosis not present

## 2022-12-10 DIAGNOSIS — N200 Calculus of kidney: Secondary | ICD-10-CM

## 2022-12-10 DIAGNOSIS — Z79890 Hormone replacement therapy (postmenopausal): Secondary | ICD-10-CM | POA: Diagnosis not present

## 2022-12-10 DIAGNOSIS — M1990 Unspecified osteoarthritis, unspecified site: Secondary | ICD-10-CM | POA: Insufficient documentation

## 2022-12-10 DIAGNOSIS — E039 Hypothyroidism, unspecified: Secondary | ICD-10-CM | POA: Diagnosis not present

## 2022-12-10 DIAGNOSIS — Z79899 Other long term (current) drug therapy: Secondary | ICD-10-CM | POA: Insufficient documentation

## 2022-12-10 DIAGNOSIS — N132 Hydronephrosis with renal and ureteral calculous obstruction: Secondary | ICD-10-CM | POA: Insufficient documentation

## 2022-12-10 DIAGNOSIS — G4730 Sleep apnea, unspecified: Secondary | ICD-10-CM | POA: Diagnosis not present

## 2022-12-10 DIAGNOSIS — N201 Calculus of ureter: Secondary | ICD-10-CM | POA: Diagnosis not present

## 2022-12-10 DIAGNOSIS — J45909 Unspecified asthma, uncomplicated: Secondary | ICD-10-CM | POA: Insufficient documentation

## 2022-12-10 HISTORY — PX: CYSTOSCOPY/URETEROSCOPY/HOLMIUM LASER/STENT PLACEMENT: SHX6546

## 2022-12-10 HISTORY — DX: Other specified postprocedural states: Z98.890

## 2022-12-10 SURGERY — CYSTOSCOPY/URETEROSCOPY/HOLMIUM LASER/STENT PLACEMENT
Anesthesia: General | Site: Ureter | Laterality: Left

## 2022-12-10 MED ORDER — PROPOFOL 10 MG/ML IV BOLUS
INTRAVENOUS | Status: DC | PRN
  Administered 2022-12-10: 30 mg via INTRAVENOUS
  Administered 2022-12-10: 140 mg via INTRAVENOUS

## 2022-12-10 MED ORDER — ACETAMINOPHEN 500 MG PO TABS
ORAL_TABLET | ORAL | Status: AC
  Filled 2022-12-10: qty 2

## 2022-12-10 MED ORDER — FENTANYL CITRATE (PF) 100 MCG/2ML IJ SOLN: 25.0000 ug | INTRAMUSCULAR | Status: DC | PRN

## 2022-12-10 MED ORDER — SULFAMETHOXAZOLE-TRIMETHOPRIM 800-160 MG PO TABS: 1.0000 | ORAL_TABLET | Freq: Two times a day (BID) | ORAL | 0 refills | Status: DC

## 2022-12-10 MED ORDER — CIPROFLOXACIN IN D5W 400 MG/200ML IV SOLN
INTRAVENOUS | Status: AC
  Filled 2022-12-10: qty 200

## 2022-12-10 MED ORDER — STERILE WATER FOR IRRIGATION IR SOLN
Status: DC | PRN
  Administered 2022-12-10: 1000 mL

## 2022-12-10 MED ORDER — OXYCODONE HCL 5 MG PO TABS: 5.0000 mg | ORAL_TABLET | Freq: Once | ORAL | Status: DC | PRN

## 2022-12-10 MED ORDER — CELECOXIB 200 MG PO CAPS: 200.0000 mg | ORAL_CAPSULE | Freq: Two times a day (BID) | ORAL | 1 refills | Status: AC | PRN

## 2022-12-10 MED ORDER — HYOSCYAMINE SULFATE 0.125 MG SL SUBL: 0.1250 mg | SUBLINGUAL_TABLET | SUBLINGUAL | 0 refills | Status: DC | PRN

## 2022-12-10 MED ORDER — LIDOCAINE 2% (20 MG/ML) 5 ML SYRINGE
INTRAMUSCULAR | Status: DC | PRN
  Administered 2022-12-10: 60 mg via INTRAVENOUS

## 2022-12-10 MED ORDER — LACTATED RINGERS IV SOLN: INTRAVENOUS | Status: DC

## 2022-12-10 MED ORDER — FENTANYL CITRATE (PF) 100 MCG/2ML IJ SOLN
INTRAMUSCULAR | Status: AC
  Filled 2022-12-10: qty 2

## 2022-12-10 MED ORDER — FENTANYL CITRATE (PF) 100 MCG/2ML IJ SOLN
INTRAMUSCULAR | Status: DC | PRN
  Administered 2022-12-10 (×2): 25 ug via INTRAVENOUS
  Administered 2022-12-10: 50 ug via INTRAVENOUS

## 2022-12-10 MED ORDER — LIDOCAINE HCL (PF) 2 % IJ SOLN
INTRAMUSCULAR | Status: AC
  Filled 2022-12-10: qty 5

## 2022-12-10 MED ORDER — ONDANSETRON HCL 4 MG/2ML IJ SOLN
INTRAMUSCULAR | Status: DC | PRN
  Administered 2022-12-10: 4 mg via INTRAVENOUS

## 2022-12-10 MED ORDER — ONDANSETRON HCL 4 MG/2ML IJ SOLN
INTRAMUSCULAR | Status: AC
  Filled 2022-12-10: qty 2

## 2022-12-10 MED ORDER — CIPROFLOXACIN IN D5W 400 MG/200ML IV SOLN
400.0000 mg | INTRAVENOUS | Status: AC
Start: 2022-12-10 — End: 2022-12-10
  Administered 2022-12-10: 400 mg via INTRAVENOUS

## 2022-12-10 MED ORDER — PHENYLEPHRINE 80 MCG/ML (10ML) SYRINGE FOR IV PUSH (FOR BLOOD PRESSURE SUPPORT)
PREFILLED_SYRINGE | INTRAVENOUS | Status: DC | PRN
  Administered 2022-12-10 (×2): 80 ug via INTRAVENOUS

## 2022-12-10 MED ORDER — SODIUM CHLORIDE 0.9 % IV SOLN: INTRAVENOUS | Status: DC

## 2022-12-10 MED ORDER — EPHEDRINE 5 MG/ML INJ
INTRAVENOUS | Status: AC
  Filled 2022-12-10: qty 5

## 2022-12-10 MED ORDER — SODIUM CHLORIDE 0.9 % IR SOLN
Status: DC | PRN
  Administered 2022-12-10: 3000 mL

## 2022-12-10 MED ORDER — ACETAMINOPHEN 500 MG PO TABS
1000.0000 mg | ORAL_TABLET | Freq: Once | ORAL | Status: AC
  Administered 2022-12-10: 1000 mg via ORAL

## 2022-12-10 MED ORDER — TRAMADOL HCL 50 MG PO TABS: 50.0000 mg | ORAL_TABLET | Freq: Four times a day (QID) | ORAL | 0 refills | Status: AC | PRN

## 2022-12-10 MED ORDER — IOHEXOL 300 MG/ML  SOLN
INTRAMUSCULAR | Status: DC | PRN
  Administered 2022-12-10: 16 mL via URETHRAL

## 2022-12-10 MED ORDER — OXYCODONE HCL 5 MG/5ML PO SOLN: 5.0000 mg | Freq: Once | ORAL | Status: DC | PRN

## 2022-12-10 MED ORDER — MIDAZOLAM HCL 2 MG/2ML IJ SOLN
INTRAMUSCULAR | Status: AC
  Filled 2022-12-10: qty 2

## 2022-12-10 MED ORDER — DROPERIDOL 2.5 MG/ML IJ SOLN: 0.6250 mg | Freq: Once | INTRAMUSCULAR | Status: DC | PRN

## 2022-12-10 MED ORDER — PHENYLEPHRINE 80 MCG/ML (10ML) SYRINGE FOR IV PUSH (FOR BLOOD PRESSURE SUPPORT)
PREFILLED_SYRINGE | INTRAVENOUS | Status: AC
  Filled 2022-12-10: qty 10

## 2022-12-10 MED ORDER — EPHEDRINE SULFATE-NACL 50-0.9 MG/10ML-% IV SOSY
PREFILLED_SYRINGE | INTRAVENOUS | Status: DC | PRN
  Administered 2022-12-10: 10 mg via INTRAVENOUS
  Administered 2022-12-10: 15 mg via INTRAVENOUS

## 2022-12-10 MED ORDER — PROPOFOL 10 MG/ML IV BOLUS
INTRAVENOUS | Status: AC
  Filled 2022-12-10: qty 20

## 2022-12-10 MED ORDER — MIDAZOLAM HCL 5 MG/5ML IJ SOLN
INTRAMUSCULAR | Status: DC | PRN
  Administered 2022-12-10: 2 mg via INTRAVENOUS

## 2022-12-10 MED ORDER — DEXAMETHASONE SODIUM PHOSPHATE 10 MG/ML IJ SOLN
INTRAMUSCULAR | Status: AC
  Filled 2022-12-10: qty 1

## 2022-12-10 MED ORDER — DEXAMETHASONE SODIUM PHOSPHATE 10 MG/ML IJ SOLN
INTRAMUSCULAR | Status: DC | PRN
  Administered 2022-12-10: 5 mg via INTRAVENOUS

## 2022-12-10 SURGICAL SUPPLY — 22 items
BAG DRAIN URO-CYSTO SKYTR STRL (DRAIN) ×1 IMPLANT
CATH URETERAL DUAL LUMEN 10F (MISCELLANEOUS) IMPLANT
CATH URETL OPEN END 6FR 70 (CATHETERS) ×1 IMPLANT
CLOTH BEACON ORANGE TIMEOUT ST (SAFETY) ×1 IMPLANT
COVER DOME SNAP 22 D (MISCELLANEOUS) ×1 IMPLANT
DRSG TEGADERM 4X4.75 (GAUZE/BANDAGES/DRESSINGS) IMPLANT
GLOVE BIO SURGEON STRL SZ7 (GLOVE) ×1 IMPLANT
GLOVE BIOGEL PI IND STRL 6 (GLOVE) IMPLANT
GOWN STRL REUS W/TWL LRG LVL3 (GOWN DISPOSABLE) ×1 IMPLANT
GUIDEWIRE STR DUAL SENSOR (WIRE) IMPLANT
GUIDEWIRE ZIPWRE .038 STRAIGHT (WIRE) IMPLANT
IV NS IRRIG 3000ML ARTHROMATIC (IV SOLUTION) ×2 IMPLANT
KIT TURNOVER CYSTO (KITS) ×1 IMPLANT
MANIFOLD NEPTUNE II (INSTRUMENTS) ×1 IMPLANT
PACK CYSTO (CUSTOM PROCEDURE TRAY) ×1 IMPLANT
SLEEVE SCD COMPRESS KNEE MED (STOCKING) ×1 IMPLANT
STENT URET 6FRX24 CONTOUR (STENTS) IMPLANT
STENT URET 6FRX26 CONTOUR (STENTS) IMPLANT
TRACTIP FLEXIVA PULS ID 200XHI (Laser) IMPLANT
TUBE CONNECTING 12X1/4 (SUCTIONS) IMPLANT
TUBING UROLOGY SET (TUBING) IMPLANT
WATER STERILE IRR 1000ML POUR (IV SOLUTION) IMPLANT

## 2022-12-10 NOTE — H&P (Signed)
H&P  History of Present Illness: Rachel [name] is a 67 y.o. year old F who presents today for treatment of a left renal stones  No acute complaints  Past Medical History:  Diagnosis Date   Allergic rhinitis    GERD (gastroesophageal reflux disease)    H/O bariatric surgery 10/2016   History of hiatal hernia    History of kidney stones    Hypothyroidism    Migraines    Mild asthma    OA (osteoarthritis)    PONV (postoperative nausea and vomiting)    Recurrent upper respiratory infection (URI)    Renal calculus, bilateral    Wears glasses     Past Surgical History:  Procedure Laterality Date   ANKLE ARTHROSCOPY Left 01-27-2002    dr Elita Quick  [facility]   BREAST SURGERY     lumpectomy x 2 - benign    CARPAL TUNNEL RELEASE Bilateral right 08-23-2003   dr sypher [facility]/  left 2006 approx.   CYSTOSCOPY WITH RETROGRADE PYELOGRAM, URETEROSCOPY AND STENT PLACEMENT Bilateral 01/20/2017   Procedure: CYSTOSCOPY WITH RETROGRADE PYELOGRAM, URETEROSCOPY AND STENT PLACEMENT;  Surgeon: Malen Gauze, MD;  Location: [facility];  Service: Urology;  Laterality: Bilateral;   DILATION AND CURETTAGE OF UTERUS     several    ESOPHAGOGASTRODUODENOSCOPY  09/2016   HOLMIUM LASER APPLICATION Bilateral 01/20/2017   Procedure: HOLMIUM LASER APPLICATION;  Surgeon: Malen Gauze, MD;  Location: [facility];  Service: Urology;  Laterality: Bilateral;   KNEE ARTHROSCOPY WITH MEDIAL MENISECTOMY Right 06/08/2013   Procedure: RIGHT KNEE ARTHROSCOPY WITH LATERAL MENISECTOMY with ABRASION CHRONDROPLASTY and SUPRAPATELLAR SYNOVECTOMY;  Surgeon: Jacki Cones, MD;  Location: [facility];  Service: Orthopedics;  Laterality: Right;   LAPAROSCOPIC CHOLECYSTECTOMY  1996   LAPAROSCOPIC GASTRIC RESTRICTIVE DUODENAL PROCEDURE (DUODENAL SWITCH)  11-11-2016     [facility] in [place] , [place]   AND HIATAL HERNIA REPAIR   TONSILLECTOMY  child   TOTAL HIP ARTHROPLASTY Left 04-02-2006   dr Darrelyn Hillock   [facility]    TOTAL KNEE ARTHROPLASTY Right 01/25/2015   Procedure: TOTAL KNEE ARTHROPLASTY;  Surgeon: Ranee Gosselin, MD;  Location: [facility];  Service: Orthopedics;  Laterality: Right;    Home Medications:  Current Meds  Medication Sig   Ascorbic Acid (VITA-C PO) Take by mouth.   aspirin EC 81 MG tablet Take 81 mg by mouth daily. Swallow whole.   atorvastatin (LIPITOR) 10 MG tablet Take 10 mg by mouth daily.   calcium citrate-vitamin D 500-500 MG-UNIT chewable tablet Chew 2 tablets by mouth daily.   docusate sodium (COLACE) 100 MG capsule Take 100 mg by mouth 2 (two) times daily.   ferrous sulfate 324 MG TBEC Take 324 mg by mouth daily with breakfast.   fexofenadine (ALLEGRA) 180 MG tablet Take 180 mg by mouth daily.   FLUoxetine (PROZAC) 20 MG capsule 1 capsule   fluticasone (FLONASE) 50 MCG/ACT nasal spray Place 2 sprays into both nostrils at bedtime.    gabapentin (NEURONTIN) 300 MG capsule Take 1,200 mg by mouth 3 (three) times daily.   levothyroxine (SYNTHROID, LEVOTHROID) 137 MCG tablet Take 50 mcg by mouth daily before breakfast.   Multiple Vitamin (MULTIVITAMIN) tablet Take 1 tablet by mouth daily. Celebrity nuitriional supplement multiple vitaman complete chews   pantoprazole (PROTONIX) 40 MG tablet Take 40 mg by mouth daily.   traZODone (DESYREL) 50 MG tablet Take 50 mg by mouth at bedtime. 1-2 tabs at bedtime    Allergies:  Allergies  Allergen Reactions   Bee Venom Anaphylaxis   Codeine Itching   Guaifenesin & Derivatives Anaphylaxis   Molds & Smuts Shortness Of Breath    Nose will burn   Pollen Extract     Other reaction(s): Eye Irritation Itchy Eyes, Burning Eyes, Sneezing    Family History  Problem Relation Age of Onset   Asthma Mother    Asthma Sister    Urticaria Neg Hx    Eczema Neg Hx    Allergic rhinitis Neg Hx     Social History:  reports that she has never smoked. She has never used smokeless tobacco. She reports current alcohol use. She reports that she does not  use drugs.  ROS: A complete review of systems was performed.  All systems are negative except for pertinent findings as noted.  Physical Exam:  Vital signs in last 24 hours: Temp:  [97.5 F (36.4 C)] 97.5 F (36.4 C) (11/19 0630) Pulse Rate:  [61] 61 (11/19 0630) Resp:  [17] 17 (11/19 0630) BP: (132)/(79) 132/79 (11/19 0630) SpO2:  [97 %] 97 % (11/19 0630) Weight:  [93.4 kg] 93.4 kg (11/19 0630) Constitutional:  Alert and oriented, No acute distress Cardiovascular: Regular rate and rhythm Respiratory: Normal respiratory effort, Lungs clear bilaterally GI: Abdomen is soft, nontender, nondistended, no abdominal masses Lymphatic: No lymphadenopathy Neurologic: Grossly intact, no focal deficits Psychiatric: Normal mood and affect   Laboratory Data:  No results for input(s): "WBC", "HGB", "HCT", "PLT" in the last 72 hours.  No results for input(s): "NA", "K", "CL", "GLUCOSE", "BUN", "CALCIUM", "CREATININE" in the last 72 hours.  Invalid input(s): "CO3"   No results found for this or any previous visit (from the past 24 hour(s)). No results found for this or any previous visit (from the past 240 hour(s)).  Renal Function: No results for input(s): "CREATININE" in the last 168 hours. CrCl cannot be calculated (Patient's most recent lab result is older than the maximum 21 days allowed.).  Radiologic Imaging: No results found.  Assessment:  Rachel [name] is a 67 y.o. year old F with left renal stones  Plan:  --to OR as planned for left staged ureteroscopy with laser litho, stent to address her substantial left stone burden. Procedure and risks reviewed, including but not limited to hematuria, infection, sepsis, damage to GU tract, failure to complete procedure, retained stone fragments, need for future procedure(s), stent pain, prolonged stent.   Irine Seal, MD 12/10/2022, 7:37 AM  [facility] Pager: [phone]

## 2022-12-10 NOTE — Transfer of Care (Signed)
Immediate Anesthesia Transfer of Care Note  Patient: Rachel [name]  Procedure(s) Performed: FIRST STAGE LEFT URETEROSCOPY/HOLMIUM LASER/RETROGRADE PYELOGRAM/STENT PLACEMENT (Left: Ureter)  Patient Location: PACU  Anesthesia Type:General  Level of Consciousness: awake, alert , oriented, and patient cooperative  Airway & Oxygen Therapy: Patient Spontanous Breathing and Patient connected to face mask oxygen  Post-op Assessment: Report given to RN and Post -op Vital signs reviewed and stable  Post vital signs: Reviewed and stable  Last Vitals:  Vitals Value Taken Time  BP 127/67 12/10/22 0900  Temp    Pulse 69 12/10/22 0902  Resp 12 12/10/22 0902  SpO2 100 % 12/10/22 0902  Vitals shown include unfiled device data.  Last Pain:  Vitals:   12/10/22 0630  TempSrc: Oral  PainSc: 2       Patients Stated Pain Goal: 4 (12/10/22 0630)  Complications: No notable events documented.

## 2022-12-10 NOTE — Anesthesia Postprocedure Evaluation (Signed)
Anesthesia Post Note  Patient: Rachel [name]  Procedure(s) Performed: FIRST STAGE LEFT URETEROSCOPY/HOLMIUM LASER/RETROGRADE PYELOGRAM/STENT PLACEMENT (Left: Ureter)     Patient location during evaluation: PACU Anesthesia Type: General Level of consciousness: awake and alert Pain management: pain level controlled Vital Signs Assessment: post-procedure vital signs reviewed and stable Respiratory status: spontaneous breathing, nonlabored ventilation, respiratory function stable and patient connected to nasal cannula oxygen Cardiovascular status: blood pressure returned to baseline and stable Postop Assessment: no apparent nausea or vomiting Anesthetic complications: no   No notable events documented.  Last Vitals:  Vitals:   12/10/22 0930 12/10/22 1000  BP: 120/70 117/64  Pulse: 66 (!) 52  Resp: 14 16  Temp:  (!) 36.3 C  SpO2: 94% 98%    Last Pain:  Vitals:   12/10/22 1000  TempSrc:   PainSc: 0-No pain                 Earl Lites P Timesha Cervantez

## 2022-12-10 NOTE — Anesthesia Procedure Notes (Signed)
Procedure Name: LMA Insertion Date/Time: 12/10/2022 7:52 AM  Performed by: Bishop Limbo, CRNAPre-anesthesia Checklist: Patient identified, Emergency Drugs available, Suction available and Patient being monitored Patient Re-evaluated:Patient Re-evaluated prior to induction Oxygen Delivery Method: Circle System Utilized Preoxygenation: Pre-oxygenation with 100% oxygen Induction Type: IV induction Ventilation: Mask ventilation without difficulty LMA: LMA inserted LMA Size: 4.0 Number of attempts: 1 Placement Confirmation: positive ETCO2 Tube secured with: Tape Dental Injury: Teeth and Oropharynx as per pre-operative assessment

## 2022-12-10 NOTE — H&P (View-Only) (Signed)
 H&P  History of Present Illness: Rachel [name] is a 67 y.o. year old F who presents today for treatment of a left renal stones  No acute complaints  Past Medical History:  Diagnosis Date   Allergic rhinitis    GERD (gastroesophageal reflux disease)    H/O bariatric surgery 10/2016   History of hiatal hernia    History of kidney stones    Hypothyroidism    Migraines    Mild asthma    OA (osteoarthritis)    PONV (postoperative nausea and vomiting)    Recurrent upper respiratory infection (URI)    Renal calculus, bilateral    Wears glasses     Past Surgical History:  Procedure Laterality Date   ANKLE ARTHROSCOPY Left 01-27-2002    dr Elita Quick  [facility]   BREAST SURGERY     lumpectomy x 2 - benign    CARPAL TUNNEL RELEASE Bilateral right 08-23-2003   dr sypher [facility]/  left 2006 approx.   CYSTOSCOPY WITH RETROGRADE PYELOGRAM, URETEROSCOPY AND STENT PLACEMENT Bilateral 01/20/2017   Procedure: CYSTOSCOPY WITH RETROGRADE PYELOGRAM, URETEROSCOPY AND STENT PLACEMENT;  Surgeon: Malen Gauze, MD;  Location: [facility];  Service: Urology;  Laterality: Bilateral;   DILATION AND CURETTAGE OF UTERUS     several    ESOPHAGOGASTRODUODENOSCOPY  09/2016   HOLMIUM LASER APPLICATION Bilateral 01/20/2017   Procedure: HOLMIUM LASER APPLICATION;  Surgeon: Malen Gauze, MD;  Location: [facility];  Service: Urology;  Laterality: Bilateral;   KNEE ARTHROSCOPY WITH MEDIAL MENISECTOMY Right 06/08/2013   Procedure: RIGHT KNEE ARTHROSCOPY WITH LATERAL MENISECTOMY with ABRASION CHRONDROPLASTY and SUPRAPATELLAR SYNOVECTOMY;  Surgeon: Jacki Cones, MD;  Location: [facility];  Service: Orthopedics;  Laterality: Right;   LAPAROSCOPIC CHOLECYSTECTOMY  1996   LAPAROSCOPIC GASTRIC RESTRICTIVE DUODENAL PROCEDURE (DUODENAL SWITCH)  11-11-2016     [facility] in [place] , [place]   AND HIATAL HERNIA REPAIR   TONSILLECTOMY  child   TOTAL HIP ARTHROPLASTY Left 04-02-2006   dr Darrelyn Hillock   [facility]    TOTAL KNEE ARTHROPLASTY Right 01/25/2015   Procedure: TOTAL KNEE ARTHROPLASTY;  Surgeon: Ranee Gosselin, MD;  Location: [facility];  Service: Orthopedics;  Laterality: Right;    Home Medications:  Current Meds  Medication Sig   Ascorbic Acid (VITA-C PO) Take by mouth.   aspirin EC 81 MG tablet Take 81 mg by mouth daily. Swallow whole.   atorvastatin (LIPITOR) 10 MG tablet Take 10 mg by mouth daily.   calcium citrate-vitamin D 500-500 MG-UNIT chewable tablet Chew 2 tablets by mouth daily.   docusate sodium (COLACE) 100 MG capsule Take 100 mg by mouth 2 (two) times daily.   ferrous sulfate 324 MG TBEC Take 324 mg by mouth daily with breakfast.   fexofenadine (ALLEGRA) 180 MG tablet Take 180 mg by mouth daily.   FLUoxetine (PROZAC) 20 MG capsule 1 capsule   fluticasone (FLONASE) 50 MCG/ACT nasal spray Place 2 sprays into both nostrils at bedtime.    gabapentin (NEURONTIN) 300 MG capsule Take 1,200 mg by mouth 3 (three) times daily.   levothyroxine (SYNTHROID, LEVOTHROID) 137 MCG tablet Take 50 mcg by mouth daily before breakfast.   Multiple Vitamin (MULTIVITAMIN) tablet Take 1 tablet by mouth daily. Celebrity nuitriional supplement multiple vitaman complete chews   pantoprazole (PROTONIX) 40 MG tablet Take 40 mg by mouth daily.   traZODone (DESYREL) 50 MG tablet Take 50 mg by mouth at bedtime. 1-2 tabs at bedtime    Allergies:  Allergies  Allergen Reactions   Bee Venom Anaphylaxis   Codeine Itching   Guaifenesin & Derivatives Anaphylaxis   Molds & Smuts Shortness Of Breath    Nose will burn   Pollen Extract     Other reaction(s): Eye Irritation Itchy Eyes, Burning Eyes, Sneezing    Family History  Problem Relation Age of Onset   Asthma Mother    Asthma Sister    Urticaria Neg Hx    Eczema Neg Hx    Allergic rhinitis Neg Hx     Social History:  reports that she has never smoked. She has never used smokeless tobacco. She reports current alcohol use. She reports that she does not  use drugs.  ROS: A complete review of systems was performed.  All systems are negative except for pertinent findings as noted.  Physical Exam:  Vital signs in last 24 hours: Temp:  [97.5 F (36.4 C)] 97.5 F (36.4 C) (11/19 0630) Pulse Rate:  [61] 61 (11/19 0630) Resp:  [17] 17 (11/19 0630) BP: (132)/(79) 132/79 (11/19 0630) SpO2:  [97 %] 97 % (11/19 0630) Weight:  [93.4 kg] 93.4 kg (11/19 0630) Constitutional:  Alert and oriented, No acute distress Cardiovascular: Regular rate and rhythm Respiratory: Normal respiratory effort, Lungs clear bilaterally GI: Abdomen is soft, nontender, nondistended, no abdominal masses Lymphatic: No lymphadenopathy Neurologic: Grossly intact, no focal deficits Psychiatric: Normal mood and affect   Laboratory Data:  No results for input(s): "WBC", "HGB", "HCT", "PLT" in the last 72 hours.  No results for input(s): "NA", "K", "CL", "GLUCOSE", "BUN", "CALCIUM", "CREATININE" in the last 72 hours.  Invalid input(s): "CO3"   No results found for this or any previous visit (from the past 24 hour(s)). No results found for this or any previous visit (from the past 240 hour(s)).  Renal Function: No results for input(s): "CREATININE" in the last 168 hours. CrCl cannot be calculated (Patient's most recent lab result is older than the maximum 21 days allowed.).  Radiologic Imaging: No results found.  Assessment:  Rachel [name] is a 67 y.o. year old F with left renal stones  Plan:  --to OR as planned for left staged ureteroscopy with laser litho, stent to address her substantial left stone burden. Procedure and risks reviewed, including but not limited to hematuria, infection, sepsis, damage to GU tract, failure to complete procedure, retained stone fragments, need for future procedure(s), stent pain, prolonged stent.   Irine Seal, MD 12/10/2022, 7:37 AM  [facility] Pager: [phone]

## 2022-12-10 NOTE — Op Note (Signed)
Operative Note  Preoperative diagnosis:  1.  Left renal stones  Postoperative diagnosis: 1.  same  Procedure(s): 1.  Left ureteroscopy with laser lithotripsy of stones 2. Cystoscopy  3. Left retrograde pyelogram 4. Left ureteral stent placement 5. Fluoroscopy with intraoperative interpretation  Surgeon: Irine Seal, MD  Assistants:  None  Anesthesia:  General  Complications:  None  EBL:  Minimal  Specimens: 1. none  Drains/Catheters: 1.  Left 6Fr x 26cm ureteral stent without tether string  Intraoperative findings:   Cystoscopy demonstrated unremarkable bladder Left Ureteroscopy demonstrated large stone in renal pelvis, medium sized stone in upper pole, small stones in lower pole Successful stent placement.  Indication:  Rachel [name] is a 67 y.o. female with the above diagnoses. Plans were made for staged ureteroscopy given substantial stone burden  Description of procedure: After informed consent was obtained from the patient, the patient was identified and taken to the operating room and placed in the supine position.  General anesthesia was administered as well as perioperative IV antibiotics.  At the beginning of the case, a time-out was performed to properly identify the patient, the surgery to be performed, and the surgical site.  Sequential compression devices were applied to the lower extremities at the beginning of the case for DVT prophylaxis.  The patient was then placed in the dorsal lithotomy supine position, prepped and draped in sterile fashion.  We then passed the 21-French rigid cystoscope through the urethra and into the bladder under vision without any difficulty, noting a normal urethra with some bladder prolapse.  A systematic evaluation of the bladder revealed no evidence of any suspicious bladder lesions.  Ureteral orifices were in normal position.    We directed our attention to the left UO; we then passed a 0.038 glide wire up to the level of the  renal pelvis.  The cystoscope was withdrawn, and a dual lumen catheter was inserted over the glide wire into the distal ureter. A gentle retrograde pyelogram was performed, revealing a normal caliber ureter without any filling defects. There was mild hydronephrosis of the collecting system. There was a filling defect in the renal pelvis corresponding to the stone. A 0.038 sensor wire was then passed up to the level of the renal pelvis and secured to the drape as a safety wire. The dual lumen was removed.  The flexible ureteroscope was advanced into the collecting system via the glide wire. The collecting system was inspected. The calculus was identified at the renal pelvis. Using the 242 micron holmium laser fiber, the stone was dusted. With the ureteroscope in the kidney, a gentle pyelogram was performed to delineate the calyceal system and we evaluated the calyces systematically. We encountered more stones in the upper pole which were dusted. The calyces were re-inspected; visualization was somewhat limited by the degree of stone dust. While I did not identify any further sizable fragments, I feel it is likely there are some still present given the size of her initial stones  We then withdrew the ureteroscope back down the ureter, noting no evidence of any stones along the course of the ureter.   Once the ureteroscope was removed, we then used the Glidewire under fluoroscopic guidance and passed up a 6-French x 26 cm double-pigtail ureteral stent up the ureter, making sure that the proximal and distal ends coiled within the kidney and bladder respectively.  The cystoscope was then advanced back into the bladder under vision.  We were able to see the distal stent  coiling nicely within the bladder.  The bladder was then emptied with irrigation solution.  The cystoscope was then removed.    The patient tolerated the procedure well and there was no complication. Patient was awoken from anesthesia and taken to  the recovery room in stable condition. I was present and scrubbed for the entirety of the case.  Plan:  Patient will be discharged home. As mentioned above, substantial progress was made on the patient's large stone burden today but I believe there are likely still sizable fragments left that I was not able to see due to stone debris. Will proceed with staged ureteroscopy in 2 weeks   G. Irine Seal MD [facility]  Pager: [phone]

## 2022-12-10 NOTE — Discharge Instructions (Addendum)
[facility] [phone] Post Ureteroscopy With or Without Stent Instructions  Definitions:  Ureter: The duct that transports urine from the kidney to the bladder. Stent:   A plastic hollow tube that is placed into the ureter, from the kidney to the bladder to prevent the ureter from swelling shut.  GENERAL INSTRUCTIONS:  Despite the fact that no skin incisions were used, the area around the ureter and bladder is raw and irritated. The stent is a foreign body which will further irritate the bladder wall. This irritation is manifested by increased frequency of urination, both day and night, and by an increase in the urge to urinate. In some, the urge to urinate is present almost always. Sometimes the urge is strong enough that you may not be able to stop yourself from urinating. The only real cure is to remove the stent and then give time for the bladder wall to heal which can't be done until the danger of the ureter swelling shut has passed, which varies.  You may see some blood in your urine while the stent is in place and a few days afterwards. Do not be alarmed, even if the urine was clear for a while. Get off your feet and drink lots of fluids until clearing occurs. If you start to pass clots or don't improve, call us.  DIET: You may return to your normal diet immediately. Because of the raw surface of your bladder, alcohol, spicy foods, acid type foods and drinks with caffeine may cause irritation or frequency and should be used in moderation. To keep your urine flowing freely and to avoid constipation, drink plenty of fluids during the day ( 8-10 glasses ). Tip: Avoid cranberry juice because it is very acidic.  ACTIVITY: Your physical activity doesn't need to be restricted. However, if you are very active, you may see some blood in your urine. We suggest that you reduce your activity under these circumstances until the bleeding has stopped.  BOWELS: It is important to  keep your bowels regular during the postoperative period. Straining with bowel movements can cause bleeding. A bowel movement every other day is reasonable. Use a mild laxative if needed, such as Milk of Magnesia 2-3 tablespoons, or 2 Dulcolax tablets. Call if you continue to have problems. If you have been taking narcotics for pain, before, during or after your surgery, you may be constipated. Take a laxative if necessary.   MEDICATION: You should resume your pre-surgery medications unless told not to. In addition you will often be given an antibiotic to prevent infection and likely several as needed medications for stent related discomfort. These should be taken as prescribed until the bottles are finished unless you are having an unusual reaction to one of the drugs.  PROBLEMS YOU SHOULD REPORT TO [place]: Fevers over 100.5 Fahrenheit. Heavy bleeding, or clots ( See above notes about blood in urine ). Inability to urinate. Drug reactions ( hives, rash, nausea, vomiting, diarrhea ). Severe burning or pain with urination that is not improving.     No acetaminophen/Tylenol until after 12:20 pm today if needed.     Post Anesthesia Home Care Instructions  Activity: Get plenty of rest for the remainder of the day. A responsible individual must stay with you for 24 hours following the procedure.  For the next 24 hours, DO NOT: -Drive a car -Advertising copywriter -Drink alcoholic beverages -Take any medication unless instructed by your physician -Make any legal decisions or sign important papers.  Meals: Start  with liquid foods such as gelatin or soup. Progress to regular foods as tolerated. Avoid greasy, spicy, heavy foods. If nausea and/or vomiting occur, drink only clear liquids until the nausea and/or vomiting subsides. Call your physician if vomiting continues.  Special Instructions/Symptoms: Your throat may feel dry or sore from the anesthesia or the breathing tube placed in your throat  during surgery. If this causes discomfort, gargle with warm salt water. The discomfort should disappear within 24 hours.

## 2022-12-12 ENCOUNTER — Encounter (HOSPITAL_BASED_OUTPATIENT_CLINIC_OR_DEPARTMENT_OTHER): Payer: Self-pay | Admitting: Urology

## 2022-12-13 ENCOUNTER — Encounter (HOSPITAL_BASED_OUTPATIENT_CLINIC_OR_DEPARTMENT_OTHER): Payer: Self-pay | Admitting: Urology

## 2022-12-13 NOTE — Progress Notes (Signed)
Spoke w/ via phone for pre-op interview--- Rachel [name] Lab needs dos---- NONE        Lab results------ COVID test -----patient states asymptomatic no test needed Arrive at -------0730 NPO after MN NO Solid Food.  Clear liquids from MN until---0630 Med rec completed Medications to take morning of surgery -----Flovent, Synthroid,protonix and prozac.  Diabetic medication ----- Patient instructed no nail polish to be worn day of surgery Patient instructed to bring photo id and insurance card day of surgery Patient aware to have Driver (ride ) / caregiver    for 24 hours after surgery - Sister Rachel [name] Patient Special Instructions ----- Pre-Op special Instructions ----- Patient verbalized understanding of instructions that were given at this phone interview. Patient denies chest pain, sob, fever, cough at the interview.

## 2022-12-17 DIAGNOSIS — Z6833 Body mass index (BMI) 33.0-33.9, adult: Secondary | ICD-10-CM | POA: Diagnosis not present

## 2022-12-17 DIAGNOSIS — R102 Pelvic and perineal pain: Secondary | ICD-10-CM | POA: Diagnosis not present

## 2022-12-17 DIAGNOSIS — N83202 Unspecified ovarian cyst, left side: Secondary | ICD-10-CM | POA: Diagnosis not present

## 2022-12-17 DIAGNOSIS — Z01419 Encounter for gynecological examination (general) (routine) without abnormal findings: Secondary | ICD-10-CM | POA: Diagnosis not present

## 2022-12-17 DIAGNOSIS — Z1231 Encounter for screening mammogram for malignant neoplasm of breast: Secondary | ICD-10-CM | POA: Diagnosis not present

## 2022-12-17 DIAGNOSIS — M8580 Other specified disorders of bone density and structure, unspecified site: Secondary | ICD-10-CM | POA: Diagnosis not present

## 2022-12-18 DIAGNOSIS — J4520 Mild intermittent asthma, uncomplicated: Secondary | ICD-10-CM | POA: Diagnosis not present

## 2022-12-18 DIAGNOSIS — F4321 Adjustment disorder with depressed mood: Secondary | ICD-10-CM | POA: Diagnosis not present

## 2022-12-18 DIAGNOSIS — E039 Hypothyroidism, unspecified: Secondary | ICD-10-CM | POA: Diagnosis not present

## 2022-12-18 DIAGNOSIS — G2581 Restless legs syndrome: Secondary | ICD-10-CM | POA: Diagnosis not present

## 2022-12-18 DIAGNOSIS — E6609 Other obesity due to excess calories: Secondary | ICD-10-CM | POA: Diagnosis not present

## 2022-12-18 DIAGNOSIS — F5101 Primary insomnia: Secondary | ICD-10-CM | POA: Diagnosis not present

## 2022-12-18 DIAGNOSIS — F419 Anxiety disorder, unspecified: Secondary | ICD-10-CM | POA: Diagnosis not present

## 2022-12-18 DIAGNOSIS — Z0000 Encounter for general adult medical examination without abnormal findings: Secondary | ICD-10-CM | POA: Diagnosis not present

## 2022-12-18 DIAGNOSIS — Z6834 Body mass index (BMI) 34.0-34.9, adult: Secondary | ICD-10-CM | POA: Diagnosis not present

## 2022-12-18 DIAGNOSIS — I700 Atherosclerosis of aorta: Secondary | ICD-10-CM | POA: Diagnosis not present

## 2022-12-18 DIAGNOSIS — K219 Gastro-esophageal reflux disease without esophagitis: Secondary | ICD-10-CM | POA: Diagnosis not present

## 2022-12-18 DIAGNOSIS — Z1331 Encounter for screening for depression: Secondary | ICD-10-CM | POA: Diagnosis not present

## 2022-12-23 NOTE — Anesthesia Preprocedure Evaluation (Signed)
Anesthesia Evaluation  Patient identified by MRN, date of birth, ID band Patient awake    Reviewed: Allergy & Precautions, NPO status , Patient's Chart, lab work & pertinent test results  History of Anesthesia Complications (+) PONV and history of anesthetic complications  Airway Mallampati: I  TM Distance: >3 FB Neck ROM: Full    Dental  (+) Dental Advisory Given   Pulmonary asthma (last inhaler use >1 year ago) , sleep apnea , Recent URI  (recurrent, none recently)   Pulmonary exam normal breath sounds clear to auscultation       Cardiovascular (-) hypertension(-) angina (-) Past MI, (-) Cardiac Stents and (-) CABG (-) dysrhythmias  Rhythm:Regular Rate:Normal  HLD   Neuro/Psych  Headaches, neg Seizures    GI/Hepatic hiatal hernia,GERD  Medicated,,Fatty liver H/o bariatric surgery   Endo/Other  Hypothyroidism    Renal/GU Renal disease (stones)     Musculoskeletal  (+) Arthritis , Osteoarthritis,    Abdominal  (+) + obese  Peds  Hematology negative hematology ROS (+)   Anesthesia Other Findings   Reproductive/Obstetrics                             Anesthesia Physical Anesthesia Plan  ASA: 2  Anesthesia Plan: General   Post-op Pain Management: Tylenol PO (pre-op)*   Induction: Intravenous  PONV Risk Score and Plan: 4 or greater and Ondansetron, Dexamethasone and Treatment may vary due to age or medical condition  Airway Management Planned: LMA  Additional Equipment:   Intra-op Plan:   Post-operative Plan: Extubation in OR  Informed Consent: I have reviewed the patients History and Physical, chart, labs and discussed the procedure including the risks, benefits and alternatives for the proposed anesthesia with the patient or authorized representative who has indicated his/her understanding and acceptance.     Dental advisory given  Plan Discussed with: CRNA and  Anesthesiologist  Anesthesia Plan Comments: (Risks of general anesthesia discussed including, but not limited to, sore throat, hoarse voice, chipped/damaged teeth, injury to vocal cords, nausea and vomiting, allergic reactions, lung infection, heart attack, stroke, and death. All questions answered. )        Anesthesia Quick Evaluation

## 2022-12-24 ENCOUNTER — Ambulatory Visit (HOSPITAL_BASED_OUTPATIENT_CLINIC_OR_DEPARTMENT_OTHER): Payer: Self-pay | Admitting: Anesthesiology

## 2022-12-24 ENCOUNTER — Encounter (HOSPITAL_BASED_OUTPATIENT_CLINIC_OR_DEPARTMENT_OTHER): Payer: Self-pay | Admitting: Urology

## 2022-12-24 ENCOUNTER — Other Ambulatory Visit: Payer: Self-pay

## 2022-12-24 ENCOUNTER — Encounter (HOSPITAL_BASED_OUTPATIENT_CLINIC_OR_DEPARTMENT_OTHER)
Admission: RE | Disposition: A | Discharge status: Home / Self Care | Payer: Self-pay | Source: Home / Self Care | Attending: Urology

## 2022-12-24 ENCOUNTER — Ambulatory Visit (HOSPITAL_BASED_OUTPATIENT_CLINIC_OR_DEPARTMENT_OTHER)
Admission: RE | Admit: 2022-12-24 | Discharge: 2022-12-24 | Disposition: A | Discharge status: Home / Self Care | Payer: Medicare PPO | Attending: Urology | Admitting: Urology

## 2022-12-24 DIAGNOSIS — E039 Hypothyroidism, unspecified: Secondary | ICD-10-CM | POA: Insufficient documentation

## 2022-12-24 DIAGNOSIS — N202 Calculus of kidney with calculus of ureter: Secondary | ICD-10-CM | POA: Insufficient documentation

## 2022-12-24 DIAGNOSIS — N201 Calculus of ureter: Secondary | ICD-10-CM

## 2022-12-24 DIAGNOSIS — Z9884 Bariatric surgery status: Secondary | ICD-10-CM | POA: Insufficient documentation

## 2022-12-24 DIAGNOSIS — K219 Gastro-esophageal reflux disease without esophagitis: Secondary | ICD-10-CM | POA: Diagnosis not present

## 2022-12-24 DIAGNOSIS — N200 Calculus of kidney: Secondary | ICD-10-CM | POA: Diagnosis not present

## 2022-12-24 DIAGNOSIS — K760 Fatty (change of) liver, not elsewhere classified: Secondary | ICD-10-CM | POA: Insufficient documentation

## 2022-12-24 HISTORY — PX: CYSTOSCOPY/URETEROSCOPY/HOLMIUM LASER/STENT PLACEMENT: SHX6546

## 2022-12-24 SURGERY — CYSTOSCOPY/URETEROSCOPY/HOLMIUM LASER/STENT PLACEMENT
Anesthesia: General | Site: Ureter | Laterality: Left

## 2022-12-24 MED ORDER — FENTANYL CITRATE (PF) 100 MCG/2ML IJ SOLN
INTRAMUSCULAR | Status: DC | PRN
  Administered 2022-12-24: 25 ug via INTRAVENOUS
  Administered 2022-12-24: 50 ug via INTRAVENOUS
  Administered 2022-12-24: 25 ug via INTRAVENOUS

## 2022-12-24 MED ORDER — ONDANSETRON HCL 4 MG/2ML IJ SOLN
INTRAMUSCULAR | Status: DC | PRN
  Administered 2022-12-24: 4 mg via INTRAVENOUS

## 2022-12-24 MED ORDER — LIDOCAINE 2% (20 MG/ML) 5 ML SYRINGE
INTRAMUSCULAR | Status: DC | PRN
  Administered 2022-12-24: 100 mg via INTRAVENOUS

## 2022-12-24 MED ORDER — CEFAZOLIN SODIUM-DEXTROSE 2-4 GM/100ML-% IV SOLN
INTRAVENOUS | Status: AC
  Filled 2022-12-24: qty 100

## 2022-12-24 MED ORDER — FENTANYL CITRATE (PF) 100 MCG/2ML IJ SOLN
INTRAMUSCULAR | Status: AC
  Filled 2022-12-24: qty 2

## 2022-12-24 MED ORDER — OXYCODONE HCL 5 MG/5ML PO SOLN: 5.0000 mg | Freq: Once | ORAL | Status: DC | PRN

## 2022-12-24 MED ORDER — LACTATED RINGERS IV SOLN: INTRAVENOUS | Status: DC

## 2022-12-24 MED ORDER — OXYCODONE HCL 5 MG PO TABS: 5.0000 mg | ORAL_TABLET | Freq: Three times a day (TID) | ORAL | 0 refills | Status: AC | PRN

## 2022-12-24 MED ORDER — CIPROFLOXACIN IN D5W 400 MG/200ML IV SOLN
400.0000 mg | INTRAVENOUS | Status: AC
  Administered 2022-12-24: 400 mg via INTRAVENOUS

## 2022-12-24 MED ORDER — CIPROFLOXACIN IN D5W 400 MG/200ML IV SOLN
INTRAVENOUS | Status: AC
  Filled 2022-12-24: qty 200

## 2022-12-24 MED ORDER — PROPOFOL 10 MG/ML IV BOLUS
INTRAVENOUS | Status: AC
  Filled 2022-12-24: qty 20

## 2022-12-24 MED ORDER — SULFAMETHOXAZOLE-TRIMETHOPRIM 800-160 MG PO TABS: 1.0000 | ORAL_TABLET | Freq: Two times a day (BID) | ORAL | 0 refills | Status: DC

## 2022-12-24 MED ORDER — SODIUM CHLORIDE 0.9 % IV SOLN: INTRAVENOUS | Status: DC

## 2022-12-24 MED ORDER — OXYCODONE HCL 5 MG PO TABS: 5.0000 mg | ORAL_TABLET | Freq: Once | ORAL | Status: DC | PRN

## 2022-12-24 MED ORDER — IOHEXOL 300 MG/ML  SOLN
INTRAMUSCULAR | Status: DC | PRN
  Administered 2022-12-24: 20 mL via URETHRAL

## 2022-12-24 MED ORDER — SODIUM CHLORIDE 0.9 % IR SOLN
Status: DC | PRN
  Administered 2022-12-24: 3000 mL

## 2022-12-24 MED ORDER — HYOSCYAMINE SULFATE 0.125 MG SL SUBL: 0.1250 mg | SUBLINGUAL_TABLET | SUBLINGUAL | 1 refills | Status: DC | PRN

## 2022-12-24 MED ORDER — ACETAMINOPHEN 500 MG PO TABS
1000.0000 mg | ORAL_TABLET | Freq: Once | ORAL | Status: AC
  Administered 2022-12-24: 1000 mg via ORAL

## 2022-12-24 MED ORDER — FENTANYL CITRATE (PF) 100 MCG/2ML IJ SOLN: 25.0000 ug | INTRAMUSCULAR | Status: DC | PRN

## 2022-12-24 MED ORDER — ACETAMINOPHEN 500 MG PO TABS
ORAL_TABLET | ORAL | Status: AC
  Filled 2022-12-24: qty 2

## 2022-12-24 MED ORDER — AMISULPRIDE (ANTIEMETIC) 5 MG/2ML IV SOLN
10.0000 mg | Freq: Once | INTRAVENOUS | Status: DC | PRN
Start: 2022-12-24 — End: 2022-12-24

## 2022-12-24 MED ORDER — STERILE WATER FOR IRRIGATION IR SOLN
Status: DC | PRN
  Administered 2022-12-24: 500 mL

## 2022-12-24 MED ORDER — MIDAZOLAM HCL 2 MG/2ML IJ SOLN
INTRAMUSCULAR | Status: AC
  Filled 2022-12-24: qty 2

## 2022-12-24 MED ORDER — DEXAMETHASONE SODIUM PHOSPHATE 10 MG/ML IJ SOLN
INTRAMUSCULAR | Status: DC | PRN
  Administered 2022-12-24: 10 mg via INTRAVENOUS

## 2022-12-24 MED ORDER — PROPOFOL 10 MG/ML IV BOLUS
INTRAVENOUS | Status: DC | PRN
  Administered 2022-12-24: 160 mg via INTRAVENOUS

## 2022-12-24 MED ORDER — MIDAZOLAM HCL 5 MG/5ML IJ SOLN
INTRAMUSCULAR | Status: DC | PRN
  Administered 2022-12-24: 2 mg via INTRAVENOUS

## 2022-12-24 SURGICAL SUPPLY — 22 items
BAG DRAIN URO-CYSTO SKYTR STRL (DRAIN) ×1 IMPLANT
CATH URETERAL DUAL LUMEN 10F (MISCELLANEOUS) IMPLANT
CLOTH BEACON ORANGE TIMEOUT ST (SAFETY) ×1 IMPLANT
COVER DOME SNAP 22 D (MISCELLANEOUS) ×1 IMPLANT
GLOVE BIO SURGEON STRL SZ7 (GLOVE) ×1 IMPLANT
GLOVE BIOGEL PI IND STRL 6.5 (GLOVE) IMPLANT
GLOVE SURG SS PI 6.5 STRL IVOR (GLOVE) IMPLANT
GOWN STRL REUS W/ TWL LRG LVL3 (GOWN DISPOSABLE) IMPLANT
GOWN STRL REUS W/TWL LRG LVL3 (GOWN DISPOSABLE) ×1 IMPLANT
GUIDEWIRE STR DUAL SENSOR (WIRE) IMPLANT
GUIDEWIRE ZIPWRE .038 STRAIGHT (WIRE) IMPLANT
IV NS IRRIG 3000ML ARTHROMATIC (IV SOLUTION) ×2 IMPLANT
KIT TURNOVER CYSTO (KITS) ×1 IMPLANT
MANIFOLD NEPTUNE II (INSTRUMENTS) ×1 IMPLANT
PACK CYSTO (CUSTOM PROCEDURE TRAY) ×1 IMPLANT
SHEATH NAVIGATOR HD 11/13X28 (SHEATH) IMPLANT
SLEEVE SCD COMPRESS KNEE MED (STOCKING) ×1 IMPLANT
STENT URET 6FRX26 CONTOUR (STENTS) IMPLANT
TRACTIP FLEXIVA PULS ID 200XHI (Laser) IMPLANT
TUBE CONNECTING 12X1/4 (SUCTIONS) IMPLANT
TUBING UROLOGY SET (TUBING) IMPLANT
WATER STERILE IRR 500ML POUR (IV SOLUTION) IMPLANT

## 2022-12-24 NOTE — Anesthesia Procedure Notes (Signed)
Procedure Name: LMA Insertion Date/Time: 12/24/2022 10:06 AM  Performed by: Chantele Corado D, CRNAPre-anesthesia Checklist: Patient identified, Emergency Drugs available, Suction available and Patient being monitored Patient Re-evaluated:Patient Re-evaluated prior to induction Oxygen Delivery Method: Circle system utilized Preoxygenation: Pre-oxygenation with 100% oxygen Induction Type: IV induction Ventilation: Mask ventilation without difficulty LMA: LMA inserted LMA Size: 4.0 Tube type: Oral Number of attempts: 1 Placement Confirmation: positive ETCO2 and breath sounds checked- equal and bilateral Tube secured with: Tape Dental Injury: Teeth and Oropharynx as per pre-operative assessment

## 2022-12-24 NOTE — Discharge Instructions (Addendum)
[facility] [phone] Post Ureteroscopy With or Without Stent Instructions  Remove stent by pulling on string Monday December 9 (if stent is very painful or bothersome, OK to remove Friday morning December 6)  Definitions:  Ureter: The duct that transports urine from the kidney to the bladder. Stent:   A plastic hollow tube that is placed into the ureter, from the kidney to the bladder to prevent the ureter from swelling shut.  GENERAL INSTRUCTIONS:  Despite the fact that no skin incisions were used, the area around the ureter and bladder is raw and irritated. The stent is a foreign body which will further irritate the bladder wall. This irritation is manifested by increased frequency of urination, both day and night, and by an increase in the urge to urinate. In some, the urge to urinate is present almost always. Sometimes the urge is strong enough that you may not be able to stop yourself from urinating. The only real cure is to remove the stent and then give time for the bladder wall to heal which can't be done until the danger of the ureter swelling shut has passed, which varies.  You may see some blood in your urine while the stent is in place and a few days afterwards. Do not be alarmed, even if the urine was clear for a while. Get off your feet and drink lots of fluids until clearing occurs. If you start to pass clots or don't improve, call us.  DIET: You may return to your normal diet immediately. Because of the raw surface of your bladder, alcohol, spicy foods, acid type foods and drinks with caffeine may cause irritation or frequency and should be used in moderation. To keep your urine flowing freely and to avoid constipation, drink plenty of fluids during the day ( 8-10 glasses ). Tip: Avoid cranberry juice because it is very acidic.  ACTIVITY: Your physical activity doesn't need to be restricted. However, if you are very active, you may see some blood in your  urine. We suggest that you reduce your activity under these circumstances until the bleeding has stopped.  BOWELS: It is important to keep your bowels regular during the postoperative period. Straining with bowel movements can cause bleeding. A bowel movement every other day is reasonable. Use a mild laxative if needed, such as Milk of Magnesia 2-3 tablespoons, or 2 Dulcolax tablets. Call if you continue to have problems. If you have been taking narcotics for pain, before, during or after your surgery, you may be constipated. Take a laxative if necessary.   MEDICATION: You should resume your pre-surgery medications unless told not to. In addition you will often be given an antibiotic to prevent infection and likely several as needed medications for stent related discomfort. These should be taken as prescribed until the bottles are finished unless you are having an unusual reaction to one of the drugs.  PROBLEMS YOU SHOULD REPORT TO [place]: Fevers over 100.5 Fahrenheit. Heavy bleeding, or clots ( See above notes about blood in urine ). Inability to urinate. Drug reactions ( hives, rash, nausea, vomiting, diarrhea ). Severe burning or pain with urination that is not improving.    Post Anesthesia Home Care Instructions  Activity: Get plenty of rest for the remainder of the day. A responsible individual must stay with you for 24 hours following the procedure.  For the next 24 hours, DO NOT: -Drive a car -Advertising copywriter -Drink alcoholic beverages -Take any medication unless instructed by your physician -Make  any legal decisions or sign important papers.  Meals: Start with liquid foods such as gelatin or soup. Progress to regular foods as tolerated. Avoid greasy, spicy, heavy foods. If nausea and/or vomiting occur, drink only clear liquids until the nausea and/or vomiting subsides. Call your physician if vomiting continues.  Special Instructions/Symptoms: Your throat may feel dry or sore  from the anesthesia or the breathing tube placed in your throat during surgery. If this causes discomfort, gargle with warm salt water. The discomfort should disappear within 24 hours.

## 2022-12-24 NOTE — Op Note (Addendum)
Preoperative diagnosis: left ureteral calculus  Postoperative diagnosis: left ureteral calculus  Procedure:  Cystoscopy left ureteroscopy and stone removal Ureteroscopic laser lithotripsy left 69F x 26 ureteral stent placement  left retrograde pyelography with interpretation  Surgeon: Dr. Irine Seal Assistant: Dr. Zettie Pho  Anesthesia: General  Complications: None  Intraoperative findings:  -Cystourethroscopy normal, existing stent in place with minimal encrustation -Ureteroscopy demonstrating very mild stone burden left, remainder of stone burden dusted with holmium laser -6 French x 26 cm uncomplicated stent placement with string  EBL: Minimal  Specimens: None  Indication: Rachel [name] is a 67 y.o.   patient with a history of left nephrolithiasis status post USE now presenting for second look left USE. After reviewing the management options for treatment, the patient elected to proceed with the above surgical procedure(s). We have discussed the potential benefits and risks of the procedure, side effects of the proposed treatment, the likelihood of the patient achieving the goals of the procedure, and any potential problems that might occur during the procedure or recuperation. Informed consent has been obtained.   Description of procedure:  The patient was taken to the operating room and general anesthesia was induced.  The patient was placed in the dorsal lithotomy position, prepped and draped in the usual sterile fashion, and preoperative antibiotics were administered. A preoperative time-out was performed.   Cystourethroscopy was performed.  The above findings were noted.  The left existing stent was grasped and pulled to the urethral meatus.  This stent was cannulated with a Glidewire, which was placed up to the renal pelvis.  Confirmation was performed via fluorscopy.  The existing stent was removed with minimal difficulty.  We then placed a dual-lumen catheter up  to the distal ureter over a Glidewire.  We shot a retrograde pyelogram through the second channel, and then placed a sensor wire.  The dual-lumen catheter was removed.  Using our Glidewire as a working wire, we advanced an 11/13 ureteral sheath over our sensor wire up to the renal pelvis with fluoroscopic guidance.  This was placed without difficulty.  The inner sheath and the sensor wire was removed.  We then performed ureteroscopy which demonstrated the above findings.  Using a holmium laser, we dusted the remaining stone fragments, which were largely present in the interpolar calyces.  We then performed a retrograde pyelogram and examined each calyx, which showed no remaining stone fragments.  We performed a visual inspection of the ureter as we removed both the ureteroscope and our ureteral sheath.  Using the Glidewire, we advanced a 6 [place] by 26 cm ureteral stent with string up to the renal pelvis.  The Glidewire was removed, and fluoroscopy demonstrated appropriate curls of the stent both proximally and distally.  Our case was ended, and the patient was awoken and transported to PACU in stable condition.  The patient will be discharged home and will complete a short course of antibiotics.  They will return for follow-up in our clinic.  Dr. Irine Seal was present for the entirety of the procedure  [name], MD Resident Physician [facility]

## 2022-12-24 NOTE — Transfer of Care (Signed)
Immediate Anesthesia Transfer of Care Note  Patient: Rachel [name]  Procedure(s) Performed: SECOND STAGE LEFT  URETEROSCOPY/RETROGRADE PYELOGRAM/HOLMIUM LASER/LEFT STENT PLACEMENT (Left: Ureter)  Patient Location: PACU  Anesthesia Type:General  Level of Consciousness: awake, alert , and oriented  Airway & Oxygen Therapy: Patient Spontanous Breathing and Patient connected to nasal cannula oxygen  Post-op Assessment: Report given to RN and Post -op Vital signs reviewed and stable  Post vital signs: Reviewed and stable  Last Vitals:  Vitals Value Taken Time  BP 134/72 12/24/22 1054  Temp    Pulse 63 12/24/22 1056  Resp 14 12/24/22 1056  SpO2 99 % 12/24/22 1056  Vitals shown include unfiled device data.  Last Pain:  Vitals:   12/24/22 0747  TempSrc: Oral  PainSc: 2       Patients Stated Pain Goal: 5 (12/24/22 0747)  Complications: No notable events documented.

## 2022-12-24 NOTE — Anesthesia Postprocedure Evaluation (Signed)
Anesthesia Post Note  Patient: Rachel [name]  Procedure(s) Performed: SECOND STAGE LEFT  URETEROSCOPY/RETROGRADE PYELOGRAM/HOLMIUM LASER/LEFT STENT EXCHANGED (Left: Ureter)     Patient location during evaluation: PACU Anesthesia Type: General Level of consciousness: awake Pain management: pain level controlled Vital Signs Assessment: post-procedure vital signs reviewed and stable Respiratory status: spontaneous breathing, nonlabored ventilation and respiratory function stable Cardiovascular status: blood pressure returned to baseline and stable Postop Assessment: no apparent nausea or vomiting Anesthetic complications: no   No notable events documented.  Last Vitals:  Vitals:   12/24/22 1115 12/24/22 1126  BP: 120/68 118/74  Pulse: (!) 53 64  Resp: 14 14  Temp: (!) 36.3 C 36.5 C  SpO2: 96% 99%    Last Pain:  Vitals:   12/24/22 1126  TempSrc:   PainSc: 0-No pain                 Linton Rump

## 2022-12-24 NOTE — Interval H&P Note (Signed)
History and Physical Interval Note:  12/24/2022 9:51 AM  Rachel [name]  has presented today for surgery, with the diagnosis of LEFT URETERAL AND RENAL STONES.  The various methods of treatment have been discussed with the patient and family. After consideration of risks, benefits and other options for treatment, the patient has consented to  Procedure(s) with comments: SECOND STAGE LEFT  URETEROSCOPY/HOLMIUM LASER/LEFT STENT PLACEMENT (Left) - 90 MINUTES NEEDED FOR CASE as a surgical intervention.  The patient's history has been reviewed, patient examined, no change in status, stable for surgery.  I have reviewed the patient's chart and labs.  Questions were answered to the patient's satisfaction.     Lowell Makara L Anjelika Ausburn

## 2022-12-26 ENCOUNTER — Encounter (HOSPITAL_BASED_OUTPATIENT_CLINIC_OR_DEPARTMENT_OTHER): Payer: Self-pay | Admitting: Urology

## 2023-01-28 DIAGNOSIS — R1032 Left lower quadrant pain: Secondary | ICD-10-CM | POA: Diagnosis not present

## 2023-02-03 DIAGNOSIS — N202 Calculus of kidney with calculus of ureter: Secondary | ICD-10-CM | POA: Diagnosis not present

## 2023-02-26 DIAGNOSIS — Z6834 Body mass index (BMI) 34.0-34.9, adult: Secondary | ICD-10-CM | POA: Diagnosis not present

## 2023-02-26 DIAGNOSIS — J0100 Acute maxillary sinusitis, unspecified: Secondary | ICD-10-CM | POA: Diagnosis not present

## 2023-03-21 DIAGNOSIS — M545 Low back pain, unspecified: Secondary | ICD-10-CM | POA: Diagnosis not present

## 2023-03-21 DIAGNOSIS — M25512 Pain in left shoulder: Secondary | ICD-10-CM | POA: Diagnosis not present

## 2023-03-26 DIAGNOSIS — Z6835 Body mass index (BMI) 35.0-35.9, adult: Secondary | ICD-10-CM | POA: Diagnosis not present

## 2023-03-26 DIAGNOSIS — G43809 Other migraine, not intractable, without status migrainosus: Secondary | ICD-10-CM | POA: Diagnosis not present

## 2023-03-26 DIAGNOSIS — H6992 Unspecified Eustachian tube disorder, left ear: Secondary | ICD-10-CM | POA: Diagnosis not present

## 2023-04-10 DIAGNOSIS — Z6834 Body mass index (BMI) 34.0-34.9, adult: Secondary | ICD-10-CM | POA: Diagnosis not present

## 2023-04-10 DIAGNOSIS — Z9884 Bariatric surgery status: Secondary | ICD-10-CM | POA: Diagnosis not present

## 2023-04-10 DIAGNOSIS — M5442 Lumbago with sciatica, left side: Secondary | ICD-10-CM | POA: Diagnosis not present

## 2023-05-08 DIAGNOSIS — N202 Calculus of kidney with calculus of ureter: Secondary | ICD-10-CM | POA: Diagnosis not present

## 2023-05-08 DIAGNOSIS — Z87442 Personal history of urinary calculi: Secondary | ICD-10-CM | POA: Diagnosis not present

## 2023-05-15 DIAGNOSIS — R29898 Other symptoms and signs involving the musculoskeletal system: Secondary | ICD-10-CM | POA: Diagnosis not present

## 2023-05-15 DIAGNOSIS — M5416 Radiculopathy, lumbar region: Secondary | ICD-10-CM | POA: Diagnosis not present

## 2023-05-15 DIAGNOSIS — G959 Disease of spinal cord, unspecified: Secondary | ICD-10-CM | POA: Diagnosis not present

## 2023-05-26 DIAGNOSIS — M5432 Sciatica, left side: Secondary | ICD-10-CM | POA: Diagnosis not present

## 2023-05-26 DIAGNOSIS — R2681 Unsteadiness on feet: Secondary | ICD-10-CM | POA: Diagnosis not present

## 2023-05-26 DIAGNOSIS — M545 Low back pain, unspecified: Secondary | ICD-10-CM | POA: Diagnosis not present

## 2023-06-02 DIAGNOSIS — M545 Low back pain, unspecified: Secondary | ICD-10-CM | POA: Diagnosis not present

## 2023-06-02 DIAGNOSIS — R2681 Unsteadiness on feet: Secondary | ICD-10-CM | POA: Diagnosis not present

## 2023-06-02 DIAGNOSIS — M5432 Sciatica, left side: Secondary | ICD-10-CM | POA: Diagnosis not present

## 2023-06-03 DIAGNOSIS — M4312 Spondylolisthesis, cervical region: Secondary | ICD-10-CM | POA: Diagnosis not present

## 2023-06-03 DIAGNOSIS — M50322 Other cervical disc degeneration at C5-C6 level: Secondary | ICD-10-CM | POA: Diagnosis not present

## 2023-06-03 DIAGNOSIS — M47817 Spondylosis without myelopathy or radiculopathy, lumbosacral region: Secondary | ICD-10-CM | POA: Diagnosis not present

## 2023-06-03 DIAGNOSIS — M47812 Spondylosis without myelopathy or radiculopathy, cervical region: Secondary | ICD-10-CM | POA: Diagnosis not present

## 2023-06-03 DIAGNOSIS — M4807 Spinal stenosis, lumbosacral region: Secondary | ICD-10-CM | POA: Diagnosis not present

## 2023-06-03 DIAGNOSIS — M5126 Other intervertebral disc displacement, lumbar region: Secondary | ICD-10-CM | POA: Diagnosis not present

## 2023-06-09 DIAGNOSIS — H5213 Myopia, bilateral: Secondary | ICD-10-CM | POA: Diagnosis not present

## 2023-06-09 DIAGNOSIS — H04123 Dry eye syndrome of bilateral lacrimal glands: Secondary | ICD-10-CM | POA: Diagnosis not present

## 2023-06-09 DIAGNOSIS — H2513 Age-related nuclear cataract, bilateral: Secondary | ICD-10-CM | POA: Diagnosis not present

## 2023-06-12 DIAGNOSIS — M5432 Sciatica, left side: Secondary | ICD-10-CM | POA: Diagnosis not present

## 2023-06-12 DIAGNOSIS — R2681 Unsteadiness on feet: Secondary | ICD-10-CM | POA: Diagnosis not present

## 2023-06-12 DIAGNOSIS — M545 Low back pain, unspecified: Secondary | ICD-10-CM | POA: Diagnosis not present

## 2023-06-23 DIAGNOSIS — R1032 Left lower quadrant pain: Secondary | ICD-10-CM | POA: Diagnosis not present

## 2023-06-23 DIAGNOSIS — R197 Diarrhea, unspecified: Secondary | ICD-10-CM | POA: Diagnosis not present

## 2023-06-27 ENCOUNTER — Emergency Department (HOSPITAL_COMMUNITY)

## 2023-06-27 ENCOUNTER — Other Ambulatory Visit: Payer: Self-pay

## 2023-06-27 ENCOUNTER — Emergency Department (HOSPITAL_COMMUNITY)
Admission: EM | Admit: 2023-06-27 | Discharge: 2023-06-28 | Disposition: A | Discharge status: Home / Self Care | Attending: Emergency Medicine | Admitting: Emergency Medicine

## 2023-06-27 ENCOUNTER — Encounter (HOSPITAL_COMMUNITY): Payer: Self-pay

## 2023-06-27 DIAGNOSIS — H532 Diplopia: Secondary | ICD-10-CM | POA: Diagnosis not present

## 2023-06-27 DIAGNOSIS — R109 Unspecified abdominal pain: Secondary | ICD-10-CM | POA: Diagnosis not present

## 2023-06-27 DIAGNOSIS — I671 Cerebral aneurysm, nonruptured: Secondary | ICD-10-CM | POA: Diagnosis not present

## 2023-06-27 DIAGNOSIS — E041 Nontoxic single thyroid nodule: Secondary | ICD-10-CM | POA: Diagnosis not present

## 2023-06-27 DIAGNOSIS — K5792 Diverticulitis of intestine, part unspecified, without perforation or abscess without bleeding: Secondary | ICD-10-CM

## 2023-06-27 DIAGNOSIS — K5732 Diverticulitis of large intestine without perforation or abscess without bleeding: Secondary | ICD-10-CM | POA: Insufficient documentation

## 2023-06-27 DIAGNOSIS — G459 Transient cerebral ischemic attack, unspecified: Secondary | ICD-10-CM | POA: Diagnosis not present

## 2023-06-27 DIAGNOSIS — I720 Aneurysm of carotid artery: Secondary | ICD-10-CM | POA: Diagnosis not present

## 2023-06-27 DIAGNOSIS — R079 Chest pain, unspecified: Secondary | ICD-10-CM | POA: Diagnosis not present

## 2023-06-27 DIAGNOSIS — I729 Aneurysm of unspecified site: Secondary | ICD-10-CM

## 2023-06-27 LAB — COMPREHENSIVE METABOLIC PANEL WITH GFR
ALT: 23 U/L (ref 0–44)
AST: 30 U/L (ref 15–41)
Albumin: 3.6 g/dL (ref 3.5–5.0)
Alkaline Phosphatase: 86 U/L (ref 38–126)
Anion gap: 9 (ref 5–15)
BUN: 14 mg/dL (ref 8–23)
CO2: 28 mmol/L (ref 22–32)
Calcium: 9.6 mg/dL (ref 8.9–10.3)
Chloride: 101 mmol/L (ref 98–111)
Creatinine, Ser: 0.77 mg/dL (ref 0.44–1.00)
GFR, Estimated: >60 mL/min (ref >60)
Glucose, Bld: 92 mg/dL (ref 70–99)
Potassium: 3.9 mmol/L (ref 3.5–5.1)
Sodium: 138 mmol/L (ref 135–145)
Total Bilirubin: 0.6 mg/dL (ref 0.0–1.2)
Total Protein: 7.3 g/dL (ref 6.5–8.1)

## 2023-06-27 LAB — CBC
HCT: 42.4 % (ref 36.0–46.0)
Hemoglobin: 13.4 g/dL (ref 12.0–15.0)
MCH: 28.1 pg (ref 26.0–34.0)
MCHC: 31.6 g/dL (ref 30.0–36.0)
MCV: 88.9 fL (ref 80.0–100.0)
Platelets: 244 10*3/uL (ref 150–400)
RBC: 4.77 MIL/uL (ref 3.87–5.11)
RDW: 14.3 % (ref 11.5–15.5)
WBC: 6.8 10*3/uL (ref 4.0–10.5)
nRBC: 0 % (ref 0.0–0.2)

## 2023-06-27 LAB — TROPONIN I (HIGH SENSITIVITY)
Troponin I (High Sensitivity): 2 ng/L (ref <18)
Troponin I (High Sensitivity): 3 ng/L (ref <18)

## 2023-06-27 LAB — LIPASE, BLOOD: Lipase: 49 U/L (ref 11–51)

## 2023-06-27 MED ORDER — LORAZEPAM 0.5 MG PO TABS
0.5000 mg | ORAL_TABLET | Freq: Once | ORAL | Status: AC
  Administered 2023-06-27: 0.5 mg via ORAL
  Filled 2023-06-27: qty 1

## 2023-06-27 MED ORDER — IOHEXOL 350 MG/ML SOLN
75.0000 mL | Freq: Once | INTRAVENOUS | Status: AC | PRN
  Administered 2023-06-27: 75 mL via INTRAVENOUS

## 2023-06-27 NOTE — ED Provider Notes (Signed)
 [facility] Emergency Department Provider Note MRN:  [id number]  Arrival date & time: 06/28/23     Chief Complaint   Chest Pain   History of Present Illness   Rachel [name] is a 68 y.o. year-old female presents to the ED with chief complaint of double vision with associated chest pain.  She states that the double vision has been going on for 3 days.  She states that the chest pain started earlier today.  She denies ever having had double vision before.  States that she thought the symptoms would go away, but they never did.    States that the chest pain started today and felt like heart burn.  States that the pain would come and go.  She states that the chest pain is very mild now.  She denies any SOB.    She denies any new numbness, weakness, or tingling.   She states that she has been having diarrhea for the past several weeks and feels very fatigued form that.  Has history of diverticulitis.  States that she was seen by her regular doctor and started on Augmentin for suspected diverticulitis.  It was after starting the antibiotic that she developed the double vision. History provided by patient.   Review of Systems  Pertinent positive and negative review of systems noted in HPI.    Physical Exam   Vitals:   06/28/23 0000 06/28/23 0156  BP: 120/67 116/66  Pulse: 62 66  Resp: 16 13  Temp: 98.6 F (37 C) 98.4 F (36.9 C)  SpO2: 99% 99%    CONSTITUTIONAL:  non toxic-appearing, NAD NEURO:  Alert and oriented x 3, CN 3-12 grossly intact, normal finger to nose, normal heel to shin, normal strength EYES:  eyes equal and reactive ENT/NECK:  Supple, no stridor  CARDIO:  normal rate, regular rhythm, appears well-perfused  PULM:  No respiratory distress, CTAB GI/GU:  non-distended, no focal tenderness MSK/SPINE:  No gross deformities, no edema, moves all extremities  SKIN:  no rash, atraumatic   *Additional and/or pertinent findings included in MDM  below  Diagnostic and Interventional Summary    EKG Interpretation Date/Time:  Friday June 27 2023 15:44:26 EDT Ventricular Rate:  71 PR Interval:  153 QRS Duration:  88 QT Interval:  382 QTC Calculation: 416 R Axis:   50  Text Interpretation: Sinus rhythm Low voltage, precordial leads No significant change was found Confirmed by Townsend Freud [phone]) on 06/28/2023 1:31:18 AM       Labs Reviewed  CBC  LIPASE, BLOOD  COMPREHENSIVE METABOLIC PANEL WITH GFR  TROPONIN I (HIGH SENSITIVITY)  TROPONIN I (HIGH SENSITIVITY)    CT ANGIO HEAD NECK W WO CM  Final Result    CT ABDOMEN PELVIS W CONTRAST  Final Result    MR BRAIN WO CONTRAST  Final Result    DG Chest 2 View  Final Result      Medications  LORazepam  (ATIVAN ) tablet 0.5 mg (0.5 mg Oral Given 06/27/23 1746)  iohexol  (OMNIPAQUE ) 350 MG/ML injection 75 mL (75 mLs Intravenous Contrast Given 06/27/23 2329)     Procedures  /  Critical Care Procedures  ED Course and Medical Decision Making  I have reviewed the triage vital signs, the nursing notes, and pertinent available records from the EMR.  Social Determinants Affecting Complexity of Care: Patient has no clinically significant social determinants affecting this chief complaint..   ED Course:    Medical Decision Making Patient here with diplopia  for the past 2-3 days. She has horizontal diplopia.  I don't see any evidence of a palsy.  Her EOMs are equal and symmetrical.    Labs are reassuring.    MRI brain ordered in triage is normal.  CTA notable for small ICA aneursym.  I discussed these findings with the patient and she will follow-up.   CT abd/pel consistent with diverticulitis, but patient is currently on Augmentin prescribed by her PCP.  She states that she feels like this is improving.  I don't think that she needs to be admitted for the diverticulitis.  Neurology doesn't think patient needs admission for the diplopia and can follow-up outpatient.  Will  discharge home.  Patient understands and agrees with the plan.  Amount and/or Complexity of Data Reviewed Labs: ordered. Radiology: ordered.         Consultants: I consulted with Dr. Murvin Arthurs, who recommends outpatient follow-up with neurology and neurointerventional radiology..   Treatment and Plan: I considered admission due to patient's initial presentation, but after considering the examination and diagnostic results, patient will not require admission and can be discharged with outpatient follow-up.    Final Clinical Impressions(s) / ED Diagnoses     ICD-10-CM   1. Diplopia  H53.2 Ambulatory referral to Neurology    2. Diverticulitis  K57.92     3. Aneurysm (HCC)  I72.9       ED Discharge Orders          Ordered    Ambulatory referral to Neurology       Comments: An appointment is requested in approximately: 2 weeks   06/28/23 0100              Discharge Instructions Discussed with and Provided to Patient:   Discharge Instructions   None      Sherel Dikes, PA-C 06/28/23 0548    Lindle Rhea, MD 06/28/23 0800

## 2023-06-27 NOTE — ED Provider Triage Note (Signed)
 Emergency Medicine Provider Triage Evaluation Note  Rachel [name] , a 68 y.o. female  was evaluated in triage.  Pt complains of LLQ abdominal pain, nausea, vomiting, dehydration, double vision, chest pain. Patient states she has been experiencing double vision x 3 days. Also feels like she is dehydrated. No history of stroke.   Review of Systems  Positive: Abdominal pain, double vision, chest pain, nausea, vomiting Negative: Fever, chills, shortness of breath  Physical Exam  BP 113/80 (BP Location: Right Arm)   Pulse 72   Temp 98 F (36.7 C) (Oral)   Resp 18   Ht 5\' 6"  (1.676 m)   Wt 95.3 kg   LMP 06/08/2009   SpO2 98%   BMI 33.89 kg/m  Gen:   Awake, no distress  Resp:  Normal effort  MSK:   Moves extremities without difficulty  Other:  Patient complaining of double vision, chest nontender to palpation, LLQ mildly tender, heart and lungs sound normal, no weakness.   Medical Decision Making  Medically screening exam initiated at 4:34 PM.  Appropriate orders placed.  Milton G Grosch was informed that the remainder of the evaluation will be completed by another provider, this initial triage assessment does not replace that evaluation, and the importance of remaining in the ED until their evaluation is complete.  Orders consulted with attending who recommended EKG, CT abdomen pelvis, CT angio head/neck, cxr, MR brain, CBC, CMP, lipase, troponin   Fonda Hymen, PA-C 06/27/23 1653

## 2023-06-27 NOTE — ED Triage Notes (Signed)
 Patient said for 3 weeks she has had diverticulitis with explosive diarrhea for 1 week. Then began vomiting. Began having double vision for 3 days. Has centralized chest pain for 2 days. Denies radiation.

## 2023-06-30 ENCOUNTER — Encounter: Payer: Self-pay | Admitting: Neurology

## 2023-06-30 DIAGNOSIS — F5101 Primary insomnia: Secondary | ICD-10-CM | POA: Diagnosis not present

## 2023-06-30 DIAGNOSIS — E66811 Obesity, class 1: Secondary | ICD-10-CM | POA: Diagnosis not present

## 2023-06-30 DIAGNOSIS — F419 Anxiety disorder, unspecified: Secondary | ICD-10-CM | POA: Diagnosis not present

## 2023-06-30 DIAGNOSIS — H532 Diplopia: Secondary | ICD-10-CM | POA: Diagnosis not present

## 2023-06-30 DIAGNOSIS — I671 Cerebral aneurysm, nonruptured: Secondary | ICD-10-CM | POA: Diagnosis not present

## 2023-06-30 DIAGNOSIS — K5792 Diverticulitis of intestine, part unspecified, without perforation or abscess without bleeding: Secondary | ICD-10-CM | POA: Diagnosis not present

## 2023-06-30 DIAGNOSIS — I700 Atherosclerosis of aorta: Secondary | ICD-10-CM | POA: Diagnosis not present

## 2023-06-30 DIAGNOSIS — E039 Hypothyroidism, unspecified: Secondary | ICD-10-CM | POA: Diagnosis not present

## 2023-07-01 ENCOUNTER — Other Ambulatory Visit (HOSPITAL_COMMUNITY): Payer: Self-pay | Admitting: Interventional Radiology

## 2023-07-01 DIAGNOSIS — I671 Cerebral aneurysm, nonruptured: Secondary | ICD-10-CM

## 2023-07-03 ENCOUNTER — Ambulatory Visit (HOSPITAL_COMMUNITY)
Admission: RE | Admit: 2023-07-03 | Discharge: 2023-07-03 | Disposition: A | Discharge status: Home / Self Care | Source: Ambulatory Visit | Attending: Interventional Radiology | Admitting: Interventional Radiology

## 2023-07-03 DIAGNOSIS — I671 Cerebral aneurysm, nonruptured: Secondary | ICD-10-CM

## 2023-07-04 HISTORY — PX: IR RADIOLOGIST EVAL & MGMT: IMG5224

## 2023-07-08 ENCOUNTER — Other Ambulatory Visit (HOSPITAL_COMMUNITY): Payer: Self-pay | Admitting: Interventional Radiology

## 2023-07-08 DIAGNOSIS — I671 Cerebral aneurysm, nonruptured: Secondary | ICD-10-CM

## 2023-07-08 NOTE — Progress Notes (Signed)
 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  Ischemic sixth nerve palsy is also possible.  Does not appear to be a neuromuscular junction disorder. Left supraclinoid ICA aneurysm   She is undergoing cerebral angiogram and treatment of the aneurysm on 7/7.  Will see if symptoms resolve afterwards. Recommend following up with her ophthalmologist for a more thorough exam. Follow up after procedure/6 months.   Subjective:  Rachel [name] is a 68 year old left-handed female with osteoarthritis, hypothyroidism, GERD, mild asthma and history of kidney stones and bariatric surgery who presents for diplopia.  History supplemented by ED note.  MRI and CTA personally reviewed.  On 06/24/2023, she woke up in the morning with horizontal double vision.  If she would look at something in the distance, she would see 2 of the object side by side.  She also had a dull left sided headache.   It occurred after starting Augmentin for suspected diverticulitis presenting as diarrhea.  No hgait instability or unilateral numbness or weakness.  She has no prior history of double vision but has a baseline left ptosis. On 06/27/2023, she began experiencing chest pain.  As she had chest pain and continued to have double vision, she went to the ED.  CTA head and neck showed 3 x 2 mm supraclinoid left ICA aneurysm but no LVO or high-grade stenosis.  MRI of brain without contrast showed mild chronic small vessel ischemic changes but no acute stroke.  She followed up with endovascular radiology on 07/03/2023 and she has been started on aspirin  and Plavix in preparation for a cerebral angiogram scheduled for 7/7.  Since the ED visit,  she still gets double vision but improved.  It is not constant.  It comes and goes.  Occurs spontaneously and not fatigable.  She has refrained from driving at night because she doesn't feel her night vision is poorer.  She still has the dull left sided headache and the left occipital region is tender to touch.  She uses a walker for several years due to back issues and sciatica.  She feels her balance is worse.  Does not have diabetes.     PAST MEDICAL HISTORY: Past Medical History:  Diagnosis Date   Allergic rhinitis    GERD (gastroesophageal reflux disease)    H/O bariatric surgery 10/2016   History of hiatal hernia    History of kidney stones    Hypothyroidism    Migraines    Mild asthma    OA (osteoarthritis)    PONV (postoperative nausea and vomiting)    Recurrent upper respiratory infection (URI)    Renal calculus, bilateral    Wears glasses     PAST SURGICAL HISTORY: Past Surgical History:  Procedure Laterality Date   ANKLE ARTHROSCOPY Left 01-27-2002    dr Claud Crumb  [facility]   BREAST SURGERY     lumpectomy x 2 - benign    CARPAL TUNNEL RELEASE Bilateral right 08-23-2003   dr sypher [facility]/  left 2006 approx.   CYSTOSCOPY WITH RETROGRADE PYELOGRAM, URETEROSCOPY AND STENT PLACEMENT Bilateral 01/20/2017   Procedure: CYSTOSCOPY WITH RETROGRADE PYELOGRAM, URETEROSCOPY AND STENT PLACEMENT;  Surgeon: Marco Severs, MD;  Location: [facility];  Service: Urology;  Laterality: Bilateral;   CYSTOSCOPY/URETEROSCOPY/HOLMIUM LASER/STENT PLACEMENT Left 12/10/2022   Procedure: FIRST STAGE LEFT URETEROSCOPY/HOLMIUM LASER/RETROGRADE PYELOGRAM/STENT PLACEMENT;  Surgeon: Mallie Seal, MD;  Location: [facility];  Service: Urology;  Laterality: Left;  90 MINUTES NEEDED FOR CASE   CYSTOSCOPY/URETEROSCOPY/HOLMIUM LASER/STENT PLACEMENT Left 12/24/2022   Procedure: SECOND STAGE LEFT  URETEROSCOPY/RETROGRADE PYELOGRAM/HOLMIUM LASER/LEFT STENT EXCHANGED;  Surgeon:  Mallie Seal, MD;  Location: [facility];  Service: Urology;  Laterality: Left;  90 MINUTES NEEDED FOR CASE   DILATION AND CURETTAGE OF UTERUS     several    ESOPHAGOGASTRODUODENOSCOPY  09/2016   HOLMIUM LASER APPLICATION Bilateral 01/20/2017   Procedure: HOLMIUM LASER APPLICATION;  Surgeon: Marco Severs, MD;  Location: [facility];  Service: Urology;  Laterality: Bilateral;   IR RADIOLOGIST EVAL & MGMT  07/04/2023   KNEE ARTHROSCOPY WITH MEDIAL MENISECTOMY Right 06/08/2013   Procedure: RIGHT KNEE ARTHROSCOPY WITH LATERAL MENISECTOMY with ABRASION CHRONDROPLASTY and SUPRAPATELLAR SYNOVECTOMY;  Surgeon: Florencia Hunter, MD;  Location: [facility];  Service: Orthopedics;  Laterality: Right;   LAPAROSCOPIC CHOLECYSTECTOMY  1996   LAPAROSCOPIC GASTRIC RESTRICTIVE DUODENAL PROCEDURE (DUODENAL SWITCH)  11-11-2016     [facility] in [place] , [place]   AND HIATAL HERNIA REPAIR   TONSILLECTOMY  child   TOTAL HIP ARTHROPLASTY Left 04-02-2006   dr Dante Dyer   [facility]   TOTAL KNEE ARTHROPLASTY Right 01/25/2015   Procedure: TOTAL KNEE ARTHROPLASTY;  Surgeon: Hazle Lites, MD;  Location: [facility];  Service: Orthopedics;  Laterality: Right;    MEDICATIONS: Current Outpatient Medications on File Prior to Visit  Medication Sig Dispense Refill   acetaminophen  (TYLENOL ) 500 MG tablet Take 1,000 mg by mouth every 6 (six) hours as needed for mild pain.     albuterol  (PROVENTIL ) (2.5 MG/3ML) 0.083% nebulizer solution Inhale 3 mLs into the lungs every 6 (six) hours as needed for shortness of breath.     aspirin  EC 81 MG tablet Take 81 mg by mouth daily. Swallow whole.     atorvastatin (LIPITOR) 10 MG tablet Take 10 mg by mouth daily.     calcium  citrate-vitamin D  500-500 MG-UNIT chewable tablet Chew 2 tablets by mouth daily.     docusate sodium (COLACE) 100 MG capsule Take 100 mg by mouth 2 (two) times daily.     EPINEPHrine  0.3 mg/0.3 mL IJ SOAJ injection Inject 0.3 mg into the muscle as  needed for anaphylaxis.      ferrous sulfate  324 MG TBEC Take 324 mg by mouth daily with breakfast.     fexofenadine (ALLEGRA) 180 MG tablet Take 180 mg by mouth daily.     FLOVENT  HFA 110 MCG/ACT inhaler Inhale 1 puff into the lungs 2 (two) times daily.     FLUoxetine (PROZAC) 20 MG capsule 1 capsule     fluticasone  (FLONASE ) 50 MCG/ACT nasal spray Place 2 sprays into both nostrils at bedtime.      gabapentin  (NEURONTIN ) 300 MG capsule Take 1,200 mg by mouth 3 (three) times daily.     hyoscyamine  (LEVSIN /SL) 0.125 MG SL tablet Place 1 tablet (0.125 mg total) under the tongue every 4 (four) hours as needed. 20 tablet 1   levothyroxine  (SYNTHROID , LEVOTHROID) 137 MCG tablet Take 50 mcg by mouth daily before breakfast.     Multiple Vitamin (MULTIVITAMIN) tablet Take 1 tablet by mouth daily. Celebrity nuitriional supplement multiple vitaman complete chews     pantoprazole  (PROTONIX ) 40 MG tablet Take 40 mg  by mouth daily.     sulfamethoxazole -trimethoprim  (BACTRIM  DS) 800-160 MG tablet Take 1 tablet by mouth 2 (two) times daily. 10 tablet 0   traZODone  (DESYREL ) 50 MG tablet Take 50 mg by mouth at bedtime. 1-2 tabs at bedtime     No current facility-administered medications on file prior to visit.    ALLERGIES: Allergies  Allergen Reactions   Bee Venom Anaphylaxis   Codeine Itching   Guaifenesin & Derivatives Anaphylaxis   Molds & Smuts Shortness Of Breath    Nose will burn   Pollen Extract     Other reaction(s): Eye Irritation Itchy Eyes, Burning Eyes, Sneezing    FAMILY HISTORY: Family History  Problem Relation Age of Onset   Asthma Mother    Asthma Sister    Urticaria Neg Hx    Eczema Neg Hx    Allergic rhinitis Neg Hx     Objective:  Blood pressure 103/63, pulse 67, resp. rate 20, height 5' 5 (1.651 m), weight 215 lb (97.5 kg), last menstrual period 06/08/2009, SpO2 98%. General: No acute distress.  Patient appears well-groomed.   Head:  Normocephalic/atraumatic Eyes:   fundi examined but not visualized Neck: supple, no paraspinal tenderness, full range of motion Back:  paraspinal tenderness Heart: regular rate and rhythm Neurological Exam: Mental status: alert and oriented to person, place, and time, speech fluent and not dysarthric, language intact. Cranial nerves: CN I: not tested CN II: pupils equal, round and reactive to light, visual fields intact CN III, IV, VI:  full range of motion, no nystagmus, left ptosis CN V: facial sensation intact. CN VII: upper and lower face symmetric CN VIII: hearing intact CN IX, X: gag intact, uvula midline CN XI: sternocleidomastoid and trapezius muscles intact CN XII: tongue midline Bulk & Tone: normal, no fasciculations. Motor:  muscle strength 5/5 throughout Sensation:  Pinprick and vibratory sensation intact. Deep Tendon Reflexes:  2+ throughout,  toes downgoing.   Finger to nose testing:  Without dysmetria.   Gait:  Normal station and stride.  Romberg negative.    Thank you for allowing me to take part in the care of this patient.  Janne Members, DO  CC: Perley Bradley, MD

## 2023-07-09 ENCOUNTER — Encounter (HOSPITAL_COMMUNITY): Payer: Self-pay

## 2023-07-09 ENCOUNTER — Encounter: Payer: Self-pay | Admitting: Neurology

## 2023-07-09 ENCOUNTER — Other Ambulatory Visit: Payer: Self-pay | Admitting: Student

## 2023-07-09 ENCOUNTER — Ambulatory Visit (INDEPENDENT_AMBULATORY_CARE_PROVIDER_SITE_OTHER): Admitting: Neurology

## 2023-07-09 VITALS — BP 103/63 | HR 67 | Resp 20 | Ht 65.0 in | Wt 215.0 lb

## 2023-07-09 DIAGNOSIS — I671 Cerebral aneurysm, nonruptured: Secondary | ICD-10-CM

## 2023-07-09 DIAGNOSIS — H4920 Sixth [abducent] nerve palsy, unspecified eye: Secondary | ICD-10-CM | POA: Diagnosis not present

## 2023-07-09 MED ORDER — CLOPIDOGREL BISULFATE 75 MG PO TABS: 75.0000 mg | ORAL_TABLET | Freq: Every day | ORAL | 0 refills | Status: DC

## 2023-07-09 NOTE — Patient Instructions (Signed)
 Follow up with Dr. Alvira Josephs Follow up with me in 6 months.

## 2023-07-14 ENCOUNTER — Telehealth: Payer: Self-pay | Admitting: Student

## 2023-07-14 NOTE — Telephone Encounter (Signed)
 Fax received from [facility] regarding interaction with clopidogrel and fluoxetine.   Patient is scheduled for L ICA supraclinoid aneurysm treatment with  Dr. Dolphus on 7/7, she was instructed to start taking clopidogrel and aspirin  81 mg 5 days before the procedure.   Discussed with Dr. Dolphus, can switch to Brilinta 90 mg BID.   Called the patient and verified name and DOB, she states that she pickup the clopidogrel yesterday and she was informed by the pharmacist that she can take clopidogrel in the morning and fluoxetine in the afternoon to minimize interaction.   Patient was instructed to continue recommendation from the pharmacy, but she was informed that she must take clopidogrel and aspirin  in the morning of the procedure date, 07/28/23. She verbalized understanding.  Please call IR for questions and concerns.   Faizon Capozzi H Council Munguia PA-C 07/14/2023 1:05 PM

## 2023-07-15 DIAGNOSIS — Z9884 Bariatric surgery status: Secondary | ICD-10-CM | POA: Diagnosis not present

## 2023-07-18 ENCOUNTER — Encounter (HOSPITAL_COMMUNITY): Payer: Self-pay | Admitting: Interventional Radiology

## 2023-07-21 DIAGNOSIS — J4520 Mild intermittent asthma, uncomplicated: Secondary | ICD-10-CM | POA: Diagnosis not present

## 2023-07-21 DIAGNOSIS — E6609 Other obesity due to excess calories: Secondary | ICD-10-CM | POA: Diagnosis not present

## 2023-07-21 DIAGNOSIS — F4321 Adjustment disorder with depressed mood: Secondary | ICD-10-CM | POA: Diagnosis not present

## 2023-07-21 DIAGNOSIS — M4726 Other spondylosis with radiculopathy, lumbar region: Secondary | ICD-10-CM | POA: Diagnosis not present

## 2023-07-23 ENCOUNTER — Encounter (HOSPITAL_COMMUNITY): Payer: Self-pay | Admitting: Interventional Radiology

## 2023-07-23 ENCOUNTER — Other Ambulatory Visit: Payer: Self-pay

## 2023-07-23 DIAGNOSIS — Z9884 Bariatric surgery status: Secondary | ICD-10-CM | POA: Diagnosis not present

## 2023-07-23 DIAGNOSIS — E560 Deficiency of vitamin E: Secondary | ICD-10-CM | POA: Diagnosis not present

## 2023-07-23 NOTE — Progress Notes (Signed)
 SDW call  Patient was given pre-op instructions over the phone. Patient verbalized understanding of instructions provided.     PCP - Dr. Olam Pinal Cardiologist -  Pulmonary:    PPM/ICD - denies Device Orders - na Rep Notified - na   Chest x-ray - 06/27/2023 EKG -  06/30/2023 Stress Test - 2018 ECHO -  Cardiac Cath -   Sleep Study/sleep apnea/CPAP: denies  Non-diabetic  Blood Thinner Instructions: Plavix , continue Aspirin  Instructions:continue  ERAS Protcol - NPO  Anesthesia review: Yes. Brain aneurysm, diplopia   Patient denies shortness of breath, fever, cough and chest pain over the phone call  Your procedure is scheduled on Monday July 28, 2023  Report to [facility] Main Entrance A at 0530   A.M., then check in with the Admitting office.  Call this number if you have problems the morning of surgery:  [phone]   If you have any questions prior to your surgery date call [phone]: Open Monday-Friday 8am-4pm If you experience any cold or flu symptoms such as cough, fever, chills, shortness of breath, etc. between now and your scheduled surgery, please notify us  at the above number    Remember:  Do not eat or drink after midnight the night before your surgery  Take these medicines the morning of surgery with A SIP OF WATER :  ASA, atorvastatin, plavix , gabapentin , synthroid , claritin , protonix   As needed: Tylenol , albuterol  neb/inhaler, antivert, zanaflex  As of today, STOP taking any Aleve, Naproxen, Ibuprofen , Motrin , Advil , Goody's, BC's, all herbal medications, fish oil, and all vitamins.

## 2023-07-23 NOTE — Progress Notes (Signed)
 Anesthesia Chart Review: Rachel [name]  Case: [id number] Date/Time: 07/28/23 0730   Procedure: RADIOLOGY WITH ANESTHESIA - Diagnostic angiogram w/intent to treat aneurysm   Anesthesia type: General   Diagnosis: Brain aneurysm [I67.1]   Pre-op diagnosis: Brain aneurysm   [facility]   Surgeons: Dolphus Carrion, MD       DISCUSSION: Patient is a 68 year old female scheduled for the above procedure.  History includes never smoker, postoperative N/V, asthma, hypothyroidism, GERD, hiatal hernia (s/p repair 11/11/16), bariatric surgery (s/p laparoscopic biliopancreatic diversion with duodenal switch, hiatal hernia repair 11/11/16), nephrolithiasis (2018, 11/2022), migraines, osteoarthritis (left THA 04/02/06; right TKA 01/24/15). Per 11/28/15 sleep medicine evaluation with Dr. Lynwood Banter, HST showed Mild OSA and it dose not need to treated at this time.  ED visit 06/27/23 for diplopia, chest burning, diarrhea (started on Augment recently by PCP for suspected diverticulitis). Troponins x2 negative. EKG NSR. CXR without acute findings. CT abd/pelvis consistent with acute uncomplicated diverticulitis. MRI brain showed chronic microangiopathic white matter changes but no acute abnormality. CTA head/neck showed no emergent large vessel occlusion or high grade intracranial stenosis but did note a left ICA clinoid segment 3 x 2 mm aneurysm. Neurology felt she could have close out-patient neurology follow-up. She felt abdominal symptoms had started improving on Augmentin, so admission deferred. She denied chest pain and SOB at PAT RN interview.   She had neurology follow-up with Dr. Skeet on 07/09/23. Per 07/09/23 neurology visit with Dr. 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  Ischemic sixth nerve palsy is also possible.  Does not appear to be a neuromuscular junction disorder. Left supraclinoid  ICA aneurysm     She is undergoing cerebral angiogram and treatment of the aneurysm on 7/7.  Will see if symptoms resolve afterwards. Recommend following up with her ophthalmologist for a more thorough exam. Follow up after procedure/6 months.    She was evaluated by IR Dr. Dolphus on 07/04/23. Management options of cerebral aneurysm discussed. She desired to proceed with diagnostic arteriogram with intent to treat under general anesthesia. He recommended she start ASA and Plavix  5 days prior to procedure.  Anesthesia team to evaluate on the day of procedure.    VS: Ht 5' 5 (1.651 m)   Wt 95.3 kg   LMP 06/08/2009   BMI 34.95 kg/m  BP Readings from Last 3 Encounters:  07/09/23 103/63  06/28/23 116/66  12/24/22 118/74   Pulse Readings from Last 3 Encounters:  07/09/23 67  06/28/23 66  12/24/22 64     PROVIDERS: Cleotilde Planas, MD is PCP  Skeet Cornet, DO is neurologist   LABS: Most recent lab results in [facility] include: Lab Results  Component Value Date   WBC 6.8 06/27/2023   HGB 13.4 06/27/2023   HCT 42.4 06/27/2023   PLT 244 06/27/2023   GLUCOSE 92 06/27/2023   TRIG 37 03/05/2020   ALT 23 06/27/2023   AST 30 06/27/2023   NA 138 06/27/2023   K 3.9 06/27/2023   CL 101 06/27/2023   CREATININE 0.77 06/27/2023   BUN 14 06/27/2023   CO2 28 06/27/2023   INR 1.0 03/05/2020   HGBA1C 5.6 03/08/2020     IMAGES: CTA Head/Neck 06/27/23: IMPRESSION: 1. No emergent large vessel occlusion or high-grade stenosis of the intracranial arteries. 2. 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 3. Incidental left thyroid  nodule measuring 1.4 cm. No follow-up imaging is recommended. Reference: J Am Coll Radiol. 2015 Feb;12(2): 143-50   CT Abd/pelvis 06/27/23: IMPRESSION: 1. Acute uncomplicated diverticulitis at the junction of the descending/sigmoid colon. 2. Aortic Atherosclerosis (ICD10-I70.0).   CXR 06/27/23: FINDINGS: The heart  size and mediastinal contours are within normal limits. Both lungs are clear. Multilevel degenerative changes seen throughout the thoracic spine. IMPRESSION: No active cardiopulmonary disease.  MRI Brain 06/27/23: IMPRESSION: 1. No acute intracranial abnormality. 2. Chronic microangiopathic white matter changes.    EKG: 06/30/23: Sinus rhythm Low voltage, precordial leads No significant change was found Confirmed by Trine Likes [phone]) on 06/28/2023 1:31:18 AM   CV: She reported a prior stress test in 2018.  Past Medical History:  Diagnosis Date   Allergic rhinitis    GERD (gastroesophageal reflux disease)    H/O bariatric surgery 10/2016   History of hiatal hernia    History of kidney stones    Hypothyroidism    Migraines    Mild asthma    OA (osteoarthritis)    PONV (postoperative nausea and vomiting)    Recurrent upper respiratory infection (URI)    Renal calculus, bilateral    Wears glasses     Past Surgical History:  Procedure Laterality Date   ANKLE ARTHROSCOPY Left 01-27-2002    dr lesleigh  [facility]   BREAST SURGERY     lumpectomy x 2 - benign    CARPAL TUNNEL RELEASE Bilateral right 08-23-2003   dr sypher [facility]/  left 2006 approx.   CYSTOSCOPY WITH RETROGRADE PYELOGRAM, URETEROSCOPY AND STENT PLACEMENT Bilateral 01/20/2017   Procedure: CYSTOSCOPY WITH RETROGRADE PYELOGRAM, URETEROSCOPY AND STENT PLACEMENT;  Surgeon: Sherrilee Belvie CROME, MD;  Location: [facility];  Service: Urology;  Laterality: Bilateral;   CYSTOSCOPY/URETEROSCOPY/HOLMIUM LASER/STENT PLACEMENT Left 12/10/2022   Procedure: FIRST STAGE LEFT URETEROSCOPY/HOLMIUM LASER/RETROGRADE PYELOGRAM/STENT PLACEMENT;  Surgeon: Lovie Arlyss CROME, MD;  Location: [facility];  Service: Urology;  Laterality: Left;  90 MINUTES NEEDED FOR CASE   CYSTOSCOPY/URETEROSCOPY/HOLMIUM LASER/STENT PLACEMENT Left 12/24/2022   Procedure: SECOND STAGE LEFT  URETEROSCOPY/RETROGRADE PYELOGRAM/HOLMIUM  LASER/LEFT STENT EXCHANGED;  Surgeon: Lovie Arlyss CROME, MD;  Location: [facility];  Service: Urology;  Laterality: Left;  90 MINUTES NEEDED FOR CASE   DILATION AND CURETTAGE OF UTERUS     several    ESOPHAGOGASTRODUODENOSCOPY  09/2016   HOLMIUM LASER APPLICATION Bilateral 01/20/2017   Procedure: HOLMIUM LASER APPLICATION;  Surgeon: Sherrilee Belvie CROME, MD;  Location: [facility];  Service: Urology;  Laterality: Bilateral;   IR RADIOLOGIST EVAL & MGMT  07/04/2023   KNEE ARTHROSCOPY WITH MEDIAL MENISECTOMY Right 06/08/2013   Procedure: RIGHT KNEE ARTHROSCOPY WITH LATERAL MENISECTOMY with ABRASION CHRONDROPLASTY and SUPRAPATELLAR SYNOVECTOMY;  Surgeon: Tanda DELENA Heading, MD;  Location: [facility];  Service: Orthopedics;  Laterality: Right;   LAPAROSCOPIC CHOLECYSTECTOMY  1996   LAPAROSCOPIC GASTRIC RESTRICTIVE DUODENAL PROCEDURE (DUODENAL SWITCH)  11-11-2016     [facility] in [place] , [place]   AND HIATAL HERNIA REPAIR   TONSILLECTOMY  child   TOTAL HIP ARTHROPLASTY Left 04-02-2006   dr heading   [facility]   TOTAL KNEE ARTHROPLASTY Right 01/25/2015   Procedure: TOTAL KNEE ARTHROPLASTY;  Surgeon: Tanda Heading, MD;  Location: [facility];  Service: Orthopedics;  Laterality: Right;    MEDICATIONS: No current facility-administered medications for this encounter.    acetaminophen  (TYLENOL ) 500 MG tablet   albuterol  (PROVENTIL ) (2.5 MG/3ML) 0.083% nebulizer solution  albuterol  (VENTOLIN  HFA) 108 (90 Base) MCG/ACT inhaler   aspirin  EC 81 MG tablet   atorvastatin (LIPITOR) 10 MG tablet   calcium  citrate-vitamin D  500-500 MG-UNIT chewable tablet   clopidogrel  (PLAVIX ) 75 MG tablet   EPINEPHrine  0.3 mg/0.3 mL IJ SOAJ injection   FLUoxetine (PROZAC) 40 MG capsule   fluticasone  (FLONASE ) 50 MCG/ACT nasal spray   gabapentin  (NEURONTIN ) 300 MG capsule   levothyroxine  (SYNTHROID ) 50 MCG tablet   loratadine  (CLARITIN ) 10 MG tablet   meclizine (ANTIVERT) 12.5 MG tablet   Melatonin 10 MG TABS    Multiple Vitamin (MULTIVITAMIN) tablet   pantoprazole  (PROTONIX ) 40 MG tablet   Probiotic Product (PROBIOTIC DAILY PO)   tizanidine (ZANAFLEX) 2 MG capsule   traZODone  (DESYREL ) 50 MG tablet    Isaiah Ruder, PA-C Surgical Short Stay/Anesthesiology [facility] Phone [phone] [facility] Phone [phone] 07/23/2023 5:04 PM

## 2023-07-23 NOTE — Anesthesia Preprocedure Evaluation (Addendum)
 Anesthesia Evaluation  Patient identified by MRN, date of birth, ID band Patient awake    Reviewed: Allergy & Precautions, H&P , NPO status , Patient's Chart, lab work & pertinent test results  History of Anesthesia Complications (+) PONV and history of anesthetic complications  Airway Mallampati: II  TM Distance: >3 FB Neck ROM: Full    Dental no notable dental hx. (+) Teeth Intact, Dental Advisory Given   Pulmonary asthma , sleep apnea    Pulmonary exam normal breath sounds clear to auscultation       Cardiovascular Exercise Tolerance: Good negative cardio ROS  Rhythm:Regular Rate:Normal     Neuro/Psych  Headaches  negative psych ROS   GI/Hepatic Neg liver ROS, hiatal hernia,GERD  Medicated,,  Endo/Other  Hypothyroidism    Renal/GU negative Renal ROS  negative genitourinary   Musculoskeletal  (+) Arthritis ,    Abdominal   Peds  Hematology negative hematology ROS (+)   Anesthesia Other Findings   Reproductive/Obstetrics negative OB ROS                              Anesthesia Physical Anesthesia Plan  ASA: 2  Anesthesia Plan: General   Post-op Pain Management: Minimal or no pain anticipated   Induction: Intravenous  PONV Risk Score and Plan: 4 or greater and Ondansetron , Dexamethasone  and Scopolamine patch - Pre-op  Airway Management Planned: Oral ETT  Additional Equipment: Arterial line  Intra-op Plan:   Post-operative Plan: Extubation in OR  Informed Consent: I have reviewed the patients History and Physical, chart, labs and discussed the procedure including the risks, benefits and alternatives for the proposed anesthesia with the patient or authorized representative who has indicated his/her understanding and acceptance.     Dental advisory given  Plan Discussed with: CRNA  Anesthesia Plan Comments: (PAT note written 07/23/2023 by Allison Zelenak, PA-C.  )          Anesthesia Quick Evaluation

## 2023-07-24 ENCOUNTER — Other Ambulatory Visit: Payer: Self-pay | Admitting: Student

## 2023-07-24 ENCOUNTER — Other Ambulatory Visit (HOSPITAL_COMMUNITY)
Admission: RE | Admit: 2023-07-24 | Discharge: 2023-07-24 | Disposition: A | Discharge status: Home / Self Care | Attending: Interventional Radiology | Admitting: Interventional Radiology

## 2023-07-24 ENCOUNTER — Other Ambulatory Visit (HOSPITAL_COMMUNITY): Payer: Self-pay | Admitting: Interventional Radiology

## 2023-07-24 DIAGNOSIS — I671 Cerebral aneurysm, nonruptured: Secondary | ICD-10-CM | POA: Insufficient documentation

## 2023-07-24 DIAGNOSIS — Z01818 Encounter for other preprocedural examination: Secondary | ICD-10-CM

## 2023-07-24 LAB — PLATELET INHIBITION P2Y12: Platelet Function  P2Y12: 90 [PRU] — ABNORMAL LOW (ref 182–335)

## 2023-07-24 MED ORDER — SODIUM CHLORIDE 0.9 % IV SOLN: INTRAVENOUS | Status: DC

## 2023-07-24 NOTE — H&P (Signed)
 Chief Complaint: Left internal carotid artery intracranial aneurysm.  Referring Provider(s): Maude Galloway  Supervising Physician: Dolphus Carrion  Patient Status: [facility] - Out-pt  History of Present Illness: Rachel [name] is a 67 y.o. 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    She was seen in the ER on 06/27/2023 with diarrhea, dehydration, vomiting, abdominal pain and double vision.   CT angiogram of the brain showed an approximately 3 mm x 2 mm aneurysm of the supraclinoid left ICA projecting superiorly.   The largest dimension of the aneurysm on the coronal view suggested to be just above 4.2 mm.   She denies associated retro-orbital or supraorbital pain. No associated blindness or blurred vision.  No severe acute sudden headaches involving the left cerebral hemisphere, or the retro-orbital region. No history of photophobia, nausea, vomiting or speech difficulties. Denies history of incoordination, gait imbalance, or of limb paresthesias or motor weakness.   Patient reports two of her mother's brothers passed with ruptured brain aneurysms.  She was evaluated by Dr. Dolphus on 07/04/23. He discussed endovascular treatment options for the aneurysm and she is here today for the procedure.  She is NPO. No nausea/vomiting. No Fever/chills. ROS negative.   P2Y12 is 90  Patient is Full Code  Past Medical History:  Diagnosis Date   Allergic rhinitis    GERD (gastroesophageal reflux disease)    H/O bariatric surgery 10/2016   History of hiatal hernia    History of kidney stones    Hypothyroidism    Migraines    Mild asthma    OA (osteoarthritis)    PONV (postoperative nausea and vomiting)    Recurrent upper respiratory infection (URI)    Renal calculus, bilateral    Wears glasses     Past Surgical History:  Procedure Laterality Date   ANKLE ARTHROSCOPY Left 01-27-2002    dr lesleigh  [facility]   BREAST  SURGERY     lumpectomy x 2 - benign    CARPAL TUNNEL RELEASE Bilateral right 08-23-2003   dr sypher [facility]/  left 2006 approx.   CYSTOSCOPY WITH RETROGRADE PYELOGRAM, URETEROSCOPY AND STENT PLACEMENT Bilateral 01/20/2017   Procedure: CYSTOSCOPY WITH RETROGRADE PYELOGRAM, URETEROSCOPY AND STENT PLACEMENT;  Surgeon: Sherrilee Belvie CROME, MD;  Location[facility];  Service: Urology;  Laterality: Bilateral;   CYSTOSCOPY/URETEROSCOPY/HOLMIUM LASER/STENT PLACEMENT Left 12/10/2022   Procedure: FIRST STAGE LEFT URETEROSCOPY/HOLMIUM LASER/RETROGRADE PYELOGRAM/STENT PLACEMENT;  Surgeon: Lovie Arlyss CROME, MD;  Location: [facility];  Service: Urology;  Laterality: Left;  90 MINUTES NEEDED FOR CASE   CYSTOSCOPY/URETEROSCOPY/HOLMIUM LASER/STENT PLACEMENT Left 12/24/2022   Procedure: SECOND STAGE LEFT  URETEROSCOPY/RETROGRADE PYELOGRAM/HOLMIUM LASER/LEFT STENT EXCHANGED;  Surgeon: Lovie Arlyss CROME, MD;  Location: [facility];  Service: Urology;  Laterality: Left;  90 MINUTES NEEDED FOR CASE   DILATION AND CURETTAGE OF UTERUS     several    ESOPHAGOGASTRODUODENOSCOPY  09/2016   HOLMIUM LASER APPLICATION Bilateral 01/20/2017   Procedure: HOLMIUM LASER APPLICATION;  Surgeon: Sherrilee Belvie CROME, MD;  Location: [facility];  Service: Urology;  Laterality: Bilateral;   IR RADIOLOGIST EVAL & MGMT  07/04/2023   KNEE ARTHROSCOPY WITH MEDIAL MENISECTOMY Right 06/08/2013   Procedure: RIGHT KNEE ARTHROSCOPY WITH LATERAL MENISECTOMY with ABRASION CHRONDROPLASTY and SUPRAPATELLAR SYNOVECTOMY;  Surgeon: Tanda DELENA Heading, MD;  Location: [facility];  Service: Orthopedics;  Laterality: Right;   LAPAROSCOPIC CHOLECYSTECTOMY  1996   LAPAROSCOPIC GASTRIC RESTRICTIVE DUODENAL PROCEDURE (DUODENAL  SWITCH)  11-11-2016     [facility] in [place] , [place]   AND HIATAL HERNIA REPAIR   TONSILLECTOMY  child   TOTAL HIP ARTHROPLASTY Left 04-02-2006   dr heide   [facility]   TOTAL KNEE ARTHROPLASTY  Right 01/25/2015   Procedure: TOTAL KNEE ARTHROPLASTY;  Surgeon: Tanda heide, MD;  Location: [facility];  Service: Orthopedics;  Laterality: Right;    Allergies: Bee venom, Codeine, Guaifenesin & derivatives, Molds & smuts, and Pollen extract  Medications: Prior to Admission medications   Medication Sig Start Date End Date Taking? Authorizing Provider  acetaminophen  (TYLENOL ) 500 MG tablet Take 1,000 mg by mouth every 6 (six) hours as needed for mild pain.    [provider]  albuterol  (PROVENTIL ) (2.5 MG/3ML) 0.083% nebulizer solution Inhale 3 mLs into the lungs every 6 (six) hours as needed for shortness of breath.    [provider]  albuterol  (VENTOLIN  HFA) 108 (90 Base) MCG/ACT inhaler Inhale 1-2 puffs into the lungs every 4 (four) hours as needed for wheezing or shortness of breath.    [provider]  aspirin  EC 81 MG tablet Take 81 mg by mouth daily. Swallow whole.    [provider]  atorvastatin  (LIPITOR) 10 MG tablet Take 10 mg by mouth daily. 02/16/20   [provider]  calcium  citrate-vitamin D  500-500 MG-UNIT chewable tablet Chew 2 tablets by mouth daily.    [provider]  clopidogrel  (PLAVIX ) 75 MG tablet Take 1 tablet (75 mg total) by mouth daily. Continue taking 81 mg of aspirin  while taking the Plavix . 07/21/23   Han, Aimee H, PA-C  EPINEPHrine  0.3 mg/0.3 mL IJ SOAJ injection Inject 0.3 mg into the muscle as needed for anaphylaxis.     [provider]  FLUoxetine  (PROZAC ) 40 MG capsule Take 1 capsule by mouth daily.    [provider]  fluticasone  (FLONASE ) 50 MCG/ACT nasal spray Place 2 sprays into both nostrils at bedtime.     [provider]  gabapentin  (NEURONTIN ) 300 MG capsule Take 300 mg by mouth 3 (three) times daily. 12/24/18   [provider]  levothyroxine  (SYNTHROID ) 50 MCG tablet Take 50 mcg by mouth daily before breakfast.    [provider]  loratadine  (CLARITIN ) 10 MG  tablet Take 10 mg by mouth daily.    [provider]  meclizine (ANTIVERT) 12.5 MG tablet Take 12.5 mg by mouth 2 (two) times daily as needed for dizziness.    [provider]  Melatonin 10 MG TABS Take 10 mg by mouth at bedtime.    [provider]  Multiple Vitamin (MULTIVITAMIN) tablet Take 1 tablet by mouth 3 (three) times daily with meals. Bariatric MV    [provider]  pantoprazole  (PROTONIX ) 40 MG tablet Take 40 mg by mouth daily. 01/12/20   [provider]  Probiotic Product (PROBIOTIC DAILY PO) Take 2 capsules by mouth daily.    [provider]  tizanidine (ZANAFLEX) 2 MG capsule Take 2 mg by mouth 3 (three) times daily as needed for muscle spasms.    [provider]  traZODone  (DESYREL ) 50 MG tablet Take 50 mg by mouth at bedtime.    [provider]     Family History  Problem Relation Age of Onset   Asthma Mother    Asthma Sister    Urticaria Neg Hx    Eczema Neg Hx    Allergic rhinitis Neg Hx     Social History  Socioeconomic History   Marital status: Widowed    Spouse name: Not on file   Number of children: Not on file   Years of education: Not on file   Highest education level: Not on file  Occupational History   Not on file  Tobacco Use   Smoking status: Never   Smokeless tobacco: Never  Vaping Use   Vaping status: Never Used  Substance and Sexual Activity   Alcohol use: Not Currently   Drug use: No   Sexual activity: Not on file  Other Topics Concern   Not on file  Social History Narrative   Left handed   Two daughters   One story home    Husband passed away 15-Sep-2022   Social Drivers of Health   Financial Resource Strain: Not on file  Food Insecurity: Not on file  Transportation Needs: Not on file  Physical Activity: Not on file  Stress: Not on file  Social Connections: Not on file     Review of Systems: A 12 point ROS discussed and pertinent positives are indicated in the  HPI above.  All other systems are negative.    Vital Signs: BP 105/64 (BP Location: Right Arm)   Pulse 64   Temp 97.8 F (36.6 C) (Oral)   Resp 18   LMP 06/08/2009   SpO2 97%   Advance Care Plan: The advanced care place/surrogate decision maker was discussed at the time of visit and the patient did not wish to discuss or was not able to name a surrogate decision maker or provide an advance care plan.  Physical Exam Vitals reviewed.  Constitutional:      Appearance: Normal appearance.  HENT:     Head: Normocephalic and atraumatic.  Eyes:     Extraocular Movements: Extraocular movements intact.  Cardiovascular:     Rate and Rhythm: Normal rate and regular rhythm.  Pulmonary:     Effort: Pulmonary effort is normal. No respiratory distress.     Breath sounds: Normal breath sounds.  Abdominal:     Palpations: Abdomen is soft.  Musculoskeletal:        General: Normal range of motion.     Cervical back: Normal range of motion.  Skin:    General: Skin is warm and dry.  Neurological:     General: No focal deficit present.     Mental Status: She is alert and oriented to person, place, and time.  Psychiatric:        Mood and Affect: Mood normal.        Behavior: Behavior normal.        Thought Content: Thought content normal.        Judgment: Judgment normal.     Imaging: CLINICAL DATA:  Diplopia   EXAM: CT ANGIOGRAPHY HEAD AND NECK WITH AND WITHOUT CONTRAST   TECHNIQUE: Multidetector CT imaging of the head and neck was performed using the standard protocol during bolus administration of intravenous contrast. Multiplanar CT image reconstructions and MIPs were obtained to evaluate the vascular anatomy. Carotid stenosis measurements (when applicable) are obtained utilizing NASCET criteria, using the distal internal carotid diameter as the denominator.   RADIATION DOSE REDUCTION: This exam was performed according to the departmental dose-optimization program which  includes automated exposure control, adjustment of the mA and/or kV according to patient size and/or use of iterative reconstruction technique.   CONTRAST:  75mL OMNIPAQUE  IOHEXOL  350 MG/ML SOLN   COMPARISON:  None Available.   FINDINGS: CT HEAD FINDINGS  Brain: No mass,hemorrhage or extra-axial collection. Normal appearance of the parenchyma and CSF spaces.   Vascular: No hyperdense vessel or unexpected vascular calcification.   Skull: The visualized skull base, calvarium and extracranial soft tissues are normal.   Sinuses/Orbits: No fluid levels or advanced mucosal thickening of the visualized paranasal sinuses. No mastoid or middle ear effusion. Normal orbits.   CTA NECK FINDINGS   Skeleton: No acute abnormality or high grade bony spinal canal stenosis.   Other neck: Normal pharynx, larynx and major salivary glands. No cervical lymphadenopathy. 1.4 cm left thyroid  nodule.   Upper chest: No pneumothorax or pleural effusion. No nodules or masses.   Aortic arch: There is no calcific atherosclerosis of the aortic arch. Conventional 3 vessel aortic branching pattern.   RIGHT carotid system: Normal without aneurysm, dissection or stenosis.   LEFT carotid system: Normal without aneurysm, dissection or stenosis.   Vertebral arteries: Left dominant configuration. There is no dissection, occlusion or flow-limiting stenosis to the skull base (V1-V3 segments).   CTA HEAD FINDINGS   POSTERIOR CIRCULATION: Vertebral arteries are normal. No proximal occlusion of the anterior or inferior cerebellar arteries. Basilar artery is normal. Superior cerebellar arteries are normal. Posterior cerebral arteries are normal.   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 Anterior cerebral arteries are normal. Middle cerebral arteries are normal.   Venous sinuses: As permitted by contrast timing, patent.    Anatomic variants: None   Review of the MIP images confirms the above findings.   IMPRESSION: 1. No emergent large vessel occlusion or high-grade stenosis of the intracranial arteries. 2. 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 3. Incidental left thyroid  nodule measuring 1.4 cm. No follow-up imaging is recommended. Reference: J Am Coll Radiol. 2015 Feb;12(2): 143-50     Electronically Signed   By: Franky Stanford M.D.   On: 06/28/2023 00:17  Labs:  CBC: Recent Labs    06/27/23 1651 07/28/23 0644  WBC 6.8 4.6  HGB 13.4 12.5  HCT 42.4 39.9  PLT 244 150    COAGS: No results for input(s): INR, APTT in the last 8760 hours.  BMP: Recent Labs    06/27/23 1651 07/28/23 0644  NA 138 142  K 3.9 4.1  CL 101 108  CO2 28 25  GLUCOSE 92 81  BUN 14 12  CALCIUM  9.6 9.5  CREATININE 0.77 0.70  GFRNONAA >60 >60    LIVER FUNCTION TESTS: Recent Labs    06/27/23 1651  BILITOT 0.6  AST 30  ALT 23  ALKPHOS 86  PROT 7.3  ALBUMIN 3.6    TUMOR MARKERS: No results for input(s): AFPTM, CEA, CA199, CHROMGRNA in the last 8760 hours.  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  Will proceed with cerebral angiography and endovascular repair of the cerebral aneurysm today by Dr. Dolphus.  Risks and benefits of cerebral angiogram with intervention were discussed with the patient including, but not limited to bleeding, infection, vascular injury, contrast induced renal failure, stroke or even death.  This interventional procedure involves the use of X-rays and because of the nature of the planned procedure, it is possible that we will have prolonged use of X-ray fluoroscopy.  Potential radiation risks to you include (but are not limited to) the following: - A slightly elevated risk  for cancer  several years later in life. This risk is typically  less than 0.5% percent. This risk is low in comparison to the normal incidence of human cancer, which is 33% for women and 50% for men according to the American Cancer Society. - Radiation induced injury can include skin redness, resembling a rash, tissue breakdown / ulcers and hair loss (which can be temporary or permanent).   The likelihood of either of these occurring depends on the difficulty of the procedure and whether you are sensitive to radiation due to previous procedures, disease, or genetic conditions.   IF your procedure requires a prolonged use of radiation, you will be notified and given written instructions for further action.  It is your responsibility to monitor the irradiated area for the 2 weeks following the procedure and to notify your physician if you are concerned that you have suffered a radiation induced injury.    All of the patient's questions were answered, patient is agreeable to proceed.  Consent signed and in chart.   Electronically Signed: SARI GORMAN LAMP, PA-C   07/28/2023, 7:35 AM      I spent a total of    25 Minutes in face to face in clinical consultation, greater than 50% of which was counseling/coordinating care for cerebral aneurysm repair.

## 2023-07-24 NOTE — H&P (Deleted)
   The note originally documented on this encounter has been moved the the encounter in which it belongs.

## 2023-07-27 ENCOUNTER — Other Ambulatory Visit: Payer: Self-pay | Admitting: Student

## 2023-07-28 ENCOUNTER — Encounter (HOSPITAL_COMMUNITY): Payer: Self-pay | Admitting: Interventional Radiology

## 2023-07-28 ENCOUNTER — Inpatient Hospital Stay (HOSPITAL_COMMUNITY)
Admission: RE | Admit: 2023-07-28 | Discharge: 2023-07-29 | DRG: 027 | Disposition: A | Discharge status: Home / Self Care | Attending: Interventional Radiology | Admitting: Interventional Radiology

## 2023-07-28 ENCOUNTER — Inpatient Hospital Stay (HOSPITAL_COMMUNITY): Payer: Self-pay | Admitting: Vascular Surgery

## 2023-07-28 ENCOUNTER — Encounter (HOSPITAL_COMMUNITY)
Admission: RE | Disposition: A | Discharge status: Home / Self Care | Payer: Self-pay | Source: Home / Self Care | Attending: Interventional Radiology

## 2023-07-28 ENCOUNTER — Other Ambulatory Visit: Payer: Self-pay | Admitting: Student

## 2023-07-28 ENCOUNTER — Inpatient Hospital Stay (HOSPITAL_COMMUNITY)
Admission: RE | Admit: 2023-07-28 | Discharge: 2023-07-28 | Disposition: A | Discharge status: Home / Self Care | Source: Ambulatory Visit | Attending: Interventional Radiology | Admitting: Interventional Radiology

## 2023-07-28 ENCOUNTER — Other Ambulatory Visit: Payer: Self-pay

## 2023-07-28 VITALS — BP 105/64 | HR 64 | Temp 97.8°F | Resp 18

## 2023-07-28 DIAGNOSIS — E039 Hypothyroidism, unspecified: Secondary | ICD-10-CM | POA: Diagnosis present

## 2023-07-28 DIAGNOSIS — Z634 Disappearance and death of family member: Secondary | ICD-10-CM | POA: Diagnosis not present

## 2023-07-28 DIAGNOSIS — H532 Diplopia: Secondary | ICD-10-CM | POA: Diagnosis not present

## 2023-07-28 DIAGNOSIS — Z79899 Other long term (current) drug therapy: Secondary | ICD-10-CM

## 2023-07-28 DIAGNOSIS — Z885 Allergy status to narcotic agent status: Secondary | ICD-10-CM | POA: Diagnosis not present

## 2023-07-28 DIAGNOSIS — Z01818 Encounter for other preprocedural examination: Secondary | ICD-10-CM

## 2023-07-28 DIAGNOSIS — Z7982 Long term (current) use of aspirin: Secondary | ICD-10-CM | POA: Diagnosis not present

## 2023-07-28 DIAGNOSIS — J45909 Unspecified asthma, uncomplicated: Secondary | ICD-10-CM | POA: Diagnosis present

## 2023-07-28 DIAGNOSIS — Z96651 Presence of right artificial knee joint: Secondary | ICD-10-CM | POA: Diagnosis not present

## 2023-07-28 DIAGNOSIS — Z888 Allergy status to other drugs, medicaments and biological substances status: Secondary | ICD-10-CM

## 2023-07-28 DIAGNOSIS — Z825 Family history of asthma and other chronic lower respiratory diseases: Secondary | ICD-10-CM | POA: Diagnosis not present

## 2023-07-28 DIAGNOSIS — G588 Other specified mononeuropathies: Secondary | ICD-10-CM | POA: Diagnosis present

## 2023-07-28 DIAGNOSIS — Z87442 Personal history of urinary calculi: Secondary | ICD-10-CM | POA: Diagnosis not present

## 2023-07-28 DIAGNOSIS — I671 Cerebral aneurysm, nonruptured: Principal | ICD-10-CM | POA: Diagnosis present

## 2023-07-28 DIAGNOSIS — Z7902 Long term (current) use of antithrombotics/antiplatelets: Secondary | ICD-10-CM | POA: Diagnosis not present

## 2023-07-28 DIAGNOSIS — K219 Gastro-esophageal reflux disease without esophagitis: Secondary | ICD-10-CM | POA: Diagnosis not present

## 2023-07-28 DIAGNOSIS — Z9049 Acquired absence of other specified parts of digestive tract: Secondary | ICD-10-CM

## 2023-07-28 DIAGNOSIS — Z96642 Presence of left artificial hip joint: Secondary | ICD-10-CM | POA: Diagnosis not present

## 2023-07-28 DIAGNOSIS — Z79890 Hormone replacement therapy (postmenopausal): Secondary | ICD-10-CM

## 2023-07-28 DIAGNOSIS — G4733 Obstructive sleep apnea (adult) (pediatric): Secondary | ICD-10-CM | POA: Diagnosis present

## 2023-07-28 DIAGNOSIS — N179 Acute kidney failure, unspecified: Secondary | ICD-10-CM | POA: Diagnosis not present

## 2023-07-28 DIAGNOSIS — Z9889 Other specified postprocedural states: Secondary | ICD-10-CM

## 2023-07-28 HISTORY — PX: IR TRANSCATH/EMBOLIZ: IMG695

## 2023-07-28 HISTORY — PX: RADIOLOGY WITH ANESTHESIA: SHX6223

## 2023-07-28 HISTORY — PX: IR CT HEAD LTD: IMG2386

## 2023-07-28 HISTORY — PX: IR 3D INDEPENDENT WKST: IMG2385

## 2023-07-28 HISTORY — PX: IR ANGIO INTRA EXTRACRAN SEL COM CAROTID INNOMINATE UNI R MOD SED: IMG5359

## 2023-07-28 HISTORY — PX: IR ANGIO INTRA EXTRACRAN SEL INTERNAL CAROTID UNI L MOD SED: IMG5361

## 2023-07-28 LAB — BASIC METABOLIC PANEL WITH GFR
Anion gap: 9 (ref 5–15)
BUN: 12 mg/dL (ref 8–23)
CO2: 25 mmol/L (ref 22–32)
Calcium: 9.5 mg/dL (ref 8.9–10.3)
Chloride: 108 mmol/L (ref 98–111)
Creatinine, Ser: 0.7 mg/dL (ref 0.44–1.00)
GFR, Estimated: >60 mL/min (ref >60)
Glucose, Bld: 81 mg/dL (ref 70–99)
Potassium: 4.1 mmol/L (ref 3.5–5.1)
Sodium: 142 mmol/L (ref 135–145)

## 2023-07-28 LAB — CBC WITH DIFFERENTIAL/PLATELET
Abs Immature Granulocytes: 0.01 K/uL (ref 0.00–0.07)
Basophils Absolute: 0 K/uL (ref 0.0–0.1)
Basophils Relative: 0 %
Eosinophils Absolute: 0.2 K/uL (ref 0.0–0.5)
Eosinophils Relative: 4 %
HCT: 39.9 % (ref 36.0–46.0)
Hemoglobin: 12.5 g/dL (ref 12.0–15.0)
Immature Granulocytes: 0 %
Lymphocytes Relative: 30 %
Lymphs Abs: 1.4 K/uL (ref 0.7–4.0)
MCH: 28.2 pg (ref 26.0–34.0)
MCHC: 31.3 g/dL (ref 30.0–36.0)
MCV: 90.1 fL (ref 80.0–100.0)
Monocytes Absolute: 0.5 K/uL (ref 0.1–1.0)
Monocytes Relative: 12 %
Neutro Abs: 2.5 K/uL (ref 1.7–7.7)
Neutrophils Relative %: 54 %
Platelets: 150 K/uL (ref 150–400)
RBC: 4.43 MIL/uL (ref 3.87–5.11)
RDW: 14.6 % (ref 11.5–15.5)
WBC: 4.6 K/uL (ref 4.0–10.5)
nRBC: 0 % (ref 0.0–0.2)

## 2023-07-28 LAB — PROTIME-INR
INR: 1 (ref 0.8–1.2)
Prothrombin Time: 13.7 s (ref 11.4–15.2)

## 2023-07-28 LAB — POCT ACTIVATED CLOTTING TIME
Activated Clotting Time: 227 s
Activated Clotting Time: 233 s

## 2023-07-28 LAB — HEPARIN LEVEL (UNFRACTIONATED): Heparin Unfractionated: 0.1 [IU]/mL — ABNORMAL LOW (ref 0.30–0.70)

## 2023-07-28 SURGERY — RADIOLOGY WITH ANESTHESIA
Anesthesia: General

## 2023-07-28 MED ORDER — LORATADINE 10 MG PO TABS
10.0000 mg | ORAL_TABLET | Freq: Every day | ORAL | Status: DC
  Administered 2023-07-29: 10 mg via ORAL
  Filled 2023-07-28: qty 1

## 2023-07-28 MED ORDER — HEPARIN SODIUM (PORCINE) 1000 UNIT/ML IJ SOLN
INTRAMUSCULAR | Status: DC | PRN
Start: 2023-07-28 — End: 2023-07-28
  Administered 2023-07-28: 3000 [IU] via INTRAVENOUS

## 2023-07-28 MED ORDER — ONDANSETRON HCL 4 MG/2ML IJ SOLN
INTRAMUSCULAR | Status: DC | PRN
  Administered 2023-07-28: 4 mg via INTRAVENOUS

## 2023-07-28 MED ORDER — CLOPIDOGREL BISULFATE 75 MG PO TABS: 75.0000 mg | ORAL_TABLET | Freq: Every day | ORAL | 5 refills | Status: DC

## 2023-07-28 MED ORDER — TRAZODONE HCL 50 MG PO TABS
50.0000 mg | ORAL_TABLET | Freq: Every day | ORAL | Status: DC
  Administered 2023-07-28: 50 mg via ORAL
  Filled 2023-07-28: qty 1

## 2023-07-28 MED ORDER — PROPOFOL 10 MG/ML IV BOLUS
INTRAVENOUS | Status: DC | PRN
  Administered 2023-07-28: 120 mg via INTRAVENOUS

## 2023-07-28 MED ORDER — LIDOCAINE HCL 1 % IJ SOLN
20.0000 mL | Freq: Once | INTRAMUSCULAR | Status: AC
  Administered 2023-07-28: 10 mL via INTRADERMAL

## 2023-07-28 MED ORDER — LEVOTHYROXINE SODIUM 50 MCG PO TABS
50.0000 ug | ORAL_TABLET | Freq: Every day | ORAL | Status: DC
  Administered 2023-07-29: 50 ug via ORAL
  Filled 2023-07-28: qty 1

## 2023-07-28 MED ORDER — CEFAZOLIN SODIUM-DEXTROSE 2-4 GM/100ML-% IV SOLN
2.0000 g | INTRAVENOUS | Status: AC
  Administered 2023-07-28: 2 g via INTRAVENOUS

## 2023-07-28 MED ORDER — EPTIFIBATIDE 20 MG/10ML IV SOLN
INTRAVENOUS | Status: AC
  Filled 2023-07-28: qty 10

## 2023-07-28 MED ORDER — SODIUM CHLORIDE 0.9 % IV BOLUS
250.0000 mL | Freq: Once | INTRAVENOUS | Status: AC
  Administered 2023-07-28: 250 mL via INTRAVENOUS

## 2023-07-28 MED ORDER — HEPARIN (PORCINE) 25000 UT/250ML-% IV SOLN
500.0000 [IU]/h | INTRAVENOUS | Status: DC
  Administered 2023-07-28: 500 [IU]/h via INTRAVENOUS
  Filled 2023-07-28: qty 250

## 2023-07-28 MED ORDER — CHLORHEXIDINE GLUCONATE 0.12 % MT SOLN: 15.0000 mL | Freq: Once | OROMUCOSAL | Status: AC

## 2023-07-28 MED ORDER — CEFAZOLIN SODIUM-DEXTROSE 2-4 GM/100ML-% IV SOLN
INTRAVENOUS | Status: AC
  Filled 2023-07-28: qty 100

## 2023-07-28 MED ORDER — LIDOCAINE 2% (20 MG/ML) 5 ML SYRINGE
INTRAMUSCULAR | Status: DC | PRN
  Administered 2023-07-28: 60 mg via INTRAVENOUS

## 2023-07-28 MED ORDER — GABAPENTIN 100 MG PO CAPS
100.0000 mg | ORAL_CAPSULE | Freq: Three times a day (TID) | ORAL | Status: DC
  Administered 2023-07-28 – 2023-07-29 (×3): 100 mg via ORAL
  Filled 2023-07-28 (×3): qty 1

## 2023-07-28 MED ORDER — IOHEXOL 300 MG/ML  SOLN
100.0000 mL | Freq: Once | INTRAMUSCULAR | Status: AC | PRN
  Administered 2023-07-28: 25 mL via INTRA_ARTERIAL

## 2023-07-28 MED ORDER — ORAL CARE MOUTH RINSE: 15.0000 mL | Freq: Once | OROMUCOSAL | Status: AC

## 2023-07-28 MED ORDER — PANTOPRAZOLE SODIUM 40 MG PO TBEC
40.0000 mg | DELAYED_RELEASE_TABLET | Freq: Every day | ORAL | Status: DC
  Administered 2023-07-29: 40 mg via ORAL
  Filled 2023-07-28: qty 1

## 2023-07-28 MED ORDER — ACETAMINOPHEN 10 MG/ML IV SOLN
1000.0000 mg | Freq: Once | INTRAVENOUS | Status: AC
  Administered 2023-07-28: 1000 mg via INTRAVENOUS

## 2023-07-28 MED ORDER — FENTANYL CITRATE (PF) 100 MCG/2ML IJ SOLN
INTRAMUSCULAR | Status: AC
  Filled 2023-07-28: qty 2

## 2023-07-28 MED ORDER — EPTIFIBATIDE 20 MG/10ML IV SOLN
INTRAVENOUS | Status: AC | PRN
  Administered 2023-07-28 (×2): 1.5 mg via INTRAVENOUS

## 2023-07-28 MED ORDER — PHENYLEPHRINE HCL-NACL 20-0.9 MG/250ML-% IV SOLN
INTRAVENOUS | Status: DC | PRN
  Administered 2023-07-28: 15 ug/min via INTRAVENOUS

## 2023-07-28 MED ORDER — ACETAMINOPHEN 10 MG/ML IV SOLN
INTRAVENOUS | Status: AC
  Filled 2023-07-28: qty 100

## 2023-07-28 MED ORDER — ACETAMINOPHEN 650 MG RE SUPP: 650.0000 mg | RECTAL | Status: DC | PRN

## 2023-07-28 MED ORDER — SUGAMMADEX SODIUM 200 MG/2ML IV SOLN
INTRAVENOUS | Status: DC | PRN
  Administered 2023-07-28: 200 mg via INTRAVENOUS

## 2023-07-28 MED ORDER — CLOPIDOGREL BISULFATE 75 MG PO TABS: 75.0000 mg | ORAL_TABLET | Freq: Every day | ORAL | Status: DC

## 2023-07-28 MED ORDER — CHLORHEXIDINE GLUCONATE CLOTH 2 % EX PADS
6.0000 | MEDICATED_PAD | Freq: Every day | CUTANEOUS | Status: DC
  Administered 2023-07-28 – 2023-07-29 (×2): 6 via TOPICAL

## 2023-07-28 MED ORDER — ASPIRIN 81 MG PO CHEW
81.0000 mg | CHEWABLE_TABLET | Freq: Every day | ORAL | Status: DC
  Administered 2023-07-29: 81 mg via ORAL
  Filled 2023-07-28: qty 1

## 2023-07-28 MED ORDER — CHLORHEXIDINE GLUCONATE 0.12 % MT SOLN
OROMUCOSAL | Status: AC
  Administered 2023-07-28: 15 mL via OROMUCOSAL
  Filled 2023-07-28: qty 15

## 2023-07-28 MED ORDER — LACTATED RINGERS IV SOLN: INTRAVENOUS | Status: DC

## 2023-07-28 MED ORDER — ASPIRIN 81 MG PO CHEW: 81.0000 mg | CHEWABLE_TABLET | Freq: Every day | ORAL | Status: DC

## 2023-07-28 MED ORDER — ACETAMINOPHEN 160 MG/5ML PO SOLN: 650.0000 mg | ORAL | Status: DC | PRN

## 2023-07-28 MED ORDER — SODIUM CHLORIDE 0.9 % IV SOLN: INTRAVENOUS | Status: DC

## 2023-07-28 MED ORDER — CLOPIDOGREL BISULFATE 75 MG PO TABS
75.0000 mg | ORAL_TABLET | Freq: Every day | ORAL | Status: DC
  Administered 2023-07-29: 75 mg via ORAL
  Filled 2023-07-28: qty 1

## 2023-07-28 MED ORDER — ROCURONIUM BROMIDE 10 MG/ML (PF) SYRINGE
PREFILLED_SYRINGE | INTRAVENOUS | Status: DC | PRN
  Administered 2023-07-28: 60 mg via INTRAVENOUS
  Administered 2023-07-28: 20 mg via INTRAVENOUS

## 2023-07-28 MED ORDER — HEPARIN (PORCINE) 25000 UT/250ML-% IV SOLN: 500.0000 [IU]/h | INTRAVENOUS | Status: DC

## 2023-07-28 MED ORDER — SODIUM CHLORIDE 0.9 % IV BOLUS
250.0000 mL | Freq: Once | INTRAVENOUS | Status: AC
  Administered 2023-07-29: 250 mL via INTRAVENOUS

## 2023-07-28 MED ORDER — FENTANYL CITRATE (PF) 100 MCG/2ML IJ SOLN
25.0000 ug | INTRAMUSCULAR | Status: DC | PRN
  Administered 2023-07-28: 25 ug via INTRAVENOUS

## 2023-07-28 MED ORDER — ATORVASTATIN CALCIUM 10 MG PO TABS
10.0000 mg | ORAL_TABLET | Freq: Every day | ORAL | Status: DC
  Administered 2023-07-29: 10 mg via ORAL
  Filled 2023-07-28: qty 1

## 2023-07-28 MED ORDER — FENTANYL CITRATE (PF) 250 MCG/5ML IJ SOLN
INTRAMUSCULAR | Status: DC | PRN
  Administered 2023-07-28: 100 ug via INTRAVENOUS

## 2023-07-28 MED ORDER — NIMODIPINE 30 MG PO CAPS: 0.0000 mg | ORAL_CAPSULE | ORAL | Status: DC

## 2023-07-28 MED ORDER — LIDOCAINE HCL 1 % IJ SOLN
INTRAMUSCULAR | Status: AC
  Filled 2023-07-28: qty 20

## 2023-07-28 MED ORDER — CLEVIDIPINE BUTYRATE 0.5 MG/ML IV EMUL: 0.0000 mg/h | INTRAVENOUS | Status: DC

## 2023-07-28 MED ORDER — ACETAMINOPHEN 325 MG PO TABS
650.0000 mg | ORAL_TABLET | ORAL | Status: DC | PRN
  Administered 2023-07-28 – 2023-07-29 (×4): 650 mg via ORAL
  Filled 2023-07-28 (×4): qty 2

## 2023-07-28 MED ORDER — IOHEXOL 300 MG/ML  SOLN
150.0000 mL | Freq: Once | INTRAMUSCULAR | Status: AC | PRN
  Administered 2023-07-28: 100 mL via INTRA_ARTERIAL

## 2023-07-28 MED ORDER — FLUOXETINE HCL 20 MG PO CAPS
40.0000 mg | ORAL_CAPSULE | Freq: Every day | ORAL | Status: DC
  Administered 2023-07-29: 40 mg via ORAL
  Filled 2023-07-28 (×2): qty 2

## 2023-07-28 NOTE — Sedation Documentation (Signed)
 Patient transported to PACU with this RN and CRNA. Right femoral site assessed with PACU RN, site level 0, no hematoma, dressing clean, dry, intact.

## 2023-07-28 NOTE — Progress Notes (Signed)
 Referring Physician(s): Deveshwar,Sanjeev  Supervising Physician: Dolphus Carrion  Patient Status:  [facility] - In-pt  Chief Complaint:  Status post bilateral common carotid arteriograms and right vertebral artery angiogram, para ophthalmic aneurysm treatment with a 4.25 mm x 14 mm pipeline flex flow diverting device with stasis .   Subjective:  Patient lying flat in bed post treatment. Family at bedside. Denies HA, vision change, N/V, weakness, speech change.   Allergies: Bee venom, Codeine, Guaifenesin & derivatives, Molds & smuts, and Pollen extract  Medications: Prior to Admission medications   Medication Sig Start Date End Date Taking? Authorizing Provider  acetaminophen  (TYLENOL ) 500 MG tablet Take 1,000 mg by mouth every 6 (six) hours as needed for mild pain.   Yes [provider]  albuterol  (PROVENTIL ) (2.5 MG/3ML) 0.083% nebulizer solution Inhale 3 mLs into the lungs every 6 (six) hours as needed for shortness of breath.   Yes [provider]  aspirin  EC 81 MG tablet Take 81 mg by mouth daily. Swallow whole.   Yes [provider]  atorvastatin  (LIPITOR) 10 MG tablet Take 10 mg by mouth daily. 02/16/20  Yes [provider]  clopidogrel  (PLAVIX ) 75 MG tablet Take 1 tablet (75 mg total) by mouth daily. Continue taking 81 mg of aspirin  while taking the Plavix . 07/21/23  Yes Han, Aimee H, PA-C  FLUoxetine  (PROZAC ) 40 MG capsule Take 1 capsule by mouth daily.   Yes [provider]  fluticasone  (FLONASE ) 50 MCG/ACT nasal spray Place 2 sprays into both nostrils at bedtime.    Yes [provider]  gabapentin  (NEURONTIN ) 300 MG capsule Take 300 mg by mouth 3 (three) times daily. 12/24/18  Yes [provider]  levothyroxine  (SYNTHROID ) 50 MCG tablet Take 50 mcg by mouth daily before breakfast.   Yes [provider]  loratadine  (CLARITIN ) 10 MG tablet Take 10 mg by mouth daily.   Yes [provider]   Melatonin 10 MG TABS Take 10 mg by mouth at bedtime.   Yes [provider]  Multiple Vitamin (MULTIVITAMIN) tablet Take 1 tablet by mouth 3 (three) times daily with meals. Bariatric MV   Yes [provider]  pantoprazole  (PROTONIX ) 40 MG tablet Take 40 mg by mouth daily. 01/12/20  Yes [provider]  Probiotic Product (PROBIOTIC DAILY PO) Take 2 capsules by mouth daily.   Yes [provider]  tizanidine (ZANAFLEX) 2 MG capsule Take 2 mg by mouth 3 (three) times daily as needed for muscle spasms.   Yes [provider]  traZODone  (DESYREL ) 50 MG tablet Take 50 mg by mouth at bedtime.   Yes [provider]  albuterol  (VENTOLIN  HFA) 108 (90 Base) MCG/ACT inhaler Inhale 1-2 puffs into the lungs every 4 (four) hours as needed for wheezing or shortness of breath.    [provider]  calcium  citrate-vitamin D  500-500 MG-UNIT chewable tablet Chew 2 tablets by mouth daily.    [provider]  clopidogrel  (PLAVIX ) 75 MG tablet Take 1 tablet (75 mg total) by mouth daily. 07/28/23 01/24/24  Han, Aimee H, PA-C  EPINEPHrine  0.3 mg/0.3 mL IJ SOAJ injection Inject 0.3 mg into the muscle as needed for anaphylaxis.     [provider]  meclizine (ANTIVERT) 12.5 MG tablet Take 12.5 mg by mouth 2 (two) times daily as needed for dizziness.    [provider]     Vital Signs: BP 102/69   Pulse (!) 50   Temp (!) 97.5 F (36.4 C) (  Oral)   Resp 17   Ht 5' 5 (1.651 m)   Wt 210 lb (95.3 kg)   LMP 06/08/2009   SpO2 100%   BMI 34.95 kg/m   Physical Exam Alert, awake, and oriented x3. Speech and comprehension intact. PERRL bilaterally. EOMs intact bilaterally without nystagmus or subjective diplopia. Visual fields intact bilaterally. No facial asymmetry. Tongue midline. Motor power symmetric proportional to effort. No pronator drift. Fine motor and coordination intact and symmetric.  5/5 Strength bilaterally Common  femoral artery puncture site looks good, no bleeding, no hematoma, no pseudoaneurysm. There is a skin tear superior to puncture location from tape in the procedure. Dressed.   Imaging: No results found.  Labs:  CBC: Recent Labs    06/27/23 1651 07/28/23 0644  WBC 6.8 4.6  HGB 13.4 12.5  HCT 42.4 39.9  PLT 244 150    COAGS: Recent Labs    07/28/23 0644  INR 1.0    BMP: Recent Labs    06/27/23 1651 07/28/23 0644  NA 138 142  K 3.9 4.1  CL 101 108  CO2 28 25  GLUCOSE 92 81  BUN 14 12  CALCIUM  9.6 9.5  CREATININE 0.77 0.70  GFRNONAA >60 >60    LIVER FUNCTION TESTS: Recent Labs    06/27/23 1651  BILITOT 0.6  AST 30  ALT 23  ALKPHOS 86  PROT 7.3  ALBUMIN 3.6    Assessment and Plan:  R CFA puncture site did initially ooze. ICU RN held direct pressure for ~20 min; the site has since been without event. Will be able to elevated HOB at 6pm. Will begin to advance diet at that time.  Home meds discussed and ordered.   Patient directed with following: No Bending or stooping for 2 weeks. No pushing or pulling greater than 10 pounds for 2 weeks. No driving for 2 weeks.   Electronically Signed: Laymon Coast, NP 07/28/2023, 5:12 PM   I spent a total of 15 Minutes at the the patient's bedside AND on the patient's hospital floor or unit, greater than 50% of which was counseling/coordinating care for Status post bilateral common carotid arteriograms and right vertebral artery angiogram.

## 2023-07-28 NOTE — Anesthesia Procedure Notes (Signed)
 Procedure Name: Intubation Date/Time: 07/28/2023 8:55 AM  Performed by: Marva Lonni PARAS, CRNAPre-anesthesia Checklist: Patient identified, Emergency Drugs available, Suction available and Patient being monitored Patient Re-evaluated:Patient Re-evaluated prior to induction Oxygen  Delivery Method: Circle System Utilized Preoxygenation: Pre-oxygenation with 100% oxygen  Induction Type: IV induction Ventilation: Mask ventilation without difficulty Laryngoscope Size: Mac and 4 Grade View: Grade I Tube type: Oral Tube size: 7.0 mm Number of attempts: 1 Airway Equipment and Method: Stylet Placement Confirmation: ETT inserted through vocal cords under direct vision, positive ETCO2 and breath sounds checked- equal and bilateral Secured at: 22 cm Tube secured with: Tape Dental Injury: Teeth and Oropharynx as per pre-operative assessment

## 2023-07-28 NOTE — Progress Notes (Addendum)
 2027- Dr. Dolphus paged for patient soft blood pressures, below goal of 120-160. SBP running 105-108 when patient is relaxed. MAPs are appropriate >65. Stable NIH and femoral groin site.  Verbal orders for one time NS bolus and to increase continuous IV fluids to 85mL per hour.  Dr. Dolphus aware and okay with SBP below goal of 120-160 as long as MAPs are still >65 and no neurological/hemodynamic worsening.   2343- Dr. Dolphus paged again about low BP, art line and cuff correlating fairly well. When resting/asleep SBP dropping to mid 80s, with MAPs ranging from 58-64.   Patient is easy to arouse, neuro status unchanged and groin site stable.  Additional 250mL NS bolus ordered and initiated.

## 2023-07-28 NOTE — Anesthesia Procedure Notes (Signed)
 Arterial Line Insertion Start/End7/07/2023 7:23 AM, 07/28/2023 7:23 AM Performed by: Richarda Silvano HERO, CRNA  Preanesthetic checklist: patient identified, IV checked, site marked, risks and benefits discussed, surgical consent, monitors and equipment checked, pre-op evaluation and timeout performed Lidocaine  1% used for infiltration Left, radial was placed Catheter size: 20 G Hand hygiene performed , maximum sterile barriers used  and Seldinger technique used Allen's test indicative of satisfactory collateral circulation Attempts: 1 Procedure performed without using ultrasound guided technique. Following insertion, Biopatch and dressing applied. Post procedure assessment: normal  Patient tolerated the procedure well with no immediate complications.

## 2023-07-28 NOTE — Progress Notes (Signed)
 Orthopedic Tech Progress Note Patient Details:  Rachel [name] 1955/10/21 [id number]  Ortho Devices Type of Ortho Device: Knee Immobilizer Ortho Device/Splint Interventions: Ordered, Application   Post Interventions Patient Tolerated: Well  [name] [name] 07/28/2023, 2:29 PM

## 2023-07-28 NOTE — Anesthesia Postprocedure Evaluation (Signed)
 Anesthesia Post Note  Patient: Rachel [name]  Procedure(s) Performed: RADIOLOGY WITH ANESTHESIA     Patient location during evaluation: PACU Anesthesia Type: General Level of consciousness: awake and alert Pain management: pain level controlled Vital Signs Assessment: post-procedure vital signs reviewed and stable Respiratory status: spontaneous breathing, nonlabored ventilation and respiratory function stable Cardiovascular status: blood pressure returned to baseline and stable Postop Assessment: no apparent nausea or vomiting Anesthetic complications: no   No notable events documented.  Last Vitals:  Vitals:   07/28/23 1300 07/28/23 1315  BP: 113/73   Pulse: (!) 51 (!) 58  Resp: 14 15  Temp: 36.4 C   SpO2: 100% 98%    Last Pain:  Vitals:   07/28/23 1300  PainSc: 5                  Artemio Dobie,W. EDMOND

## 2023-07-28 NOTE — Procedures (Signed)
 INR.  Status post bilateral common carotid arteriograms and right vertebral artery angiogram.  Right CFA approach.  Findings.  1.  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    Status post treatment with a 4.25 mm x 14 mm pipeline flex flow diverting devise with stasis.  Past CT brain no evidence of  intracranial hemorrhage. . 8 French Angio-Seal closure device deployed at the right groin puncture site.  Patient extubated.  Denies any visual symptoms or nausea or vomiting.  Complaining of a mild left frontal orbital headache.  Pupils 3 mm equal bilaterally and sluggishly reactive.  No facial asymmetry.  Tongue midline.  Patient moves all fours equally proportional to effort.  GORMAN Nash MD.

## 2023-07-28 NOTE — Transfer of Care (Signed)
 Immediate Anesthesia Transfer of Care Note  Patient: Rachel [name]  Procedure(s) Performed: RADIOLOGY WITH ANESTHESIA  Patient Location: PACU  Anesthesia Type:General  Level of Consciousness: awake, alert , drowsy, and patient cooperative  Airway & Oxygen  Therapy: Patient Spontanous Breathing  Post-op Assessment: Report given to RN and Post -op Vital signs reviewed and stable  Post vital signs: Reviewed and stable  Last Vitals:  Vitals Value Taken Time  BP 128/73 07/28/23 12:20  Temp    Pulse 58 07/28/23 12:22  Resp 21 07/28/23 12:22  SpO2 97 % 07/28/23 12:22  Vitals shown include unfiled device data.  Last Pain:  Vitals:   07/28/23 0609  PainSc: 4          Complications: No notable events documented.

## 2023-07-28 NOTE — Progress Notes (Signed)
 PHARMACY - ANTICOAGULATION CONSULT NOTE  Pharmacy Consult for heparin  gtt Indication: post neuro IR  Allergies  Allergen Reactions   Bee Venom Anaphylaxis   Codeine Itching   Guaifenesin & Derivatives Anaphylaxis   Molds & Smuts Shortness Of Breath    Nose will burn   Pollen Extract     Eye Irritation Itchy Eyes, Burning Eyes, Sneezing    Patient Measurements: Height: 5' 5 (165.1 cm) Weight: 95.3 kg (210 lb) IBW/kg (Calculated) : 57 HEPARIN  DW (KG): 78.5  Vital Signs: Temp: 97.6 F (36.4 C) (07/07 1300) Temp Source: Oral (07/07 0608) BP: 113/73 (07/07 1300) Pulse Rate: 58 (07/07 1315)  Labs: Recent Labs    07/28/23 0644  HGB 12.5  HCT 39.9  PLT 150  LABPROT 13.7  INR 1.0  CREATININE 0.70    Estimated Creatinine Clearance: 79 mL/min (by C-G formula based on SCr of 0.7 mg/dL).   Medical History: Past Medical History:  Diagnosis Date   Allergic rhinitis    GERD (gastroesophageal reflux disease)    H/O bariatric surgery 10/2016   History of hiatal hernia    History of kidney stones    Hypothyroidism    Migraines    Mild asthma    OA (osteoarthritis)    PONV (postoperative nausea and vomiting)    Recurrent upper respiratory infection (URI)    Renal calculus, bilateral    Wears glasses     Assessment: 68 yo F s/p neuroIR with pipeline flexflow on 7/7. Pharmacy consulted for heparin  s/p IR to continue until 0800 on 7/8. No anticoag PTA  CBC from this AM WNL   Goal of Therapy:  Heparin  level 0.1-0.25 units/ml Monitor platelets by anticoagulation protocol: Yes   Plan:  Heparin  500u/hr 6hr heparin  level STOP heparin  drip at 0800 on 7/8.  Sharyne Glatter, PharmD, BCCCP Clinical Pharmacist 07/28/2023 1:54 PM

## 2023-07-28 NOTE — Progress Notes (Signed)
 PHARMACY - ANTICOAGULATION CONSULT NOTE  Pharmacy Consult for heparin  gtt Indication: post neuro IR  Allergies  Allergen Reactions   Bee Venom Anaphylaxis   Codeine Itching   Guaifenesin & Derivatives Anaphylaxis   Molds & Smuts Shortness Of Breath    Nose will burn   Pollen Extract     Eye Irritation Itchy Eyes, Burning Eyes, Sneezing    Patient Measurements: Height: 5' 5 (165.1 cm) Weight: 95.3 kg (210 lb) IBW/kg (Calculated) : 57 HEPARIN  DW (KG): 78.5  Vital Signs: Temp: 97.7 F (36.5 C) (07/07 2000) Temp Source: Oral (07/07 2000) BP: 107/56 (07/07 2000) Pulse Rate: 64 (07/07 2030)  Labs: Recent Labs    07/28/23 0644 07/28/23 2020  HGB 12.5  --   HCT 39.9  --   PLT 150  --   LABPROT 13.7  --   INR 1.0  --   HEPARINUNFRC  --  0.10*  CREATININE 0.70  --     Estimated Creatinine Clearance: 79 mL/min (by C-G formula based on SCr of 0.7 mg/dL).   Medical History: Past Medical History:  Diagnosis Date   Allergic rhinitis    GERD (gastroesophageal reflux disease)    H/O bariatric surgery 10/2016   History of hiatal hernia    History of kidney stones    Hypothyroidism    Migraines    Mild asthma    OA (osteoarthritis)    PONV (postoperative nausea and vomiting)    Recurrent upper respiratory infection (URI)    Renal calculus, bilateral    Wears glasses     Assessment: 68 yo F s/p neuroIR with pipeline flexflow on 7/7. Pharmacy consulted for heparin  s/p IR to continue until 0800 on 7/8. No anticoag PTA  CBC from this AM WNL  Heparin  level this evening is within goal range at 0.1.  No overt bleeding or complications noted.  Goal of Therapy:  Heparin  level 0.1-0.25 units/ml Monitor platelets by anticoagulation protocol: Yes   Plan:  Continue IV Heparin  500u/hr AM heparin  level. STOP heparin  drip at 0800 on 7/8.  Harlene Barlow, [employer], BCCP Clinical Pharmacist  07/28/2023 9:14 PM   [facility] pharmacy phone numbers are listed on  [email or web address]

## 2023-07-29 ENCOUNTER — Encounter (HOSPITAL_COMMUNITY): Payer: Self-pay | Admitting: Interventional Radiology

## 2023-07-29 LAB — CBC WITH DIFFERENTIAL/PLATELET
Abs Immature Granulocytes: 0.01 K/uL (ref 0.00–0.07)
Basophils Absolute: 0 K/uL (ref 0.0–0.1)
Basophils Relative: 0 %
Eosinophils Absolute: 0.2 K/uL (ref 0.0–0.5)
Eosinophils Relative: 4 %
HCT: 31.8 % — ABNORMAL LOW (ref 36.0–46.0)
Hemoglobin: 10.1 g/dL — ABNORMAL LOW (ref 12.0–15.0)
Immature Granulocytes: 0 %
Lymphocytes Relative: 35 %
Lymphs Abs: 1.3 K/uL (ref 0.7–4.0)
MCH: 28.9 pg (ref 26.0–34.0)
MCHC: 31.8 g/dL (ref 30.0–36.0)
MCV: 90.9 fL (ref 80.0–100.0)
Monocytes Absolute: 0.4 K/uL (ref 0.1–1.0)
Monocytes Relative: 10 %
Neutro Abs: 1.9 K/uL (ref 1.7–7.7)
Neutrophils Relative %: 51 %
Platelets: 113 K/uL — ABNORMAL LOW (ref 150–400)
RBC: 3.5 MIL/uL — ABNORMAL LOW (ref 3.87–5.11)
RDW: 14.7 % (ref 11.5–15.5)
WBC: 3.8 K/uL — ABNORMAL LOW (ref 4.0–10.5)
nRBC: 0 % (ref 0.0–0.2)

## 2023-07-29 LAB — BASIC METABOLIC PANEL WITH GFR
Anion gap: 5 (ref 5–15)
BUN: 6 mg/dL — ABNORMAL LOW (ref 8–23)
CO2: 23 mmol/L (ref 22–32)
Calcium: 8.7 mg/dL — ABNORMAL LOW (ref 8.9–10.3)
Chloride: 112 mmol/L — ABNORMAL HIGH (ref 98–111)
Creatinine, Ser: 0.55 mg/dL (ref 0.44–1.00)
GFR, Estimated: >60 mL/min (ref >60)
Glucose, Bld: 89 mg/dL (ref 70–99)
Potassium: 4 mmol/L (ref 3.5–5.1)
Sodium: 140 mmol/L (ref 135–145)

## 2023-07-29 NOTE — Discharge Summary (Signed)
 Physician Discharge Summary  Patient ID: Rachel [name] MRN: [id number] DOB/AGE: April 09, 1955 68 y.o.  Admit date: 07/28/2023 Discharge date: 07/29/2023  Admission Diagnoses:  Discharge Diagnoses:  Principal Problem:   Status post coil embolization of cerebral aneurysm Active Problems:   Brain aneurysm   Discharged Condition: good  Hospital Course: Status post bilateral common carotid arteriograms and right vertebral artery angiogram, para ophthalmic aneurysm treatment with a 4.25 mm x 14 mm pipeline flex flow diverting device with stasis on 7/7 with Dr. Dolphus. She is up in chair, eating and drinking, able to walk to restroom with baseline walker. Endorses slight L orbital HA that has improved since last night. No N/V, dizziness, visual disturbance.   Consults: neurology  Significant Diagnostic Studies:  radiology: bilateral common carotid arteriograms and right vertebral artery angiogram  Treatments: aneurysm treatment with a 4.25 mm x 14 mm pipeline flex flow diverting device  Discharge Exam: Blood pressure (!) 99/44, pulse 67, temperature 98.3 F (36.8 C), temperature source Oral, resp. rate 17, height 5' 5 (1.651 m), weight 210 lb (95.3 kg), last menstrual period 06/08/2009, SpO2 98%.  BP discussed with patient, reported soft at baseline, asymptomatic.  Alert, awake, and oriented x3. Speech and comprehension intact. PERRL bilaterally. EOMs intact bilaterally without nystagmus or subjective diplopia. Visual fields intact bilaterally. No facial asymmetry. Tongue midline. Motor power symmetric proportional to effort. No pronator drift. Fine motor and coordination intact and symmetric.  5/5 Strength bilaterally Common femoral artery puncture site looks good, no bleeding, no hematoma, no pseudoaneurysm. Skin tear well dressed.   Disposition: Home; follow up with Dr. Dolphus in 2 weeks. Call you PCP to make them aware of your hospitalization.    Allergies as of 07/29/2023        Reactions   Bee Venom Anaphylaxis   Codeine Itching   Guaifenesin & Derivatives Anaphylaxis   Molds & Smuts Shortness Of Breath   Nose will burn   Pollen Extract    Eye Irritation Itchy Eyes, Burning Eyes, Sneezing        Medication List     TAKE these medications    acetaminophen  500 MG tablet Commonly known as: TYLENOL  Take 1,000 mg by mouth every 6 (six) hours as needed for mild pain.   albuterol  (2.5 MG/3ML) 0.083% nebulizer solution Commonly known as: PROVENTIL  Inhale 3 mLs into the lungs every 6 (six) hours as needed for shortness of breath.   albuterol  108 (90 Base) MCG/ACT inhaler Commonly known as: VENTOLIN  HFA Inhale 1-2 puffs into the lungs every 4 (four) hours as needed for wheezing or shortness of breath.   aspirin  EC 81 MG tablet Take 81 mg by mouth daily. Swallow whole.   atorvastatin  10 MG tablet Commonly known as: LIPITOR Take 10 mg by mouth daily.   calcium  citrate-vitamin D  500-500 MG-UNIT chewable tablet Chew 2 tablets by mouth daily.   clopidogrel  75 MG tablet Commonly known as: Plavix  Take 1 tablet (75 mg total) by mouth daily. Continue taking 81 mg of aspirin  while taking the Plavix .   EPINEPHrine  0.3 mg/0.3 mL Soaj injection Commonly known as: EPI-PEN Inject 0.3 mg into the muscle as needed for anaphylaxis.   FLUoxetine  40 MG capsule Commonly known as: PROZAC  Take 1 capsule by mouth daily.   fluticasone  50 MCG/ACT nasal spray Commonly known as: FLONASE  Place 2 sprays into both nostrils at bedtime.   gabapentin  300 MG capsule Commonly known as: NEURONTIN  Take 300 mg by mouth 3 (three) times daily.   levothyroxine   50 MCG tablet Commonly known as: SYNTHROID  Take 50 mcg by mouth daily before breakfast.   loratadine  10 MG tablet Commonly known as: CLARITIN  Take 10 mg by mouth daily.   meclizine 12.5 MG tablet Commonly known as: ANTIVERT Take 12.5 mg by mouth 2 (two) times daily as needed for dizziness.   Melatonin 10  MG Tabs Take 10 mg by mouth at bedtime.   multivitamin tablet Take 1 tablet by mouth 3 (three) times daily with meals. Bariatric MV   pantoprazole  40 MG tablet Commonly known as: PROTONIX  Take 40 mg by mouth daily.   PROBIOTIC DAILY PO Take 2 capsules by mouth daily.   tizanidine 2 MG capsule Commonly known as: ZANAFLEX Take 2 mg by mouth 3 (three) times daily as needed for muscle spasms.   traZODone  50 MG tablet Commonly known as: DESYREL  Take 50 mg by mouth at bedtime.        Follow-up Information     Cleotilde Planas, MD Follow up.   Specialty: Family Medicine Contact information: [address] [place] [address] [phone]         Dolphus Carrion, MD Follow up.   Specialties: Interventional Radiology, Radiology Why: f/u in 2 weeks, our office will call you to set this up Contact information: [address] [place] [address] [phone]                 Signed: Laymon Coast 07/29/2023, 9:17 AM

## 2023-07-29 NOTE — Discharge Instructions (Signed)
 No Bending or stooping for 2 weeks. No pushing or pulling greater than 10 pounds for 2 weeks. No driving for 2 weeks.   Keep the puncture site clean and dry. Call with concerns: [phone]. Present to ED with emergent concerns.   Expect pressure behind L eye and slight HA from vascular changes with neuro intervention.   Warning signs for acute stroke have been added to you discharge as well.

## 2023-07-29 NOTE — Plan of Care (Signed)
  Problem: Education: Goal: Knowledge of General Education information will improve Description: Including pain rating scale, medication(s)/side effects and non-pharmacologic comfort measures Outcome: Progressing   Problem: Health Behavior/Discharge Planning: Goal: Ability to manage health-related needs will improve Outcome: Progressing   Problem: Clinical Measurements: Goal: Will remain free from infection Outcome: Progressing   Problem: Nutrition: Goal: Adequate nutrition will be maintained Outcome: Progressing   Problem: Elimination: Goal: Will not experience complications related to bowel motility Outcome: Progressing   Problem: Pain Managment: Goal: General experience of comfort will improve and/or be controlled Outcome: Progressing   Problem: Cardiovascular: Goal: Vascular access site(s) Level 0-1 will be maintained Outcome: Progressing

## 2023-07-29 NOTE — Plan of Care (Signed)

## 2023-07-30 ENCOUNTER — Encounter (HOSPITAL_COMMUNITY): Payer: Self-pay | Admitting: Interventional Radiology

## 2023-07-30 ENCOUNTER — Other Ambulatory Visit (HOSPITAL_COMMUNITY): Payer: Self-pay | Admitting: Interventional Radiology

## 2023-07-30 DIAGNOSIS — I671 Cerebral aneurysm, nonruptured: Secondary | ICD-10-CM

## 2023-07-30 HISTORY — PX: IR NEURO EACH ADD'L AFTER BASIC UNI LEFT (MS): IMG5373

## 2023-07-30 HISTORY — PX: IR ANGIOGRAM FOLLOW UP STUDY: IMG697

## 2023-08-06 DIAGNOSIS — Z9889 Other specified postprocedural states: Secondary | ICD-10-CM | POA: Diagnosis not present

## 2023-08-06 DIAGNOSIS — Z8679 Personal history of other diseases of the circulatory system: Secondary | ICD-10-CM | POA: Diagnosis not present

## 2023-08-06 DIAGNOSIS — T148XXA Other injury of unspecified body region, initial encounter: Secondary | ICD-10-CM | POA: Diagnosis not present

## 2023-08-12 ENCOUNTER — Other Ambulatory Visit (HOSPITAL_COMMUNITY): Payer: Self-pay

## 2023-08-12 ENCOUNTER — Ambulatory Visit (HOSPITAL_COMMUNITY)
Admission: RE | Admit: 2023-08-12 | Discharge: 2023-08-12 | Disposition: A | Discharge status: Home / Self Care | Source: Ambulatory Visit

## 2023-08-12 DIAGNOSIS — I671 Cerebral aneurysm, nonruptured: Secondary | ICD-10-CM | POA: Insufficient documentation

## 2023-08-12 DIAGNOSIS — I729 Aneurysm of unspecified site: Secondary | ICD-10-CM

## 2023-08-12 DIAGNOSIS — Z7982 Long term (current) use of aspirin: Secondary | ICD-10-CM | POA: Insufficient documentation

## 2023-08-12 DIAGNOSIS — Z7902 Long term (current) use of antithrombotics/antiplatelets: Secondary | ICD-10-CM | POA: Diagnosis not present

## 2023-08-12 LAB — PLATELET INHIBITION P2Y12: Platelet Function  P2Y12: 32 [PRU] — ABNORMAL LOW (ref 182–335)

## 2023-08-13 HISTORY — PX: IR RADIOLOGIST EVAL & MGMT: IMG5224

## 2023-08-15 ENCOUNTER — Telehealth: Payer: Self-pay

## 2023-08-15 ENCOUNTER — Other Ambulatory Visit: Payer: Self-pay | Admitting: Student

## 2023-08-15 DIAGNOSIS — I671 Cerebral aneurysm, nonruptured: Secondary | ICD-10-CM

## 2023-08-15 MED ORDER — CLOPIDOGREL BISULFATE 75 MG PO TABS: 75.0000 mg | ORAL_TABLET | Freq: Every day | ORAL | 5 refills | Status: DC

## 2023-08-15 NOTE — Telephone Encounter (Signed)
 At recent follow up with Dr. Dolphus for 7/7 aneurysm intervention, patient mentioned epistaxis episode and bruising on her plavix /asa regimen. A p2y12 was obtained at that time. Resulted 32. Discussed with Dr. Dolphus who recommends: - continue asa - continue plavix  75 mg daily for 7 days - after 7 days, change plavix  dose to 37.5 mg daily. - return for p2y12 level 5-7 days after this change  Patient called and communicated this to her.

## 2023-08-19 ENCOUNTER — Other Ambulatory Visit: Payer: Self-pay

## 2023-08-19 MED ORDER — CLOPIDOGREL BISULFATE 75 MG PO TABS: 75.0000 mg | ORAL_TABLET | Freq: Every day | ORAL | 5 refills | Status: AC

## 2023-08-20 DIAGNOSIS — Z96651 Presence of right artificial knee joint: Secondary | ICD-10-CM | POA: Diagnosis not present

## 2023-08-20 DIAGNOSIS — M25561 Pain in right knee: Secondary | ICD-10-CM | POA: Diagnosis not present

## 2023-08-21 DIAGNOSIS — J4520 Mild intermittent asthma, uncomplicated: Secondary | ICD-10-CM | POA: Diagnosis not present

## 2023-08-21 DIAGNOSIS — M4726 Other spondylosis with radiculopathy, lumbar region: Secondary | ICD-10-CM | POA: Diagnosis not present

## 2023-08-26 DIAGNOSIS — R197 Diarrhea, unspecified: Secondary | ICD-10-CM | POA: Diagnosis not present

## 2023-08-26 DIAGNOSIS — K921 Melena: Secondary | ICD-10-CM | POA: Diagnosis not present

## 2023-09-01 ENCOUNTER — Other Ambulatory Visit (HOSPITAL_COMMUNITY): Payer: Self-pay

## 2023-09-01 ENCOUNTER — Other Ambulatory Visit: Payer: Self-pay

## 2023-09-01 ENCOUNTER — Other Ambulatory Visit (HOSPITAL_COMMUNITY): Admission: RE | Admit: 2023-09-01 | Discharge: 2023-09-01 | Disposition: A | Discharge status: Home / Self Care

## 2023-09-01 DIAGNOSIS — I671 Cerebral aneurysm, nonruptured: Secondary | ICD-10-CM

## 2023-09-02 ENCOUNTER — Other Ambulatory Visit (HOSPITAL_COMMUNITY): Payer: Self-pay | Admitting: Radiology

## 2023-09-02 ENCOUNTER — Telehealth (HOSPITAL_COMMUNITY): Payer: Self-pay

## 2023-09-02 DIAGNOSIS — I671 Cerebral aneurysm, nonruptured: Secondary | ICD-10-CM

## 2023-09-02 DIAGNOSIS — I729 Aneurysm of unspecified site: Secondary | ICD-10-CM

## 2023-09-02 NOTE — Telephone Encounter (Signed)
 Pt is aware that Dr. Dolphus is no longer here. Will get her referred over to see Dr. Lanis. AB

## 2023-09-21 DIAGNOSIS — M4726 Other spondylosis with radiculopathy, lumbar region: Secondary | ICD-10-CM | POA: Diagnosis not present

## 2023-09-21 DIAGNOSIS — J4520 Mild intermittent asthma, uncomplicated: Secondary | ICD-10-CM | POA: Diagnosis not present

## 2023-10-02 DIAGNOSIS — Z23 Encounter for immunization: Secondary | ICD-10-CM | POA: Diagnosis not present

## 2023-10-02 DIAGNOSIS — Z0000 Encounter for general adult medical examination without abnormal findings: Secondary | ICD-10-CM | POA: Diagnosis not present

## 2023-10-09 DIAGNOSIS — I671 Cerebral aneurysm, nonruptured: Secondary | ICD-10-CM | POA: Diagnosis not present

## 2023-10-21 DIAGNOSIS — J4520 Mild intermittent asthma, uncomplicated: Secondary | ICD-10-CM | POA: Diagnosis not present

## 2023-10-21 DIAGNOSIS — M4726 Other spondylosis with radiculopathy, lumbar region: Secondary | ICD-10-CM | POA: Diagnosis not present

## 2023-10-23 DIAGNOSIS — E039 Hypothyroidism, unspecified: Secondary | ICD-10-CM | POA: Diagnosis not present

## 2023-10-23 DIAGNOSIS — E162 Hypoglycemia, unspecified: Secondary | ICD-10-CM | POA: Diagnosis not present

## 2023-10-23 DIAGNOSIS — B372 Candidiasis of skin and nail: Secondary | ICD-10-CM | POA: Diagnosis not present

## 2023-11-06 DIAGNOSIS — Z96651 Presence of right artificial knee joint: Secondary | ICD-10-CM | POA: Diagnosis not present

## 2023-11-06 DIAGNOSIS — Z96642 Presence of left artificial hip joint: Secondary | ICD-10-CM | POA: Diagnosis not present

## 2023-11-06 DIAGNOSIS — M25552 Pain in left hip: Secondary | ICD-10-CM | POA: Diagnosis not present

## 2023-11-06 DIAGNOSIS — M25561 Pain in right knee: Secondary | ICD-10-CM | POA: Diagnosis not present

## 2023-11-07 ENCOUNTER — Other Ambulatory Visit (HOSPITAL_COMMUNITY): Payer: Self-pay | Admitting: Orthopedic Surgery

## 2023-11-07 DIAGNOSIS — M25552 Pain in left hip: Secondary | ICD-10-CM

## 2023-11-07 DIAGNOSIS — N200 Calculus of kidney: Secondary | ICD-10-CM | POA: Diagnosis not present

## 2023-11-08 DIAGNOSIS — S80862A Insect bite (nonvenomous), left lower leg, initial encounter: Secondary | ICD-10-CM | POA: Diagnosis not present

## 2023-11-08 DIAGNOSIS — L03116 Cellulitis of left lower limb: Secondary | ICD-10-CM | POA: Diagnosis not present

## 2023-11-10 DIAGNOSIS — M47812 Spondylosis without myelopathy or radiculopathy, cervical region: Secondary | ICD-10-CM | POA: Diagnosis not present

## 2023-11-10 DIAGNOSIS — S06320A Contusion and laceration of left cerebrum without loss of consciousness, initial encounter: Secondary | ICD-10-CM | POA: Diagnosis not present

## 2023-11-10 DIAGNOSIS — Z043 Encounter for examination and observation following other accident: Secondary | ICD-10-CM | POA: Diagnosis not present

## 2023-11-10 DIAGNOSIS — M85852 Other specified disorders of bone density and structure, left thigh: Secondary | ICD-10-CM | POA: Diagnosis not present

## 2023-11-10 DIAGNOSIS — Z96651 Presence of right artificial knee joint: Secondary | ICD-10-CM | POA: Diagnosis not present

## 2023-11-10 DIAGNOSIS — E042 Nontoxic multinodular goiter: Secondary | ICD-10-CM | POA: Diagnosis not present

## 2023-11-10 DIAGNOSIS — S72002A Fracture of unspecified part of neck of left femur, initial encounter for closed fracture: Secondary | ICD-10-CM | POA: Diagnosis not present

## 2023-11-10 DIAGNOSIS — S0990XA Unspecified injury of head, initial encounter: Secondary | ICD-10-CM | POA: Diagnosis not present

## 2023-11-10 DIAGNOSIS — M5021 Other cervical disc displacement,  high cervical region: Secondary | ICD-10-CM | POA: Diagnosis not present

## 2023-11-10 DIAGNOSIS — S32502A Unspecified fracture of left pubis, initial encounter for closed fracture: Secondary | ICD-10-CM | POA: Diagnosis not present

## 2023-11-10 DIAGNOSIS — S32402A Unspecified fracture of left acetabulum, initial encounter for closed fracture: Secondary | ICD-10-CM | POA: Diagnosis not present

## 2023-11-10 DIAGNOSIS — S32592A Other specified fracture of left pubis, initial encounter for closed fracture: Secondary | ICD-10-CM | POA: Diagnosis not present

## 2023-11-10 DIAGNOSIS — Z96642 Presence of left artificial hip joint: Secondary | ICD-10-CM | POA: Diagnosis not present

## 2023-11-10 DIAGNOSIS — M8580 Other specified disorders of bone density and structure, unspecified site: Secondary | ICD-10-CM | POA: Diagnosis not present

## 2023-11-10 DIAGNOSIS — Z885 Allergy status to narcotic agent status: Secondary | ICD-10-CM | POA: Diagnosis not present

## 2023-11-10 NOTE — ED Provider Notes (Signed)
 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 She also sustained a contusion to the back of her head with a large bruise but denied loss of consciousness, nausea, vomiting, or neck pain. Examination revealed tenderness over the left hip, a large occipital hematoma, and no neurovascular compromise or limb deformity. Imaging showed a slightly displaced pelvic fracture near the hip's ball and socket, and head/neck CT scans were negative for acute pathology.  Differential diagnosis includes, but is not limited to: - Displaced Pelvic Fracture: Imaging confirmed a slightly displaced pelvic fracture near the hip's ball and socket, correlating with the patient's pain, inability to bear weight, and mechanism of injury. - Head Contusion: A large occipital hematoma was present after the fall, but absence of neurological symptoms and a negative head CT ruled out intracranial bleeding.  Displaced Pelvic Fracture - Consulted orthopedics for further evaluation and management. - Administered pain medication for discomfort.  Head Contusion - Advised monitoring for any new or worsening symptoms related to the head contusion.  Patient presents for evaluation of fall with head injury that occurred shortly prior to arrival.  Notes left hip pain.  CT imaging, x-rays performed prior to my assessment.  CT head and neck are negative for acute traumatic change.  X-ray of left  hip notable for displaced inferior pubic rami fracture.  I did discuss the case with orthopedics, see below.  Ultimately, recommending close outpatient follow-up with toe-touch weightbearing.  They do not see any indication for hospitalization or immediate surgical intervention at this time.  I discussed the results with the patient.  We had an at length discussion with regard to disposition the patient's comfort level with being able to tolerate pain or perform ADLs at home.  She states that she has had multiple sick family members including a husband who previously had cancer, and that at her home as well as her sisters home, they have modifications such as Hoyer lift, shower chair, bedside commode, sock puller, and the patient herself has a wheelchair as well as a walker.  I did offer consult with physical therapy/Occupational Therapy which would be available in the morning, however the patient is adamant that she does not want to be hospitalized overnight and would much prefer to go home.  She prefers to have close outpatient follow-up with physical therapy and Occupational Therapy.  She notes that she had left hip arthroplasty 16 years ago and closely follows with this orthopedist, she states that she last saw them 5 days ago.   Patient understands risks and benefits of discharge versus admission, we discussed strict return precautions for any development of worsening pain, severe headache, or any other concerning symptoms.  Patient verbalized understanding and agreed.  Patient discharged in stable condition.  Discussion of Management with other Providers or Support Staff  I discussed the management of this patient with the:  Consultant(s):  3:24 PM Case discussed with orthopedics - Bernardino Aly and Juliene Holm. Request: CT  pelvis w/o contrast, then they will view. If looks stable request ambulate with weight bearing on leg, then repeat xr hip to assess stability. 5:19 PM CT imaging has been  performed, orthopedics has reviewed, they recommend close outpatient follow-up with toe-touch weightbearing.  They do not think that repeat x-ray of the hip is necessary at this time.    Disclaimers: -Portions of this record have been created using Scientist, clinical (histocompatibility and immunogenetics). Dictation errors have been sought, but may not have been identified and corrected. -This note was created in part using the AI scribe software Abridge. While efforts have been made to review and edit the content for accuracy, there may be errors, omissions, or misinterpretations due to automated transcription or summarization.   See chart and resident provider documentation for details.  ____________________________________________     HISTORY     Reason for Visit Fall   HPI  History of Present Illness Rachel [name] is a 68 year old female who presents with hip pain following a fall which occurred shortly prior to arrival.  She notes that shortly prior to arrival, she was helping a family member shake out a table cloth, she took a step down onto her left leg, and fell to her left side, landing on her left hip and hitting the left side of her head.  She denies loss of consciousness.  No she was able to stand and bear weight but with severe pain.  She notes she takes 75 mg tablets of clopidogrel , she takes half every other day.  She denies severe head pain, neck pain, nausea, vomiting, or other extremity injury.  Denies low back pain..    Past Medical History[1]  Problem List[2]  Past Surgical History[3]  No current facility-administered medications for this encounter.  Current Outpatient Medications:  .  oxyCODONE  (ROXICODONE ) 5 MG immediate release tablet, Take 1 tablet (5 mg total) by mouth every eight (8) hours as needed for up to 3 days., Disp: 9 tablet, Rfl: 0  Allergies Guaifenesin (bulk) and Codeine  Family History[4]  Social History Short Social History[5]   PHYSICAL EXAM    ED Triage  Vitals [11/10/23 1217]  Enc Vitals Group     BP 126/60     Pulse 73     SpO2 Pulse      Resp 20     Temp 36.8 C (98.2 F)     Temp Source Oral     SpO2 98 %     Weight 97.1 kg (214 lb)     Height      Head Circumference      Peak Flow      Pain Score      Pain Loc      Pain Education      Exclude from Growth Chart     Physical Exam GENERAL: Alert, cooperative, well developed, no acute distress. HEENT: Normocephalic with a bruise and large hematoma on the back of the head, normal oropharynx, moist mucous membranes. NECK: Normal.  No cervical midline tenderness.  No nuchal rigidity or meningismus. CHEST: Clear to auscultation bilaterally, no wheezes, rhonchi, or crackles. MUSCULOSKELETAL: No pinpoint left anterior hip tenderness.  No shortening or malrotation of the lower extremities.  DP and PT pulses are 2+.  Capillary refills less than 2 seconds. NEUROLOGICAL: Cranial nerves grossly intact, moves all extremities without gross motor or sensory deficit. SKIN: Normal.  RESULTS    Labs   No results found for this visit on 11/10/23.   Radiology  CT Pelvis WO contrast (musculoskeletal) Result Date: 11/10/2023 EXAM: CT PELVIS WO CONTRAST MSK DATE: 11/10/2023 5:21 PM ACCESSION: [id number] UN DICTATED: 11/10/2023 5:23 PM INTERPRETATION LOCATION: MAIN CAMPUS CLINICAL INDICATION: 68 years old Female with left hip fx, ortho request  COMPARISON: Pelvis radiographs 11/10/2023 TECHNIQUE:  A axially-acquired helical CT scan of the pelvis was obtained without contrast. Contiguous transverse, coronal and sagittal images were reconstructed at 3-mm increments. FINDINGS: Redemonstrated acute comminuted and displaced fracture of the left inferior pubic ramus with mild displacement of the middle fracture fragment (2:22). Slightly displaced left acetabular fracture which extends to involve the proximal left superior pubic ramus. (4:39, 2:57). The left hip arthroplasty remains intact. The right hip  is intact. Mild degenerative changes of the bilateral sacroiliac joints. Oval calcified lesion measuring 2 cm in the right posterior hip soft tissues (2:82), likely sequela of remote injury. The intrapelvic contents are unremarkable. Other: Left adnexal cystic lesion measuring up to 2.4 cm   1.  Redemonstrated acute and comminuted displaced left inferior pubic ramus fractures. 2.  Additional acute mildly displaced left acetabular fracture which extends to involve the proximal left superior pubic ramus. 3.  Left hip arthroplasty remains intact.  CT head WO contrast Result Date: 11/10/2023 EXAM: Computed tomography, head or brain without contrast material. ACCESSION: [id number] UN CLINICAL INDICATION: 68 years old Female with head injury, anticoagulated  COMPARISON: None TECHNIQUE: Axial CT images of the head from skull base to vertex without contrast. FINDINGS: MASS EFFECT & VENTRICLES: No shift. The lateral ventricles are symmetric. The ventricles, sulci and cisterns are normal. BRAIN:  The brain parenchyma is normal. No acute infarct, hemorrhage or mass lesion is seen.  VASCULAR:  Cavernous carotids and vertebral vessels are normal. EXTRA-AXIAL:  Extra-axial spaces are normal. EXTRA-CRANIAL:   Skull and facial bones are normal. Sinuses and mastoids are pneumatized and clear. Orbits are normal. Small left posterior occipital scalp hematoma. No intracranial hemorrhage or skull fractures.   No intracranial hemorrhage or skull fracture. . Small left posterior occipital scalp hematoma.   CT Cervical Spine Wo Contrast Result Date: 11/10/2023 EXAM: CT CERVICAL SPINE WO CONTRAST DATE: 11/10/2023 1:43 PM ACCESSION: [id number] UN CLINICAL INDICATION: 68 years old Female with fall, head injury  COMPARISON: None. TECHNIQUE: An axially-acquired helical CT scan of the cervical spine was obtained without contrast.  Transverse, coronal and sagittal images were reconstructed with bone and soft tissue algorithms.  FINDINGS: Evaluation of the lower cervical spine is mildly degraded by high image noise. The skull base through the superior half of the T2 vertebra is imaged on this examination. No fracture is seen. Mild anterolisthesis at C2-C3 and mild anterolisthesis at C5-C6, likely degenerative. C1-C2 arthropathy with mild peri-odontoid calcifications. Multilevel intervertebral disc space narrowing and vertebral endplate degenerative changes most pronounced at C5-C6 and C6-C7. Central disc protrusion at C3-C4, likely partially calcified with mild spinal canal and severe right foraminal narrowing. Multilevel uncovertebral hypertrophy with severe osseous right neural foramen narrowing at C5-C6, moderate right foraminal narrowing at C4-C5 and mild right foraminal narrowing at C6-C7 [place] send left close lungs. Multilevel mild facet arthropathy. Facet joints are approximated. Osseous spinal canal is patent. Prevertebral soft tissues are normal. Multinodular thyroid  gland with multiple calcified nodules. Large exophytic left thyroid  nodule measuring 2.9 x 2.3 cm extending to the thoracic inlet (2:82). Visualized lung apices are normal. Partially visualized left intracranial ICA stent.   No acute fracture or traumatic listhesis of the cervical spine. Degenerative changes of the cervical spine as described with calcified central protrusion  at C3-C4 and multilevel foraminal stenosis. Multinodular thyroid  gland with large exophytic left thyroid  nodule measuring up to 2.9 cm. Recommend thyroid  ultrasound for better characterization if not previously performed.   XR Pelvis 3 Or More Views Result Date: 11/10/2023 EXAM: XR PELVIS 3 OR MORE VIEWS DATE: 11/10/2023 1:33 PM ACCESSION: [id number] UN DICTATED: 11/10/2023 1:35 PM INTERPRETATION LOCATION: Main Campus CLINICAL INDICATION: 68 years old Female with fall    COMPARISON: None. TECHNIQUE: AP, inlet, and outlet views of the pelvis.  FINDINGS: Diffuse osteopenia. Mildly displaced  left inferior pubic ramus fracture. Partially visualized left hip arthroplasty. Bilateral sacroiliac joints and pubic symphysis are approximated. Surgical clips overlying the right pelvis.   Diffuse osteopenia. Mildly displaced left inferior pubic ramus fracture.  XR Trauma Hip Left Result Date: 11/10/2023 EXAM: XR TRAUMA HIP LEFT DATE: 11/10/2023 1:33 PM ACCESSION: [id number] UN DICTATED: 11/10/2023 1:37 PM INTERPRETATION LOCATION: Main Campus CLINICAL INDICATION: 68 years old Female with injury    COMPARISON: None. TECHNIQUE: AP view of the pelvis. AP and cross table lateral views of the left femur. FINDINGS: Diffuse osteopenia. Mildly displaced left inferior pubic ramus fracture. Alignment is normal. Left prior hip arthroplasty. Hardware is intact with no perihardware lucency or fracture. Degenerative changes of the left knee.    Diffuse osteopenia. Mildly displaced left inferior pubic ramus fracture.   Medications administered this visit   @MEDADMIN @ Procedures            [1] Past Medical History: Diagnosis Date  . Aneurysm   [2] There is no problem list on file for this patient.  [3] Past Surgical History: Procedure Laterality Date  . CARPAL TUNNEL RELEASE Bilateral   . CHOLECYSTECTOMY    . HERNIA REPAIR    . REPLACEMENT TOTAL HIP W/  RESURFACING IMPLANTS Left   . REPLACEMENT TOTAL KNEE Right   [4] History reviewed. No pertinent family history. [5] Social History Tobacco Use  . Smoking status: Never  . Smokeless tobacco: Never  Vaping Use  . Vaping status: Never Used  Substance Use Topics  . Alcohol use: Not Currently  . Drug use: Never   Georjean Falling [place], [place] 11/10/23 1843

## 2023-11-10 NOTE — BH Treatment Plan (Signed)
 Orthopaedic Treatment Plan  [facility] ED called regarding this patient.  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 She has been able to stand and bear weight on her left leg, but this caused significant pain. She is closed NVI and pain with hip rotation. At baseline she ambulates unassisted, but was previously recommended to use a walker.  Imaging:  I personally reviewed the imaging which show left segmental pubic ramus fracture.  ASSESSMENT AND PLAN: Rachel [name] is a 68 y.o. female with:  1) Left segmental pubic ramus fracture  - Recommend toe touch weightbearing on LLE - Operative treatment pending CT results and discussion with arthroplasty team   Orthopaedic surgery will arrange outpatient follow-up.  Bernardino SHAUNNA Aly, MD [facility]  *The history and physical examination of the patient is as reported by the ED provider, as I was not present to examine this patient.

## 2023-11-11 NOTE — Unmapped External Note (Signed)
 Entered foot pictures for wrong patient, please remove. Damien Sow, MS4 Medical Student

## 2023-11-14 DIAGNOSIS — S32592A Other specified fracture of left pubis, initial encounter for closed fracture: Secondary | ICD-10-CM | POA: Insufficient documentation

## 2023-11-19 ENCOUNTER — Other Ambulatory Visit: Payer: Self-pay

## 2023-11-19 ENCOUNTER — Emergency Department

## 2023-11-19 ENCOUNTER — Emergency Department
Admission: EM | Admit: 2023-11-19 | Discharge: 2023-11-19 | Disposition: A | Discharge status: Home / Self Care | Attending: Emergency Medicine | Admitting: Emergency Medicine

## 2023-11-19 DIAGNOSIS — J45909 Unspecified asthma, uncomplicated: Secondary | ICD-10-CM | POA: Diagnosis not present

## 2023-11-19 DIAGNOSIS — E039 Hypothyroidism, unspecified: Secondary | ICD-10-CM | POA: Diagnosis not present

## 2023-11-19 DIAGNOSIS — M25552 Pain in left hip: Secondary | ICD-10-CM | POA: Diagnosis present

## 2023-11-19 DIAGNOSIS — X509XXA Other and unspecified overexertion or strenuous movements or postures, initial encounter: Secondary | ICD-10-CM | POA: Insufficient documentation

## 2023-11-19 LAB — CBG MONITORING, ED: Glucose-Capillary: 92 mg/dL (ref 70–99)

## 2023-11-19 MED ORDER — HYDROMORPHONE HCL 1 MG/ML IJ SOLN
0.5000 mg | Freq: Once | INTRAMUSCULAR | Status: AC
  Administered 2023-11-19: 0.5 mg via INTRAVENOUS
  Filled 2023-11-19: qty 0.5

## 2023-11-19 MED ORDER — HYDROCODONE-ACETAMINOPHEN 5-325 MG PO TABS: 1.0000 | ORAL_TABLET | Freq: Four times a day (QID) | ORAL | 0 refills | Status: AC | PRN

## 2023-11-19 NOTE — ED Provider Notes (Signed)
 [facility] Provider Note    Event Date/Time   First MD Initiated Contact with Patient 11/19/23 1219     (approximate)   History   Chief Complaint: Hip Pain   HPI  Rachel [name] is a 68 y.o. female with history of GERD, hypothyroidism who comes ED complaining of left hip pain.  Patient reports that 2 weeks ago she had a fall at home, had left hip pain and was seen at [facility] emergency department.  At that time, she was found to have a comminuted pubic ramus fracture on the left as well as a acetabular fracture.  Orthopedics recommended toe-touch weightbearing and patient was discharged home.  However, 2 days ago patient was at home and felt a pop sensation in her left hip associated with severe pain.  Pain has been constant, she has not been able to support weight on the leg due to the pain.  No chest pain or shortness of breath, no fever        Past Medical History:  Diagnosis Date   Allergic rhinitis    GERD (gastroesophageal reflux disease)    H/O bariatric surgery 10/2016   History of hiatal hernia    History of kidney stones    Hypothyroidism    Migraines    Mild asthma    OA (osteoarthritis)    PONV (postoperative nausea and vomiting)    Recurrent upper respiratory infection (URI)    Renal calculus, bilateral    Wears glasses     Current Outpatient Rx   Order #: [id number] Class: Historical Med   Order #: [id number] Class: Historical Med   Order #: [id number] Class: Historical Med   Order #: [id number] Class: Historical Med   Order #: [id number] Class: Historical Med   Order #: [id number] Class: Historical Med   Order #: [id number] Class: Print   Order #: [id number] Class: Historical Med   Order #: [id number] Class: Historical Med   Order #: [id number] Class: Historical Med   Order #: [id number] Class: Historical Med   Order #: [id number] Class: Historical Med   Order #: [id number] Class: Historical Med   Order #: [id number] Class: Historical Med    Order #: [id number] Class: Historical Med   Order #: [id number] Class: Historical Med   Order #: [id number] Class: Historical Med   Order #: [id number] Class: Historical Med   Order #: [id number] Class: Historical Med   Order #: [id number] Class: Historical Med    Past Surgical History:  Procedure Laterality Date   ANKLE ARTHROSCOPY Left 01-27-2002    dr lesleigh  [facility]   BREAST SURGERY     lumpectomy x 2 - benign    CARPAL TUNNEL RELEASE Bilateral right 08-23-2003   dr sypher [facility]/  left 2006 approx.   CYSTOSCOPY WITH RETROGRADE PYELOGRAM, URETEROSCOPY AND STENT PLACEMENT Bilateral 01/20/2017   Procedure: CYSTOSCOPY WITH RETROGRADE PYELOGRAM, URETEROSCOPY AND STENT PLACEMENT;  Surgeon: Sherrilee Belvie CROME, MD;  Location: [facility];  Service: Urology;  Laterality: Bilateral;   CYSTOSCOPY/URETEROSCOPY/HOLMIUM LASER/STENT PLACEMENT Left 12/10/2022   Procedure: FIRST STAGE LEFT URETEROSCOPY/HOLMIUM LASER/RETROGRADE PYELOGRAM/STENT PLACEMENT;  Surgeon: Lovie Arlyss CROME, MD;  Location: [facility];  Service: Urology;  Laterality: Left;  90 MINUTES NEEDED FOR CASE   CYSTOSCOPY/URETEROSCOPY/HOLMIUM LASER/STENT PLACEMENT Left 12/24/2022   Procedure: SECOND STAGE LEFT  URETEROSCOPY/RETROGRADE PYELOGRAM/HOLMIUM LASER/LEFT STENT EXCHANGED;  Surgeon: Lovie Arlyss CROME, MD;  Location: [facility];  Service: Urology;  Laterality: Left;  90 MINUTES NEEDED FOR CASE   DILATION AND CURETTAGE OF UTERUS     several  ESOPHAGOGASTRODUODENOSCOPY  09/2016   HOLMIUM LASER APPLICATION Bilateral 01/20/2017   Procedure: HOLMIUM LASER APPLICATION;  Surgeon: Sherrilee Belvie CROME, MD;  Location: [facility];  Service: Urology;  Laterality: Bilateral;   IR 3D INDEPENDENT WKST  07/28/2023   IR ANGIO INTRA EXTRACRAN SEL COM CAROTID INNOMINATE UNI R MOD SED  07/28/2023   IR ANGIO INTRA EXTRACRAN SEL INTERNAL CAROTID UNI L MOD SED  07/28/2023   IR ANGIOGRAM FOLLOW UP STUDY  07/30/2023    IR CT HEAD LTD  07/28/2023   IR NEURO EACH ADD'L AFTER BASIC UNI LEFT (MS)  07/30/2023   IR RADIOLOGIST EVAL & MGMT  07/04/2023   IR RADIOLOGIST EVAL & MGMT  08/13/2023   IR TRANSCATH/EMBOLIZ  07/28/2023   KNEE ARTHROSCOPY WITH MEDIAL MENISECTOMY Right 06/08/2013   Procedure: RIGHT KNEE ARTHROSCOPY WITH LATERAL MENISECTOMY with ABRASION CHRONDROPLASTY and SUPRAPATELLAR SYNOVECTOMY;  Surgeon: Tanda DELENA Heading, MD;  Location: [facility];  Service: Orthopedics;  Laterality: Right;   LAPAROSCOPIC CHOLECYSTECTOMY  1996   LAPAROSCOPIC GASTRIC RESTRICTIVE DUODENAL PROCEDURE (DUODENAL SWITCH)  11-11-2016     [facility] in [place] , [place]   AND HIATAL HERNIA REPAIR   RADIOLOGY WITH ANESTHESIA N/A 07/28/2023   Procedure: RADIOLOGY WITH ANESTHESIA;  Surgeon: Dolphus Carrion, MD;  Location: [facility];  Service: Radiology;  Laterality: N/A;  Diagnostic angiogram w/intent to treat aneurysm   TONSILLECTOMY  child   TOTAL HIP ARTHROPLASTY Left 04-02-2006   dr heading   [facility]   TOTAL KNEE ARTHROPLASTY Right 01/25/2015   Procedure: TOTAL KNEE ARTHROPLASTY;  Surgeon: Tanda Heading, MD;  Location: [facility];  Service: Orthopedics;  Laterality: Right;    Physical Exam   Triage Vital Signs: ED Triage Vitals  Encounter Vitals Group     BP 11/19/23 1215 130/64     Girls Systolic BP Percentile --      Girls Diastolic BP Percentile --      Boys Systolic BP Percentile --      Boys Diastolic BP Percentile --      Pulse Rate 11/19/23 1215 67     Resp 11/19/23 1215 17     Temp 11/19/23 1215 98 F (36.7 C)     Temp src --      SpO2 11/19/23 1215 97 %     Weight 11/19/23 1214 218 lb (98.9 kg)     Height 11/19/23 1214 5' 7 (1.702 m)     Head Circumference --      Peak Flow --      Pain Score 11/19/23 1214 9     Pain Loc --      Pain Education --      Exclude from Growth Chart --     Most recent vital signs: Vitals:   11/19/23 1330 11/19/23 1400  BP: 130/73 107/65  Pulse: 61 68  Resp:  18  Temp:    SpO2: 97% 97%     General: Awake, no distress.  CV:  Good peripheral perfusion.  Regular rate rhythm, normal DP pulses Resp:  Normal effort.  Abd:  No distention.  Soft nontender Other:  Tenderness at the left hip.  No leg shortening or rotation   ED Results / Procedures / Treatments   Labs (all labs ordered are listed, but only abnormal results are displayed) Labs Reviewed  CBG MONITORING, ED     EKG    RADIOLOGY X-ray left hip interpreted by me, shows irregularity of the left acetabulum consistent with fracture seen on  recent CT.  Radiology report reviewed  CT left hip pending   PROCEDURES:  Procedures   MEDICATIONS ORDERED IN ED: Medications  HYDROmorphone  (DILAUDID ) injection 0.5 mg (0.5 mg Intravenous Given 11/19/23 1237)     IMPRESSION / MDM / ASSESSMENT AND PLAN / ED COURSE  I reviewed the triage vital signs and the nursing notes.  DDx: Left hip dislocation, periprosthetic fracture, acetabular fracture, pubic ramus fracture, muscle spasm  Patient's presentation is most consistent with acute presentation with potential threat to life or bodily function.  Patient presents with increased left hip pain after recently diagnosed acetabular fracture that was planned for outpatient follow-up.  Patient has good pain relief with IV Dilaudid .  X-ray suggest some worsening of the acetabular fracture, CT hip recommended by radiology.      FINAL CLINICAL IMPRESSION(S) / ED DIAGNOSES   Final diagnoses:  Left hip pain     Rx / DC Orders   ED Discharge Orders     None        Note:  This document was prepared using Dragon voice recognition software and may include unintentional dictation errors.   Viviann Pastor, MD 11/19/23 [phone]

## 2023-11-19 NOTE — ED Triage Notes (Signed)
 Pt via [facility] from home for L hip pain. pt fell 2 weeks ago with fractured pelvis. pt felt something pop 2 days ago in L hip upon movement. pt unable to bear weight on L leg. EMS denies shortening.  BP 112/61 CBG 61 HR 70 SPO2 100%  EMS gave 75mcg fentanyl 

## 2023-11-19 NOTE — ED Notes (Signed)
 Pt requesting information about inpatient rehab. Pt states she had outpatient therapy set up but was unable to get into a car to get to therapy. Pt concerned that she is not getting therapy. RN discussed situation with MD. Patient reports she has all the neccessary equipment to care for herself with the help of her sister. Pt educated to reach out to provider that arranged therapy or PCP to facilitate OT and PT. Patient agreeable and denies any questions at this time.

## 2023-11-19 NOTE — ED Notes (Signed)
 [facility] called for  transport  home

## 2023-11-19 NOTE — Discharge Instructions (Addendum)
 You were seen in the ER today for evaluation of your hip pain. Your testing today redemonstrated the fractures of your pelvis and hip that were seen at your recent visit at [facility].  We did not see any new injuries today.  Please keep your scheduled follow-up with the orthopedics team.  Return to the ER for any new or worsening symptoms.  I have sent a prescription for a narcotic pain medicine for severe breathrough pain.  Do not take this with other narcotic medicine such as tramadol  or oxycodone .  Do not drink alcohol, drive or participate in any other potentially dangerous activities while taking this medication as it may make you sleepy. Do not take this medication with any other sedating medications, either prescription or over-the-counter.  This medication is intended for your use only - do not give any to anyone else and keep it in a secure place where nobody else, especially children, have access to it.  It can also cause or worsen constipation, so you may want to consider taking an over-the-counter stool softener while you are taking this medication.

## 2023-11-19 NOTE — ED Provider Notes (Signed)
 Care of this patient assumed from prior physician at 1500 pending CT and disposition. Please see prior physician note for further details.  Briefly this is a 68 year old female presenting hip pain.  Had a recently seen 10/20 at [facility]. Noted to have comminuted pubic rami fracture and left acetabular fracture, discharged with plans for outpatient follow-up.  2 days ago felt popping sensation.   Hip x-Vinia Jemmott here with possible subacute pubic rami irregularity and left acetabular fracture when compared to imaging before her recent fall.  CT ordered to further evaluate. Signed out to me pending this. If no significant new abnormalities noted, anticipated patient will be stable for discharge with continued outpatient follow-up with orthopedics in the [facility] system.   I am unable to see the images from the [facility] system, but I copied the impression for her CT pelvis below: IMPRESSION:  1. Redemonstrated acute and comminuted displaced left inferior pubic ramus fractures.  2.  Additional acute mildly displaced left acetabular fracture which extends to involve the proximal left superior pubic ramus.  3.  Left hip arthroplasty remains intact.   CT hip today demonstrates acute left acetabular fracture with acute fracture of the inferior pubic rami.  This is consistent with injuries noted on radiology report of CT pelvis performed 10/20 at [facility].  Radiology also notes erosion along the greater trochanter with synovitis with consideration for particulate disease.  Discussed results of CT scan with patient.  Her injuries noted on CT scan today are consistent with those previously noted.  This had been discussed with [facility] orthopedics at her prior ER visit who recommended toe-touch weightbearing and outpatient follow-up.  Do think it is reasonable to continue this plan. She has been on oxycodone  and tramadol  and reports limited benefit with this.  Discussed trial of Norco though we discussed that this is in the  same class of medicines as her other medications and should not be taken together.  Patient is comfortable with plan for discharge with continued outpatient follow-up.  Strict return precautions provided.  Patient discharged stable condition.     Levander Slate, MD 11/19/23 [phone]

## 2023-11-20 ENCOUNTER — Ambulatory Visit (HOSPITAL_COMMUNITY)

## 2023-11-20 ENCOUNTER — Encounter (HOSPITAL_COMMUNITY): Payer: Self-pay

## 2023-12-03 ENCOUNTER — Other Ambulatory Visit: Payer: Self-pay | Admitting: Neurosurgery

## 2023-12-03 DIAGNOSIS — I671 Cerebral aneurysm, nonruptured: Secondary | ICD-10-CM

## 2023-12-23 LAB — PLATELET INHIBITION P2Y12

## 2024-01-26 ENCOUNTER — Other Ambulatory Visit: Payer: Self-pay | Admitting: Obstetrics & Gynecology

## 2024-01-26 DIAGNOSIS — R928 Other abnormal and inconclusive findings on diagnostic imaging of breast: Secondary | ICD-10-CM

## 2024-02-06 ENCOUNTER — Ambulatory Visit (HOSPITAL_COMMUNITY)
Admission: RE | Admit: 2024-02-06 | Discharge: 2024-02-06 | Disposition: A | Discharge status: Home / Self Care | Source: Ambulatory Visit | Attending: Neurosurgery | Admitting: Neurosurgery

## 2024-02-06 DIAGNOSIS — I671 Cerebral aneurysm, nonruptured: Secondary | ICD-10-CM

## 2024-02-06 NOTE — H&P (Signed)
 " Chief Complaint   Aneurysm  History of Present Illness  Rachel [name] is a 69 y.o. 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 Monna.  After his retirement I have seen her for follow-up.  She remains on every other day dosing of the 7.5 mg Plavix  and daily baby aspirin .  She is otherwise well.  Past Medical History   Past Medical History:  Diagnosis Date   Allergic rhinitis    GERD (gastroesophageal reflux disease)    H/O bariatric surgery 10/2016   History of hiatal hernia    History of kidney stones    Hypothyroidism    Migraines    Mild asthma    OA (osteoarthritis)    PONV (postoperative nausea and vomiting)    Recurrent upper respiratory infection (URI)    Renal calculus, bilateral    Wears glasses     Past Surgical History   Past Surgical History:  Procedure Laterality Date   ANKLE ARTHROSCOPY Left 01-27-2002    dr lesleigh  [facility]   BREAST SURGERY     lumpectomy x 2 - benign    CARPAL TUNNEL RELEASE Bilateral right 08-23-2003   dr sypher [facility]/  left 2006 approx.   CYSTOSCOPY WITH RETROGRADE PYELOGRAM, URETEROSCOPY AND STENT PLACEMENT Bilateral 01/20/2017   Procedure: CYSTOSCOPY WITH RETROGRADE PYELOGRAM, URETEROSCOPY AND STENT PLACEMENT;  Surgeon: Sherrilee Belvie CROME, MD;  Location: [facility];  Service: Urology;  Laterality: Bilateral;   CYSTOSCOPY/URETEROSCOPY/HOLMIUM LASER/STENT PLACEMENT Left 12/10/2022   Procedure: FIRST STAGE LEFT URETEROSCOPY/HOLMIUM LASER/RETROGRADE PYELOGRAM/STENT PLACEMENT;  Surgeon: Lovie Arlyss CROME, MD;  Location: [facility];  Service: Urology;  Laterality: Left;  90 MINUTES NEEDED FOR CASE   CYSTOSCOPY/URETEROSCOPY/HOLMIUM LASER/STENT PLACEMENT Left 12/24/2022   Procedure: SECOND STAGE LEFT  URETEROSCOPY/RETROGRADE PYELOGRAM/HOLMIUM LASER/LEFT STENT EXCHANGED;  Surgeon: Lovie Arlyss CROME, MD;  Location: [facility];  Service:  Urology;  Laterality: Left;  90 MINUTES NEEDED FOR CASE   DILATION AND CURETTAGE OF UTERUS     several    ESOPHAGOGASTRODUODENOSCOPY  09/2016   HOLMIUM LASER APPLICATION Bilateral 01/20/2017   Procedure: HOLMIUM LASER APPLICATION;  Surgeon: Sherrilee Belvie CROME, MD;  Location: [facility];  Service: Urology;  Laterality: Bilateral;   IR 3D INDEPENDENT WKST  07/28/2023   IR ANGIO INTRA EXTRACRAN SEL COM CAROTID INNOMINATE UNI R MOD SED  07/28/2023   IR ANGIO INTRA EXTRACRAN SEL INTERNAL CAROTID UNI L MOD SED  07/28/2023   IR ANGIOGRAM FOLLOW UP STUDY  07/30/2023   IR CT HEAD LTD  07/28/2023   IR NEURO EACH ADD'L AFTER BASIC UNI LEFT (MS)  07/30/2023   IR RADIOLOGIST EVAL & MGMT  07/04/2023   IR RADIOLOGIST EVAL & MGMT  08/13/2023   IR TRANSCATH/EMBOLIZ  07/28/2023   KNEE ARTHROSCOPY WITH MEDIAL MENISECTOMY Right 06/08/2013   Procedure: RIGHT KNEE ARTHROSCOPY WITH LATERAL MENISECTOMY with ABRASION CHRONDROPLASTY and SUPRAPATELLAR SYNOVECTOMY;  Surgeon: Tanda DELENA Heading, MD;  Location: [facility];  Service: Orthopedics;  Laterality: Right;   LAPAROSCOPIC CHOLECYSTECTOMY  1996   LAPAROSCOPIC GASTRIC RESTRICTIVE DUODENAL PROCEDURE (DUODENAL SWITCH)  11-11-2016     [facility] in [place] , [place]   AND HIATAL HERNIA REPAIR   RADIOLOGY WITH ANESTHESIA N/A 07/28/2023   Procedure: RADIOLOGY WITH ANESTHESIA;  Surgeon: Dolphus Carrion, MD;  Location: [facility];  Service: Radiology;  Laterality: N/A;  Diagnostic angiogram w/intent to treat aneurysm   TONSILLECTOMY  child   TOTAL  HIP ARTHROPLASTY Left 04-02-2006   dr heide   [facility]   TOTAL KNEE ARTHROPLASTY Right 01/25/2015   Procedure: TOTAL KNEE ARTHROPLASTY;  Surgeon: Tanda Heide, MD;  Location: [facility];  Service: Orthopedics;  Laterality: Right;    Social History  Social History[1]  Medications   Prior to Admission medications  Medication Sig Start Date End Date Taking? Authorizing Provider  acetaminophen  (TYLENOL ) 500 MG tablet Take 1,000 mg by mouth every 6  (six) hours as needed for mild pain.    [provider]  albuterol  (PROVENTIL ) (2.5 MG/3ML) 0.083% nebulizer solution Inhale 3 mLs into the lungs every 6 (six) hours as needed for shortness of breath.    [provider]  albuterol  (VENTOLIN  HFA) 108 (90 Base) MCG/ACT inhaler Inhale 1-2 puffs into the lungs every 4 (four) hours as needed for wheezing or shortness of breath.    [provider]  aspirin  EC 81 MG tablet Take 81 mg by mouth daily. Swallow whole.    [provider]  atorvastatin  (LIPITOR) 10 MG tablet Take 10 mg by mouth daily. 02/16/20   [provider]  calcium  citrate-vitamin D  500-500 MG-UNIT chewable tablet Chew 2 tablets by mouth daily.    [provider]  clopidogrel  (PLAVIX ) 75 MG tablet Take 1 tablet (75 mg total) by mouth daily. You were instructed to continue at 75mg  every day and then transition to 37.5 (1/2 tab daily) by the office on a specific schedule. Please call if you have questions. 08/19/23   Huneycutt, Brittany, NP  EPINEPHrine  0.3 mg/0.3 mL IJ SOAJ injection Inject 0.3 mg into the muscle as needed for anaphylaxis.     [provider]  FLUoxetine  (PROZAC ) 40 MG capsule Take 1 capsule by mouth daily.    [provider]  fluticasone  (FLONASE ) 50 MCG/ACT nasal spray Place 2 sprays into both nostrils at bedtime.     [provider]  gabapentin  (NEURONTIN ) 300 MG capsule Take 300 mg by mouth 3 (three) times daily. 12/24/18   [provider]  levothyroxine  (SYNTHROID ) 50 MCG tablet Take 50 mcg by mouth daily before breakfast.    [provider]  loratadine  (CLARITIN ) 10 MG tablet Take 10 mg by mouth daily.    [provider]  meclizine (ANTIVERT) 12.5 MG tablet Take 12.5 mg by mouth 2 (two) times daily as needed for dizziness.    [provider]  Melatonin 10 MG TABS Take 10 mg by mouth at bedtime.    [provider]  Multiple Vitamin (MULTIVITAMIN)  tablet Take 1 tablet by mouth 3 (three) times daily with meals. Bariatric MV    [provider]  pantoprazole  (PROTONIX ) 40 MG tablet Take 40 mg by mouth daily. 01/12/20   [provider]  Probiotic Product (PROBIOTIC DAILY PO) Take 2 capsules by mouth daily.    [provider]  tizanidine (ZANAFLEX) 2 MG capsule Take 2 mg by mouth 3 (three) times daily as needed for muscle spasms.    [provider]  traZODone  (DESYREL ) 50 MG tablet Take 50 mg by mouth at bedtime.    [provider]    Allergies  Allergies[2]  Review of Systems  ROS  Neurologic Exam  Awake, alert, oriented Memory and concentration grossly intact Speech fluent, appropriate CN grossly intact Motor exam: Upper Extremities Deltoid Bicep Tricep Grip  Right 5/5 5/5 5/5 5/5  Left 5/5 5/5 5/5 5/5   Lower Extremities IP Quad PF DF EHL  Right 5/5 5/5  5/5 5/5 5/5  Left 5/5 5/5 5/5 5/5 5/5   Sensation grossly intact to LT  Impression  - 69 y.o. 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 We have also reviewed in detail the risks, benefits, and alternatives to the procedure. All questions were answered and Rachel [name] provided informed consent to proceed.  Rachel Maizes, MD [facility]       [1]  Social History Tobacco Use   Smoking status: Never   Smokeless tobacco: Never  Vaping Use   Vaping status: Never Used  Substance Use Topics   Alcohol use: Not Currently   Drug use: No  [2]  Allergies Allergen Reactions   Bee Venom Anaphylaxis   Codeine Itching   Guaifenesin & Derivatives Anaphylaxis   Molds &  Smuts Shortness Of Breath    Nose will burn   Pollen Extract     Eye Irritation Itchy Eyes, Burning Eyes, Sneezing   "

## 2024-02-07 ENCOUNTER — Ambulatory Visit
Admission: RE | Admit: 2024-02-07 | Discharge: 2024-02-07 | Disposition: A | Discharge status: Home / Self Care | Source: Ambulatory Visit | Attending: Obstetrics & Gynecology | Admitting: Obstetrics & Gynecology

## 2024-02-07 DIAGNOSIS — N6320 Unspecified lump in the left breast, unspecified quadrant: Secondary | ICD-10-CM

## 2024-02-07 DIAGNOSIS — R928 Other abnormal and inconclusive findings on diagnostic imaging of breast: Secondary | ICD-10-CM

## 2024-02-10 ENCOUNTER — Encounter

## 2024-02-10 ENCOUNTER — Other Ambulatory Visit: Payer: Self-pay | Admitting: Neurosurgery

## 2024-02-10 ENCOUNTER — Other Ambulatory Visit

## 2024-02-10 DIAGNOSIS — I671 Cerebral aneurysm, nonruptured: Secondary | ICD-10-CM

## 2024-02-12 ENCOUNTER — Ambulatory Visit
Admission: RE | Admit: 2024-02-12 | Discharge: 2024-02-12 | Disposition: A | Discharge status: Home / Self Care | Source: Ambulatory Visit | Attending: Obstetrics & Gynecology | Admitting: Obstetrics & Gynecology

## 2024-02-12 ENCOUNTER — Inpatient Hospital Stay: Admission: RE | Admit: 2024-02-12

## 2024-02-12 ENCOUNTER — Ambulatory Visit: Admitting: Neurology

## 2024-02-12 DIAGNOSIS — R928 Other abnormal and inconclusive findings on diagnostic imaging of breast: Secondary | ICD-10-CM

## 2024-02-12 DIAGNOSIS — N6320 Unspecified lump in the left breast, unspecified quadrant: Secondary | ICD-10-CM

## 2024-02-27 DIAGNOSIS — M4726 Other spondylosis with radiculopathy, lumbar region: Secondary | ICD-10-CM | POA: Insufficient documentation

## 2024-02-27 DIAGNOSIS — J301 Allergic rhinitis due to pollen: Secondary | ICD-10-CM | POA: Insufficient documentation

## 2024-02-27 DIAGNOSIS — D692 Other nonthrombocytopenic purpura: Secondary | ICD-10-CM | POA: Insufficient documentation

## 2024-02-27 DIAGNOSIS — K5730 Diverticulosis of large intestine without perforation or abscess without bleeding: Secondary | ICD-10-CM | POA: Insufficient documentation

## 2024-02-27 DIAGNOSIS — J4520 Mild intermittent asthma, uncomplicated: Secondary | ICD-10-CM | POA: Insufficient documentation

## 2024-02-27 DIAGNOSIS — K429 Umbilical hernia without obstruction or gangrene: Secondary | ICD-10-CM | POA: Insufficient documentation

## 2024-02-27 DIAGNOSIS — M169 Osteoarthritis of hip, unspecified: Secondary | ICD-10-CM | POA: Insufficient documentation

## 2024-02-27 DIAGNOSIS — T63441A Toxic effect of venom of bees, accidental (unintentional), initial encounter: Secondary | ICD-10-CM | POA: Insufficient documentation

## 2024-02-27 DIAGNOSIS — F5101 Primary insomnia: Secondary | ICD-10-CM | POA: Insufficient documentation

## 2024-02-27 DIAGNOSIS — N200 Calculus of kidney: Secondary | ICD-10-CM | POA: Insufficient documentation

## 2024-02-27 DIAGNOSIS — M8580 Other specified disorders of bone density and structure, unspecified site: Secondary | ICD-10-CM | POA: Insufficient documentation

## 2024-02-27 DIAGNOSIS — E781 Pure hyperglyceridemia: Secondary | ICD-10-CM | POA: Insufficient documentation

## 2024-02-27 DIAGNOSIS — F419 Anxiety disorder, unspecified: Secondary | ICD-10-CM | POA: Insufficient documentation

## 2024-02-27 DIAGNOSIS — I700 Atherosclerosis of aorta: Secondary | ICD-10-CM | POA: Insufficient documentation

## 2024-02-27 DIAGNOSIS — M47817 Spondylosis without myelopathy or radiculopathy, lumbosacral region: Secondary | ICD-10-CM | POA: Insufficient documentation

## 2024-02-27 DIAGNOSIS — N949 Unspecified condition associated with female genital organs and menstrual cycle: Secondary | ICD-10-CM | POA: Insufficient documentation

## 2024-02-27 DIAGNOSIS — K2930 Chronic superficial gastritis without bleeding: Secondary | ICD-10-CM | POA: Insufficient documentation

## 2024-02-27 NOTE — Progress Notes (Unsigned)
"       ° °  LILLETTE Ileana Collet, PhD, LAT, ATC acting as a scribe for Artist Lloyd, MD.  DENEISE GETTY is a 69 y.o. female who presents to [facility] at [facility] today for osteoporosis management.  DEXA scan (date, T-score): 01/20/24: Spine= -0.5, R-hip= -2.4, Total hip= -2.5 Prior treatment: *** History of Hip, Spine, or Wrist Fx: yes: pelvis- Oct 2025*** Heart disease or stroke: *** Cancer: *** Kidney Disease: *** Gastric/Peptic Ulcer: *** Gastric bypass surgery: sleeve gastrectomy,*** Severe GERD: yes Hx of seizures: *** Age at Menopause: *** Calcium  intake: *** Vitamin D  intake: *** Hormone replacement therapy: *** Smoking history: never smoked Alcohol: *** Exercise: *** Major dental work in past year: *** Parents with hip/spine fracture: *** Height loss: ***   Pertinent review of systems: ***  Relevant historical information: ***   Exam:  LMP 06/08/2009  General: Well Developed, well nourished, and in no acute distress.   MSK: ***    Lab and Radiology Results No results found for this or any previous visit (from the past 72 hours). No results found.     Assessment and Plan: 69 y.o. female with ***   PDMP not reviewed this encounter. No orders of the defined types were placed in this encounter.  No orders of the defined types were placed in this encounter.    Discussed warning signs or symptoms. Please see discharge instructions. Patient expresses understanding.   ***  "

## 2024-03-10 ENCOUNTER — Ambulatory Visit: Admitting: Family Medicine

## 2024-03-12 ENCOUNTER — Other Ambulatory Visit (HOSPITAL_COMMUNITY)
